# Patient Record
Sex: Female | Born: 1985
Health system: Southern US, Community
[De-identification: ages and names within clinical notes are randomized; demographics above are authoritative.]

## PROBLEM LIST (undated history)

## (undated) DIAGNOSIS — Z85850 Personal history of malignant neoplasm of thyroid: Secondary | ICD-10-CM

## (undated) DIAGNOSIS — F410 Panic disorder [episodic paroxysmal anxiety] without agoraphobia: Secondary | ICD-10-CM

## (undated) DIAGNOSIS — L0293 Carbuncle, unspecified: Secondary | ICD-10-CM

## (undated) DIAGNOSIS — L0292 Furuncle, unspecified: Secondary | ICD-10-CM

## (undated) DIAGNOSIS — Z91018 Allergy to other foods: Secondary | ICD-10-CM

## (undated) DIAGNOSIS — C73 Malignant neoplasm of thyroid gland: Secondary | ICD-10-CM

## (undated) DIAGNOSIS — R002 Palpitations: Secondary | ICD-10-CM

## (undated) DIAGNOSIS — M549 Dorsalgia, unspecified: Secondary | ICD-10-CM

## (undated) DIAGNOSIS — Z8489 Family history of other specified conditions: Secondary | ICD-10-CM

## (undated) DIAGNOSIS — N761 Subacute and chronic vaginitis: Secondary | ICD-10-CM

## (undated) DIAGNOSIS — E559 Vitamin D deficiency, unspecified: Secondary | ICD-10-CM

## (undated) DIAGNOSIS — K5900 Constipation, unspecified: Secondary | ICD-10-CM

## (undated) DIAGNOSIS — K219 Gastro-esophageal reflux disease without esophagitis: Secondary | ICD-10-CM

## (undated) DIAGNOSIS — D649 Anemia, unspecified: Secondary | ICD-10-CM

## (undated) DIAGNOSIS — F419 Anxiety disorder, unspecified: Secondary | ICD-10-CM

## (undated) DIAGNOSIS — T7840XA Allergy, unspecified, initial encounter: Secondary | ICD-10-CM

## (undated) HISTORY — DX: Malignant neoplasm of thyroid gland: C73

## (undated) HISTORY — DX: Carbuncle, unspecified: L02.93

## (undated) HISTORY — DX: Personal history of malignant neoplasm of thyroid: Z85.850

## (undated) HISTORY — DX: Allergy, unspecified, initial encounter: T78.40XA

## (undated) HISTORY — DX: Constipation, unspecified: K59.00

## (undated) HISTORY — DX: Palpitations: R00.2

## (undated) HISTORY — DX: Anxiety disorder, unspecified: F41.9

## (undated) HISTORY — DX: Anemia, unspecified: D64.9

## (undated) HISTORY — DX: Vitamin D deficiency, unspecified: E55.9

## (undated) HISTORY — DX: Subacute and chronic vaginitis: N76.1

## (undated) HISTORY — DX: Furuncle, unspecified: L02.92

## (undated) HISTORY — PX: ANTERIOR CRUCIATE LIGAMENT REPAIR: SHX115

## (undated) HISTORY — DX: Allergy to other foods: Z91.018

## (undated) HISTORY — DX: Dorsalgia, unspecified: M54.9

---

## 2007-07-08 LAB — HM HIV SCREENING LAB: HM HIV Screening: NEGATIVE

## 2007-07-16 ENCOUNTER — Emergency Department (HOSPITAL_COMMUNITY): Admission: EM | Admit: 2007-07-16 | Discharge: 2007-07-16 | Payer: Self-pay | Admitting: Family Medicine

## 2010-02-20 ENCOUNTER — Ambulatory Visit: Payer: Self-pay | Admitting: Sports Medicine

## 2010-02-20 DIAGNOSIS — M25569 Pain in unspecified knee: Secondary | ICD-10-CM | POA: Insufficient documentation

## 2010-02-20 DIAGNOSIS — M79609 Pain in unspecified limb: Secondary | ICD-10-CM | POA: Insufficient documentation

## 2010-02-20 DIAGNOSIS — E663 Overweight: Secondary | ICD-10-CM | POA: Insufficient documentation

## 2010-02-26 ENCOUNTER — Ambulatory Visit: Payer: Self-pay | Admitting: Family Medicine

## 2010-03-19 ENCOUNTER — Ambulatory Visit: Payer: Self-pay | Admitting: Family Medicine

## 2010-04-02 ENCOUNTER — Ambulatory Visit: Payer: Self-pay | Admitting: Family Medicine

## 2010-04-16 ENCOUNTER — Ambulatory Visit: Payer: Self-pay | Admitting: Family Medicine

## 2010-04-19 ENCOUNTER — Emergency Department (HOSPITAL_COMMUNITY): Admission: EM | Admit: 2010-04-19 | Discharge: 2010-04-19 | Payer: Self-pay | Admitting: Family Medicine

## 2010-04-26 ENCOUNTER — Ambulatory Visit: Payer: Self-pay

## 2010-04-26 ENCOUNTER — Encounter: Payer: Self-pay | Admitting: Cardiology

## 2010-04-30 ENCOUNTER — Ambulatory Visit: Payer: Self-pay | Admitting: Family Medicine

## 2010-05-16 ENCOUNTER — Ambulatory Visit: Payer: Self-pay | Admitting: Family Medicine

## 2010-06-11 ENCOUNTER — Ambulatory Visit: Payer: Self-pay | Admitting: Family Medicine

## 2010-07-04 ENCOUNTER — Ambulatory Visit: Payer: Self-pay | Admitting: Family Medicine

## 2010-07-11 ENCOUNTER — Emergency Department (HOSPITAL_COMMUNITY): Admission: EM | Admit: 2010-07-11 | Discharge: 2010-07-11 | Payer: Self-pay | Admitting: Emergency Medicine

## 2010-07-18 ENCOUNTER — Ambulatory Visit: Payer: Self-pay | Admitting: Family Medicine

## 2010-08-05 ENCOUNTER — Encounter: Payer: Self-pay | Admitting: Family Medicine

## 2010-08-06 ENCOUNTER — Encounter: Payer: Self-pay | Admitting: Family Medicine

## 2010-08-20 ENCOUNTER — Ambulatory Visit: Payer: Self-pay | Admitting: Family Medicine

## 2010-09-04 ENCOUNTER — Encounter: Payer: Self-pay | Admitting: Family Medicine

## 2010-09-24 ENCOUNTER — Encounter: Payer: Self-pay | Admitting: Family Medicine

## 2010-10-23 ENCOUNTER — Emergency Department (HOSPITAL_COMMUNITY)
Admission: EM | Admit: 2010-10-23 | Discharge: 2010-10-23 | Payer: Self-pay | Source: Home / Self Care | Admitting: Family Medicine

## 2010-10-28 LAB — POCT URINALYSIS DIPSTICK
Bilirubin Urine: NEGATIVE
Ketones, ur: NEGATIVE mg/dL
Protein, ur: 100 mg/dL — AB

## 2010-10-28 LAB — URINE CULTURE: Culture  Setup Time: 201201181335

## 2010-10-28 LAB — POCT PREGNANCY, URINE: Preg Test, Ur: NEGATIVE

## 2010-11-05 NOTE — Assessment & Plan Note (Signed)
Summary: np to see Dr. Youlanda Mighty   Vital Signs:  Patient profile:   25 year old female Height:      64 inches Weight:      247.7 pounds BMI:     42.67  Vitals Entered By: Wyona Almas PHD (Feb 26, 2010 11:45 AM)  History of Present Illness: Assessment:  Spent 60 minutes w/ pt.  Lisa Bolton is participating in the [employer] Loss Challenge together with friend Toni Amend [place].  They have recently started exercising; usual exercise includes 2 days of 45 min cardio and 45-min body pump class 2 X wk.  She eats breakfast only  ~3 days a week, does eat lunch & dinner daily, and seldom snacks.  Daily foods/beverages include water, sandwich for lunch, and frozen yogurt  ~3 X wk.  Med's include Lexapro, MVM with Fe.  24-hr recall was atypical in that she ate more than usual, and snacked while staying up late working on a project: B- none; Snk (10:30 AM)- granola bar, 90-kcal Coke; L (1 PM)- [employer] 6" sub: chx, veg's, vinegar, cheese on whwht,  ~32 Sunchips (280 kcal); Snk (5 PM)- 16 oz Coke; D (7:30 PM)- 2 c pasta in olive oil, 1grilled sausage, salad w/ Caesar & croutons, choc cupcake; Snk (10:30 PM)- coffee w/ crm & sugar; Snk (12 AM)- <1 c "pb pretzel sandwiches."  Estimated intake  ~2500 kcal.  Leni said she usually experiences hunger  ~noon and 5 PM.    Nutrition Diagnosis:  Physical inactivity (NB-2.1) related to wt loss goals as evidenced by reported exercise of 4 X wk.  Excessive energy intake (NI-1.5) related to expenditure as evidenced by intake of  ~2500 kcal.    Intervention: See Patient Instructions.    Monitoring/Eval: Dietary intake, body weight, and exercise at 2-wk F/U.     Allergies: No Known Drug Allergies   Other Orders: No Charge Patient Arrived (NCPA0) (NCPA0)  Patient Instructions: 1)  Program on Mindful Eating and Living:  Wed, May 25, 7 PM at [employer], [address]   2)  Food Plan:  9 BREAD/STARCH, 4 VEGETABLES, 8 oz MEAT, 3 FRUIT, 2 MILK, 5  FATS. 3)  Design some menus using these food plans.  Bring back to your next appt for review/modification.  4)  Appt:  June 7 at 2:00 PM.   5)  At subsequent appts, we will explore emotional eating, physical activity, and appetite management.

## 2010-11-05 NOTE — Procedures (Signed)
Summary: summary report  summary report   Imported By: Mirna Mires 04/30/2010 09:56:45  _____________________________________________________________________  External Attachment:    Type:   Image     Comment:   External Document  Appended Document: summary report FAXED TO DR. TISOVEC

## 2010-11-05 NOTE — Assessment & Plan Note (Signed)
Summary: WELLNESS PHYSICAL FOR [facility] FITNESS PROJECT   Vital Signs:  Patient profile:   25 year old female Height:      64 inches Weight:      246 pounds BMI:     42.38 BP sitting:   118 / 77  Vitals Entered By: Lillia Pauls CMA (Feb 20, 2010 9:47 AM)  History of Present Illness: Lisa Bolton is here for SM eval to participate in fitness and wt loss program w [facility] 2002 ACL tear at dance repair done at that time and went well also had a fx of left foot at that time  now 2 weeks into program some weakness left knee and will get pain slt swelling at times  now also has pain in arch of left foot hurts most w squats and lunges  no medical problems noted in past takes Lexapro 10 once daily  last yr program lost 30 lbs   Allergies (verified): No Known Drug Allergies  Physical Exam  General:  Obese,well-nourished,in no acute distress; alert,appropriate and cooperative throughout examination Head:  Normocephalic and atraumatic without obvious abnormalities. No apparent alopecia or balding. Lungs:  Normal respiratory effort, chest expands symmetrically. Lungs are clear to auscultation, no crackles or wheezes. Heart:  Normal rate and regular rhythm. S1 and S2 normal without gallop, murmur, click, rub or other extra sounds. Msk:  excellent strength and full ROM of shoulders full ROM of neck  Knees good quad strength but RT is > LT ACL scar on left both knees show increased laxity but norm lig and cartilage eval  Hip Full ROM and good strength in all planes  Ankles increased laxity but stable ligaments  moderate arches bilat but left foot turns in with gait   Impression & Recommendations:  Problem # 1:  OVERWEIGHT (ICD-278.02)  she is a good candidate for the fitness and WT loss program  very  motivated  good general health  Orders: [employer] [phone])  Problem # 2:  KNEE PAIN, LEFT (ICD-719.46)  need to work strength for left knee  ACL surgery successful but  still relatively weaker  given step up/down program  Orders: Sports Insoles (L3510)  Problem # 3:  FOOT PAIN, LEFT (ICD-729.5)  remote fracture still too lax  will give 1 foot cone exercises  use sports insoles  Orders: Sports Insoles (L3510)

## 2010-11-05 NOTE — Assessment & Plan Note (Signed)
Summary: Seeing Sykes @ 2:30 / [facility]   Vital Signs:  Patient profile:   25 year old female Height:      64 inches Weight:      247.5 pounds BMI:     42.64  Vitals Entered By: Wyona Almas PHD (August 20, 2010 2:41 PM)  History of Present Illness: Assessment:  Spent 30 min w/ pt.  Lisa Bolton has been doing Body Pump twice a week, but no other exercise.  She has NOT managed to do more home food prep, and continues to eat out most meals.  She did just start eating breakfast again, and has purchased foods for home.  A big part of the problem for Lisa Bolton is that she maintains a very busy social life, and has no interest in coming home to be alone.  Consequently, eating out is the norm.    Nutrition Diagnosis:  Slight progress on physical inactivity (NB-2.1) related to wt loss goals as evidenced by twice weekly ex classes.  No progress on excessive energy intake (NI-1.5) related to expenditure as evidenced by wt gain of almost 3 lb.    Intervention:  See Patient Instructions.    Monitoring/Eval:  Wt chk every other week through end of year.     Allergies: No Known Drug Allergies   Other Orders: No Charge Patient Arrived (NCPA0) (NCPA0)  Patient Instructions: 1)  Eat at home at least 4 X wk and no more than one restaurant meal per Lisa Bolton on weekends.   2)  Limit alcohol to two drinks per outing.   3)  Eat at least 3 meals and 1-2 snacks per Lisa Bolton.  4)  Exercise goal:  Body Pump class 2 X wk and cycling 20 min 3 X wk.  (Go for the bonus minutes once a week.) 5)  Record your minutes of exercise each Lisa Bolton.   6)  Continue to be as active as you can be during the Lisa Bolton.   7)  Wt chk every other week through end of year.     Orders Added: 1)  No Charge Patient Arrived (NCPA0) [NCPA0]

## 2010-11-05 NOTE — Assessment & Plan Note (Signed)
Summary: to see Lisa Bolton at 1:30pm/kh   Vital Signs:  Patient profile:   25 year old female Height:      64 inches Weight:      243.7 pounds BMI:     41.98  Vitals Entered By: Wyona Almas PHD (April 30, 2010 1:42 PM)  History of Present Illness: Assessment:  Spent 30 minutes with pt.  Lisa Bolton said she struggles with food cravings, especially for sweets.  We reviewed HAALT, and discussed how to handle urges for inappropriate eating.  Lisa Bolton is concerned about upcoming trip to the beach, then to [place] (mission trip) with respect to food choices and not being in full control of the day's eating or schedule.  I again emphasized the need for conscious decisions, perhaps even more important when travelling.    Nutrition Diagnosis: No further progress on physical inactivity (NB-2.1) related to wt loss goals as evidenced by exercise limited to walking  ~3 X wk.  No further progress on excessive energy intake (NI-1.5) related to expenditure as evidenced by no further wt loss.  Intervention:  See Patient Instructions.    Monitoring/Eval:  Dietary intake, body weight, and exercise at 2-wk F/U.    Allergies: No Known Drug Allergies   Other Orders: No Charge Patient Arrived (NCPA0) (NCPA0)  Patient Instructions: 1)  CONSCIOUS CHOICES for both diet and exercise.   2)  Beware BEVERAGE CHOICES.  Alcoholic beverages:  Alternate alcoholic drinks with water or non-caloric beverages.   3)  The 3 Ds of dealing with urges:  Delay, Distract, and DIstance.   4)  THEN:  Determine:  What's happening now, and what are my options? 5)  Lastly:  Decide on your action plan. 6)  Review algorithm (provided today) for food decisions as needed.     7)  Exercise:  Keep up at least 3 X wk, and remember that 5-6 X wk is even better.   8)  Daily food records! 9)  Follow-up appt:  Aug 11 @ 3:30 & 3:45 in [facility].

## 2010-11-05 NOTE — Assessment & Plan Note (Signed)
Summary: Seeing Sykes @ 1:30 / [facility]   Vital Signs:  Patient profile:   25 year old female Height:      64 inches Weight:      243.4 pounds BMI:     41.93  Vitals Entered By: Wyona Almas PHD (April 16, 2010 1:43 PM)  History of Present Illness: Assessment:  Meiko's motivation seems to be waning a bit.  She did not eat breakfast since last nutr appt, and she has stopped recording food intake.  She has bought some low-fat dressing, but hasn't tried it yet.  Usual exercise includes 45 min body pump 2 X wk and aerobics class 1-2 X wk.  Shaquita lives alone, and we talked of how much support her friend and co-worker Toni Amend gets from her family, and that perhaps Toni Amend can try to be even more supportive of Eupha in these diet and exercise efforts.  Both Marisal and Toni Amend said they are fast eaters, and through that discussion, I surmised that there is a fair amount of mindless eating going on.    Nutrition Diagnosis:  No further progress on physical inactivity (NB-2.1) related to wt loss goals as evidenced by exercise classes 3-4 X wk.  No further progress on excessive energy intake (NI-1.5) related to expenditure as evidenced by additional no further wt loss.  Intervention:  See Patient Instructions.    Monitoring/Eval:  Dietary intake, body weight, and exercise at 2-wk F/U.      Allergies: No Known Drug Allergies   Other Orders: No Charge Patient Arrived (NCPA0) (NCPA0)  Patient Instructions: 1)  On your upcoming trips, (a) Remember to be MINDFUL and make very CONSCIOUS food choices for yourself; and (b) Plan each day with the idea of how you'll fit in some exercise.  Talk to your travel companions about doing some walking.  Morning walks are going to be the best way to ensure that activity happens.   2)  Try a low-fat dressing before next appt. 3)  PLAN AHEAD for meals and snacks.    4)  SLOW DOWN when eating.  Fork down between bites.  Pause before your meal and pause at least once  for 30 seconds during the meal.   5)  Emphasis this week:  Conscious choices and conscious eating! 6)  Next appt Tuesday, July 26, 1:30.

## 2010-11-05 NOTE — Assessment & Plan Note (Signed)
Summary: to see sykes/eo  Nurse Visit   Vital Signs:  Patient profile:   25 year old female Height:      64 inches Weight:      243.7 pounds BMI:     41.98  Vitals Entered By: Wyona Almas PHD (April 02, 2010 11:40 AM)  History of Present Illness: Assessment:  Quintana and Toni Amend have been keeping up exercise well, and are planning to start the running training that precedes the Women's Only 5 K in Oct.  Food choices have been more challenging, however.  Obstacles have included foods brought in by coworkers left in the break room, eating out with friends at places where it's hard to make healthy choices, still wanting foods like fried chicken and [place] fries, and getting sick of salads.  Discussion today focused a lot on getting full satisfaction from meals by making them balanced and complete.  We also talked of figuring out what foods and eating patterns support good choices by preventing excessive appetite.  Both Toni Amend and Kellyann know which foods are better for their health and weight loss efforts.  Where they need help is in translating that knowledge to behavior.  They both are tracking Wt Watchers points only selectively.  Food records emailed to me last week reflect problems indicated above, i.e., fried foods, social eating, meals without veg's.    Nutrition Diagnosis:  Continued progress on physical inactivity (NB-2.1) related to wt loss goals as evidenced by consistent participation in exercise classes.  Some progress on excessive energy intake (NI-1.5) related to expenditure as evidenced by additional wt loss of 4.2 lb.    Intervention:  See Patient Instructions.    Monitoring/Eval:  Dietary intake, body weight, and exercise at 2-wk F/U.       Patient Instructions: 1)  Track your breakfast intake:  What, how much, and what time.  Also write down the time at which you first notice hunger.   2)  Pay attention also to your snacks, and how they affect your appetite and in  general how you feel after eating.   3)  Some advance preparation of fruit and veg's may be the difference between a snack you are happy about or not so happy about.   4)  Try a low-fat salad dressing.  If you find one you like, bring it to work for lunches in [employer].   5)  Include a veg and/or fruit at both lunch and dinner.  6)  (Free [place] fries are never free.) 7)  Use your microwave for any frozen or fresh veg's.  Keep frozen veg's on hand.   8)  The best foods for helping control appetite are those that are less processed.   9)  Salads in restaurants:  Try dipping your fork in the dressing BEFORE you spear your lettuce.   10)  Keep up the good work on your exercise!   Allergies: No Known Drug Allergies  Orders Added: 1)  No Charge Patient Arrived (NCPA0) [NCPA0]

## 2010-11-05 NOTE — Assessment & Plan Note (Signed)
Summary: Seeing Lisa Bolton @ 11:30 / [facility]   Vital Signs:  Patient profile:   25 year old female Height:      64 inches Weight:      243.5 pounds BMI:     41.95  Vitals Entered By: Wyona Almas PHD (March 19, 2010 12:32 PM)  History of Present Illness: Assessment:  Eleanore and Courtney have started Weight Watchers (38 pts for Virdie and 40 pts for [employer]), and have continued to eat most meals together M-F.  They both say that weekends are hard to stay on track b/c schedules are so variable and there are so many social situations.  We discussed at length Courtney's choice of fried Oreos on Sunday when she was at a bar with friends.  Reviewed the concepts of hyperpalatable foods being irresistable; the importance of satisfxn from foods; awareness, conscious food choices, and conscious eating; and the importance of structure.    Nutrition Diagnosis:  Cannot evaluate physical inactivity (NB-2.1) related to wt loss goals b/c we spent the whole hour talking re. food choices.  Some progress on excessive energy intake (NI-1.5) related to expenditure as evidenced by wt loss of 4.2 lb.    Intervention:  See Patient Instructions.    Monitoring/Eval:  Dietary intake, body weight, and exercise at 2-wk F/U.     Allergies: No Known Drug Allergies   Other Orders: No Charge Patient Arrived (NCPA0) (NCPA0)  Patient Instructions: 1)  Remember the importance of getting satisfaction from your food as you work on improving your food choices.   2)  The 3 questions of a good food decision:  (1) How hungry am I?; (2) What am I in the mood for?; and (3) What's good for me? 3)  Daily food record, including what, what time, and how much you eat.  Email to Dr. Gerilyn Pilgrim in about a week.   4)  Keeping a food record will help create awareness, which will help to make your food choices as well as eating itself more CONSCIOUS, i.e., slowing down eating speed.   5)  Build in structure to the weekends, i.e., 3 meals/day.  Plan  meal times, including breakfast within the first hour of being up.   6)  Books:  Meet at least twice a week for the next 2 weeks to discuss G Roth's book and the [employer].  Be prepared to discuss when you come to your follow-up appt in 2 wk.   7)  Two last suggestions:  (a) Keep in mind the strategies of the food industry as they make products specifically designed to make [place] overeat!; and (b) Try to view these lifestyle changes as learning to take care of yourself, for which the reward is feeling better, looking better, and performing better - empowerment!

## 2010-11-05 NOTE — Assessment & Plan Note (Signed)
Summary: nutr. appt/eo   Vital Signs:  Patient profile:   25 year old female Height:      64 inches Weight:      243.3 pounds BMI:     41.91  Vitals Entered By: Wyona Almas PHD (May 16, 2010 3:40 PM)  History of Present Illness: Assessment:  [place] just got [place] from [place] where meals were almost exclusively fried meats/fish and starches.  They walked a lot and even went jogging a few times, which likely helped keep her weight stable.  She will get back to her exercise routine beginning tonight when she goes to her 5K training group.  Of note, Zurri ate breakfast each day while away, and she feels that that was helpful in her weight maintenance despite the poor food choices available.    Nutrition Diagnosis:  Progress on physical inactivity (NB-2.1) related to wt loss goals as evidenced by daily walking and some running while in [place] last week.  No further progress on excessive energy intake (NI-1.5) related to expenditure as evidenced by no further wt loss.  Intervention:  See Patient Instructions.    Monitoring/Eval:  Dietary intake, body weight, and exercise at 3-wk F/U.    Allergies: No Known Drug Allergies   Other Orders: No Charge Patient Arrived (NCPA0) (NCPA0)  Patient Instructions: 1)  Exercise opportunities during the day:  Make an even greater commitment to consciously look for ways to MOVE.   2)  Get back into your exercise routine, including Body Pump class.   3)  At your next appt, bring a 3-day food record.   4)  BREAKFAST daily!  If you use cereal, look for at least 5 g fiber per serving.  Remember it does not need to be traditional breakfast food.  TASTE PREFERENCES ARE LEARNED.   5)  Weigh to Wellness class is Sept 13 - Oct 18, Tuesdays 5:30-6:30.   6)  Please schedule an appt for 11:30 with Dr. Gerilyn Pilgrim (11:45 on Nurse schedule) Aug 30.

## 2010-11-05 NOTE — Miscellaneous (Signed)
Summary: Weight Check  Clinical Lists Changes  Observations: Added new observation of WEIGHT: 244.7 lb (08/05/2010 12:19)

## 2010-11-05 NOTE — Assessment & Plan Note (Signed)
Summary: [facility] / [facility]   Vital Signs:  Patient profile:   25 year old female Height:      64 inches Weight:      244.0 pounds BMI:     42.03  Vitals Entered By: Wyona Almas PHD (July 04, 2010 2:06 PM)  History of Present Illness: Assessment:  Colie and Toni Amend went to the beach with 10 other women last week for Courtney's bachelorette party.  They said they started off pretty well, having bought some healthy foods.  They ended up eating a lot of salty snack foods as well as some sweets, and drank more alcohol than planned, all of which were abundantly available.  They ran one day there.  We talked about what they might have done differently, and how much better they would have felt had they made specific plans for food and exercise.  They will both be participating in the Women's Only 5K this Saturday.  We talked some about "social eating guidelines," which Rooney did not remember to do from last appt, and about the importance of empowerment and a vision of success.    Nutrition Diagnosis:  Regression on physical inactivity (NB-2.1) related to being sick as evidenced by no reported exercise for the past several days.  No further progress on excessive energy intake (NI-1.5) related to expenditure as evidenced by reported intake on recent beach trip as well as by wt gain of  ~1 lb.    Intervention:  See Patient Instructions.    Monitoring/Eval:  Dietary intake, body weight, and exercise at 2-wk F/U.   Allergies: No Known Drug Allergies   Other Orders: No Charge Patient Arrived (NCPA0) (NCPA0)  Patient Instructions: 1)  Next road trip:  (A) Plan foods you'll bring with you to cover at least the first 24 hrs.  Think about healthy snacks that will satisfy, i.e., string cheese, yogurt, fruit, pretzels, nuts & seeds in individual portions, envelopes of almond/peanut butter ([employer]) with crackers/rice cakes; (B) Plan and follow-thru with exercise - especially in the  morning.   2)  Breakfast daily.   3)  Remember the 3 Ds of avoiding craving:  Delay, Distract, and Distance.   4)  Get back to NUTRITIONAL balance and more even food distribution thru the day.   5)  PLAN AHEAD.   6)  Email me with 2 or 3 "social eating guidelines."  7)  Read the two handouts on self-acceptance and success strategies provided today, and email me with your observations, thoughts, comments, feelings.   8)  Please schedule an appt for Oct 13.  9)

## 2010-11-05 NOTE — Assessment & Plan Note (Signed)
Summary: nutr. appt/eo   Vital Signs:  Patient profile:   25 year old female Height:      64 inches Weight:      240.9 pounds BMI:     41.50  Vitals Entered By: Wyona Almas PHD (July 18, 2010 12:15 PM)  History of Present Illness: Assessment:  Lisa Bolton completed the Women's Only 5K on Oct 1, but has not exercised since then.  She sounded motivated to get back on track today, however, and has identified a new goal:  the [employer] in [place].  We discussed the importance of having that long-term goal and of setting short-term behavioral goals.  Lisa Bolton remembered being most successful with wt loss when she was preparing more food at home, and eating regularly.  She plans to grocery-shop today, and get back into more home food preparation.    Nutrition Diagnosis:  Regression on physical inactivity (NB-2.1) related to wt loss goals as evidenced by no exercise in almost 2 wks.  Good progress on excessive energy intake (NI-1.5) related to expenditure as evidenced by wt loss of almost 4 lb.    Intervention:  See Patient Instructions.    Monitoring/Eval:  Wt chk in 3 wks.    Allergies: No Known Drug Allergies   Other Orders: No Charge Patient Arrived (NCPA0) (NCPA0)  Patient Instructions: 1)  Future incentive:  [employer] in March. Remember, the more you lose now, the less there is to carry over 10K.   2)  Goals:  Running or other exercise:  5 X wk.   3)  3 REAL meals/day.   4)  Veg's 2 X day.   5)  Fried foods limited to 1 X wk.   6)  Call or email Jeannie with your last goal related to alcohol consumption once you've decided how you want to structure it.

## 2010-11-05 NOTE — Assessment & Plan Note (Signed)
Summary: Seeing Lisa Bolton / [facility]   Vital Signs:  Patient profile:   25 year old female Height:      64 inches Weight:      243.3 pounds BMI:     41.91  Vitals Entered By: Wyona Almas PHD (June 11, 2010 10:13 AM)  History of Present Illness: Assessment:  Spent 30 minutes with pt.  Lisa Bolton has continued to exercise regularly, going to body pump class and participating in the Women's Only 5K training.  She started keeping food records (WW points), and lost 4 lb that week, but has since fallen off that practice.  She identifies social eating as her main obstacle to wt loss.  For example, yesterday, she stopped by a friend's house, who was having a cook-out, right before going home to her brother's birthday dinner (see recall).  We discussed Chimere's need for boundaries around her food (and drink) choices when out with friends, including getting home earlier so she will not sleep till 11 AM on a Sat or Sun.  24-hr recall suggests an intake of  ~2000 kcal: (up  ~11 AM) B- none; Snk- none; L (12:30 PM)- grilled chx salad w/ 2 tbsp ranch, 4 club crackers, water; Snk- none; Snk (5 PM)- hotdog on bun w/ k & m, 6 oz sangria; D (7:30 PM)- chsbgr on bun w/ k & m, let, tom, on; 1.5 c mac & chs, 1 c broc & chs casserole, 1 slc ice cream cake, water.   Nutrition Diagnosis:  Stable progress on physical inactivity (NB-2.1) related to wt loss goals as evidenced by reported exercise of several days a week.  No further progress on excessive energy intake (NI-1.5) related to expenditure as evidenced by no further wt loss.  Intervention:  See Patient Instructions.    Monitoring/Eval:  Dietary intake, body weight, and exercise at 3-wk F/U.    Allergies: No Known Drug Allergies   Other Orders: No Charge Patient Arrived (NCPA0) (NCPA0)  Patient Instructions: 1)  Talk with Toni Amend about, think about, write about your self-image as empowered, capable, and disciplined to make food choices that are in your best  interest.   2)  Come up with 1-3 social eating "guidelines" for yourself.   3)  Daily food records! 4)  Continue exercising as you have been doing.

## 2010-11-07 NOTE — Miscellaneous (Signed)
Summary: Weight Check  Clinical Lists Changes  Observations: Added new observation of WEIGHT: 243.9 lb (09/04/2010 17:05)

## 2010-11-18 ENCOUNTER — Other Ambulatory Visit: Payer: Self-pay | Admitting: Obstetrics and Gynecology

## 2010-11-18 ENCOUNTER — Other Ambulatory Visit (HOSPITAL_COMMUNITY)
Admission: RE | Admit: 2010-11-18 | Discharge: 2010-11-18 | Disposition: A | Discharge status: Home / Self Care | Payer: 59 | Source: Ambulatory Visit | Attending: Obstetrics and Gynecology | Admitting: Obstetrics and Gynecology

## 2010-11-18 DIAGNOSIS — Z01419 Encounter for gynecological examination (general) (routine) without abnormal findings: Secondary | ICD-10-CM | POA: Insufficient documentation

## 2010-11-18 DIAGNOSIS — Z113 Encounter for screening for infections with a predominantly sexual mode of transmission: Secondary | ICD-10-CM | POA: Insufficient documentation

## 2012-07-14 ENCOUNTER — Other Ambulatory Visit (HOSPITAL_COMMUNITY): Payer: Self-pay | Admitting: Gynecology

## 2012-07-14 DIAGNOSIS — E282 Polycystic ovarian syndrome: Secondary | ICD-10-CM

## 2012-07-15 ENCOUNTER — Other Ambulatory Visit: Payer: Self-pay | Admitting: Gynecology

## 2012-07-16 ENCOUNTER — Ambulatory Visit (HOSPITAL_COMMUNITY)
Admission: RE | Admit: 2012-07-16 | Discharge: 2012-07-16 | Disposition: A | Discharge status: Home / Self Care | Payer: 59 | Source: Ambulatory Visit | Attending: Gynecology | Admitting: Gynecology

## 2012-07-16 DIAGNOSIS — E282 Polycystic ovarian syndrome: Secondary | ICD-10-CM | POA: Insufficient documentation

## 2012-07-16 DIAGNOSIS — E079 Disorder of thyroid, unspecified: Secondary | ICD-10-CM | POA: Insufficient documentation

## 2012-07-22 ENCOUNTER — Other Ambulatory Visit: Payer: Self-pay | Admitting: Gynecology

## 2012-07-22 DIAGNOSIS — E079 Disorder of thyroid, unspecified: Secondary | ICD-10-CM

## 2012-08-03 ENCOUNTER — Ambulatory Visit
Admission: RE | Admit: 2012-08-03 | Discharge: 2012-08-03 | Disposition: A | Discharge status: Home / Self Care | Payer: 59 | Source: Ambulatory Visit | Attending: Gynecology | Admitting: Gynecology

## 2012-08-03 ENCOUNTER — Other Ambulatory Visit (HOSPITAL_COMMUNITY)
Admission: RE | Admit: 2012-08-03 | Discharge: 2012-08-03 | Disposition: A | Discharge status: Home / Self Care | Payer: 59 | Source: Ambulatory Visit | Attending: Diagnostic Radiology | Admitting: Diagnostic Radiology

## 2012-08-03 DIAGNOSIS — E079 Disorder of thyroid, unspecified: Secondary | ICD-10-CM

## 2012-08-03 DIAGNOSIS — E049 Nontoxic goiter, unspecified: Secondary | ICD-10-CM | POA: Insufficient documentation

## 2012-08-04 ENCOUNTER — Encounter (INDEPENDENT_AMBULATORY_CARE_PROVIDER_SITE_OTHER): Payer: Self-pay | Admitting: General Surgery

## 2012-08-09 ENCOUNTER — Ambulatory Visit (INDEPENDENT_AMBULATORY_CARE_PROVIDER_SITE_OTHER): Payer: Commercial Managed Care - PPO | Admitting: General Surgery

## 2012-08-09 ENCOUNTER — Encounter (INDEPENDENT_AMBULATORY_CARE_PROVIDER_SITE_OTHER): Payer: Self-pay | Admitting: General Surgery

## 2012-08-09 VITALS — BP 122/70 | HR 72 | Temp 97.6°F | Resp 16 | Ht 64.0 in | Wt 263.2 lb

## 2012-08-09 DIAGNOSIS — E041 Nontoxic single thyroid nodule: Secondary | ICD-10-CM

## 2012-08-09 NOTE — Progress Notes (Signed)
Patient ID: Lisa Bolton, female   DOB: 12-21-1985, 26 y.o.   MRN: [id number]  Chief Complaint  Patient presents with  . Thyroid Nodule    HPI Lisa Bolton is a 26 y.o. female.  She is referred by Dr. Teodora Medici for evaluation of a right thyroid nodule. Her primary care physician is Dr. Luan Pulling Tisovic.  The patient has no prior history of thyroid problems and really has no pressure symptoms or pain. Recent physical exam and Dr. Corwin Levins office revealed a palpable nodule on the right side. Thyroid function tests are normal. Ultrasound shows a 2.8 cm, solitary, mostly solid right thyroid mass. There is no adenopathy. Image guided biopsy shows Hurthle cells, but no malignancy noted.  She has no history of radiation therapy to the neck. She has no family history of thyroid cancer or multiple endocrine neoplasia is. Other medical problems are obesity and mild panic attacks. She works at [employer]. HPI  Past Medical History  Diagnosis Date  . Thyroid nodule   . Vaginal discharge     chronic  . Recurrent boils     on vulva  . Anemia   . Chest pain     Past Surgical History  Procedure Date  . Anterior cruciate ligament repair 2002 - approximate    left    History reviewed. No pertinent family history.  Social History History  Substance Use Topics  . Smoking status: Never Smoker   . Smokeless tobacco: Never Used  . Alcohol Use: Not on file    No Known Allergies  No current outpatient prescriptions on file.    Review of Systems Review of Systems  Constitutional: Negative for fever, chills and unexpected weight change.  HENT: Negative for hearing loss, congestion, sore throat, trouble swallowing and voice change.   Eyes: Negative for visual disturbance.  Respiratory: Negative for cough and wheezing.   Cardiovascular: Negative for chest pain, palpitations and leg swelling.  Gastrointestinal: Negative for nausea, vomiting, abdominal pain, diarrhea, constipation, blood in  stool, abdominal distention and anal bleeding.  Genitourinary: Negative for hematuria, vaginal bleeding and difficulty urinating.  Musculoskeletal: Negative for arthralgias.  Skin: Negative for rash and wound.  Neurological: Negative for seizures, syncope and headaches.  Hematological: Negative for adenopathy. Does not bruise/bleed easily.  Psychiatric/Behavioral: Negative for confusion.    Blood pressure 122/70, pulse 72, temperature 97.6 F (36.4 C), temperature source Temporal, resp. rate 16, height 5\' 4"  (1.626 m), weight 263 lb 4 oz (119.409 kg).  Physical Exam Physical Exam  Constitutional: She is oriented to person, place, and time. She appears well-developed and well-nourished. No distress.  HENT:  Head: Normocephalic and atraumatic.  Nose: Nose normal.  Mouth/Throat: No oropharyngeal exudate.  Eyes: Conjunctivae normal and EOM are normal. Pupils are equal, round, and reactive to light. Left eye exhibits no discharge. No scleral icterus.  Neck: Neck supple. No JVD present. No tracheal deviation present. No thyromegaly present.       Easily palpable right thyroid nodule. At least 2 cm in size. Very smooth but very firm. Moves with swallowing. No other mass. No adenopathy.  Cardiovascular: Normal rate, regular rhythm, normal heart sounds and intact distal pulses.   No murmur heard. Pulmonary/Chest: Effort normal and breath sounds normal. No respiratory distress. She has no wheezes. She has no rales. She exhibits no tenderness.  Abdominal: Soft. Bowel sounds are normal. She exhibits no distension and no mass. There is no tenderness. There is no rebound and no guarding.  Obese.  Musculoskeletal: She exhibits no edema and no tenderness.  Lymphadenopathy:    She has no cervical adenopathy.  Neurological: She is alert and oriented to person, place, and time. She exhibits normal muscle tone. Coordination normal.  Skin: Skin is warm. No rash noted. She is not diaphoretic. No  erythema. No pallor.  Psychiatric: She has a normal mood and affect. Her behavior is normal. Judgment and thought content normal.    Data Reviewed Dr. Measures office notes, imaging studies, cytopathology report, lab tests.  Assessment    Solitary, solid, nonfunctioning nodule right thyroid lobe. In suturing patient's age, ultrasound characteristics, solitary nature, and cytopathology report, advised elective right thyroid lobectomy.    Plan    We had a very long discussion with the patient and one of her friends. We talked about the anatomy of the nodule, the differential diagnosis, low but definite risk of malignancy. We talked about surgery techniques and surveillance. We talked about hemithyroidectomy And total thyroidectomy at a later date if this turned out to be cancer. We talked about risks, especially parathyroid and recurrent laryngeal nerve damage and its consequences.  At this point in time she is leaning toward having a right thyroidectomy before Christmas. She wants to discuss this with Dr. Neale Burly,  her primary care physician. I told her this was not an emergency and she can call  back when she was ready to make a decision to proceed with the surgical intervention.       Angelia Mould. Derrell Lolling, M.D., [facility] General and Minimally invasive Surgery Breast and Colorectal Surgery Office:   [phone] Pager:   [phone]  08/09/2012, 9:31 AM

## 2012-08-09 NOTE — Patient Instructions (Addendum)
Your recent evaluation shows a 2.8 cm solid nodule in the right thyroid lobe. This is a solitary nodule. Your thyroid hormone levels are normal. The needle biopsy shows Hurthle cells.  There is a small chance that this may be a low-grade malignancy.  You have been advised to undergo right thyroid lobectomy.  You are going to check with your primary care physician and call me back when she make your decision.      Thyroidectomy Thyroidectomy is the removal of part or all of your thyroid gland. Your thyroid gland is a butterfly-shaped gland at the base of your neck. It produces a substance called thyroid hormone, which regulates the physical and chemical processes that keep your body functioning and make energy available to your body (metabolism). The amount of thyroid gland tissue that is removed during a thyroidectomy depends on the reason for the procedure. Typically, if only a part of your gland is removed, enough thyroid gland tissue remains to maintain normal function. If your entire thyroid gland is removed or if the amount of thyroid gland tissue remaining is inadequate to maintain normal function, you will need life-long treatment with thyroid hormone on a daily basis. Thyroidectomy maybe performed when you have the following conditions:  Thyroid nodules. These are small, abnormal collections of tissue that form inside the thyroid gland. If these nodules begin to enlarge at a rapid rate, a sample of tissue from the nodule is taken through a needle and examined (needle biopsy). This is done to determine if the nodules are cancerous. Depending on the outcome of this exam, thyroidectomy may be necessary.  Thyroid cancer.  Goiter, which is an enlarged thyroid gland. All or part of the thyroid gland may be removed if the gland has become so large that it causes difficulty breathing or swallowing.  Hyperthyroidism. This is when the thyroid gland produces too much thyroid hormone.  Hypothyroidism can cause symptoms of fluctuating weight, intolerance to heat, irritability, shortness of breath, and chest pain. LET YOUR CAREGIVER KNOW ABOUT:   Allergies to food or medicine.  Medicines that you are taking, including vitamins, herbs, eyedrops, over-the-counter medicines, and creams.  Previous problems you have had with anesthetics or numbing medicines.  History of bleeding problems or blood clots.  Previous surgeries you have had.  Other health problems, including diabetes and kidney problems, you have had.  Possibility of pregnancy, if this applies. BEFORE THE PROCEDURE   Do not eat or drink anything, including water, for at least 6 hours before the procedure.  Ask your caregiver whether you should stop taking certain medicines before the day of the procedure. PROCEDURE  There are different ways that thyroidectomy is performed. For each type, you will be given a medicine to make you sleep (general anesthetic). The three main types of thyroidectomy are listed as follows:  Conventional thyroidectomy A cut (incision) in the center portion of your lower neck is made with a scalpel. Muscles below your skin are separated to gain access to your thyroid gland. Your thyroid gland is dissected from your windpipe (trachea). Often a drain is placed at the incision site to drain any blood that accumulates under the skin after the procedure. This drain will be removed before you go home. The wound from the incision should heal within 2 weeks.  Endoscopic thyroidectomy Small incisions are made in your lower neck. A small instrument (endoscope) is inserted under your skin at the incision sites. The endoscope used for thyroidectomy consists of 2 flexible tubes.  Inside one of the tubes is a video camera that is used to guide the Careers adviser. Tools to remove the thyroid gland, including a tool to cut the gland (dissectors) and a suction device, are inserted through the other tube. The surgeon  uses the dissectors to dissect the thyroid gland from the trachea and remove it.  Robotic thyroidectomy This procedure allows your thyroid gland to be removed through incisions in your armpit, your chest, or high in your neck. Instruments similar to endoscopes provide a 3-dimensional picture of the surgical site. Dissecting instruments are controlled by devices similar to joysticks. These devices allow more accurate manipulation of the instruments. After the blood supply to the gland is removed, the gland is cut into several pieces and removed through the incisions. RISKS AND COMPLICATIONS Complications associated with thyroidectomy are rare, but they can occur. Possible complications include:  A decrease in parathyroid hormone levels (hypoparathyroidism) Your parathyroid glands are located close behind your thyroid gland. They are responsible for maintaining calcium levels inthe body. If they are damaged or removed, levels of calcium in the blood become low and nerves become irritable, which can cause muscle spasms. Medicines are available to treat this.  Bacterial infection This can often be treated with medicines that kill bacteria (antibiotics).  Damage to your voice box nerves This could cause hoarseness or complete loss of voice.  Bleeding or airway obstruction. AFTER THE PROCEDURE   You will rest in the recovery room as you wake up.  When you first wake up, your throat may feel slightly sore.  You will not be allowed to eat or drink until instructed otherwise.  You will be taken to your hospital room. You will usually stay at the hospital for 1 or 2 nights.  If a drain is placed during the procedure, it usually is removed the next day.  You may have some mild neck pain.  Your voice may be weak. This usually is temporary. Document Released: 03/18/2001 Document Revised: 12/15/2011 Document Reviewed: 12/25/2010 [facility] Patient Information 2013 [place], [place].

## 2012-08-12 ENCOUNTER — Other Ambulatory Visit (INDEPENDENT_AMBULATORY_CARE_PROVIDER_SITE_OTHER): Payer: Self-pay | Admitting: General Surgery

## 2012-08-13 ENCOUNTER — Telehealth (INDEPENDENT_AMBULATORY_CARE_PROVIDER_SITE_OTHER): Payer: Self-pay | Admitting: General Surgery

## 2012-08-13 NOTE — Telephone Encounter (Signed)
Pt called to ask if she should consider attending an out of town wedding on the weekend following her thyroid lobectomy.   Discussed the possible side effects of tiredness, lethargy, swelling, bruising and possibly difficulty swallowing.  Advised her it would be better to decline the invitation so soon after surgery, especially since it is out of town.  She agrees.

## 2012-09-13 ENCOUNTER — Encounter (HOSPITAL_COMMUNITY): Payer: Self-pay | Admitting: Pharmacy Technician

## 2012-09-16 ENCOUNTER — Other Ambulatory Visit (HOSPITAL_COMMUNITY): Payer: Self-pay | Admitting: General Surgery

## 2012-09-16 NOTE — Patient Instructions (Addendum)
20 [name]  09/16/2012   Your procedure is scheduled on:  09/21/12  TUESDAY  Report to [facility] at   0700  AM.    Call this number if you have problems the morning of surgery: [phone]   Remember:   Do not eat food or drink liquids:After Midnight.  Monday NIGHT  .  Take these medicines the morning of surgery with A SIP OF WATER:   CLONAZEPAM IF NEEDED   Do not wear jewelry or make up.  Do not wear lotions, powders, or perfumes. You may wear deodorant.    Do not bring valuables to the hospital.  Contacts, dentures or bridgework may not be worn into surgery.  Leave suitcase in the car. After surgery it may be brought to your room.  For patients admitted to the hospital, checkout time is 11:00 AM the day of    discharge                                    Patients discharged the day of surgery will not be allowed to drive home. If going  home same day of surgery, you must have someone stay with you the first 24 hours at home and arrange for some one to drive you home from hospital.    Special Instructions: See [facility] Preparing for Surgery instruction sheet.    Women do not shave legs or underarms for 12 hours before showers.   Men may shave face morning of surgery.      Please read over the following fact sheets that you were given: MRSA Information               Quavon Keisling  [facility] pre op nurse phone number [phone], call if needed                  FAILURE TO FOLLOW THESE INSTRUCTIONS MAY RESULT IN                CANCELLATION OF YOUR SURGERY.                  Patient Signature :___________________________________

## 2012-09-16 NOTE — Progress Notes (Signed)
EKG 01-12-2012 DR TISOVEC ON CHART LOV 08-12-2012 DR TISOVEC ON CHART

## 2012-09-17 ENCOUNTER — Encounter (HOSPITAL_COMMUNITY): Payer: Self-pay

## 2012-09-17 ENCOUNTER — Encounter (HOSPITAL_COMMUNITY)
Admission: RE | Admit: 2012-09-17 | Discharge: 2012-09-17 | Disposition: A | Discharge status: Home / Self Care | Payer: 59 | Source: Ambulatory Visit | Attending: General Surgery | Admitting: General Surgery

## 2012-09-17 ENCOUNTER — Ambulatory Visit (HOSPITAL_COMMUNITY)
Admission: RE | Admit: 2012-09-17 | Discharge: 2012-09-17 | Disposition: A | Discharge status: Home / Self Care | Payer: 59 | Source: Ambulatory Visit | Attending: General Surgery | Admitting: General Surgery

## 2012-09-17 DIAGNOSIS — Z01818 Encounter for other preprocedural examination: Secondary | ICD-10-CM | POA: Insufficient documentation

## 2012-09-17 DIAGNOSIS — Z01812 Encounter for preprocedural laboratory examination: Secondary | ICD-10-CM | POA: Insufficient documentation

## 2012-09-17 HISTORY — DX: Family history of other specified conditions: Z84.89

## 2012-09-17 HISTORY — DX: Gastro-esophageal reflux disease without esophagitis: K21.9

## 2012-09-17 HISTORY — DX: Panic disorder (episodic paroxysmal anxiety): F41.0

## 2012-09-17 LAB — CBC
HCT: 38.4 % (ref 36.0–46.0)
MCV: 87.1 fL (ref 78.0–100.0)
RDW: 13.8 % (ref 11.5–15.5)
WBC: 6.9 10*3/uL (ref 4.0–10.5)

## 2012-09-17 LAB — HCG, SERUM, QUALITATIVE: Preg, Serum: NEGATIVE

## 2012-09-17 NOTE — Progress Notes (Signed)
Per Dr Acey Lav- will see patient morning of surgery

## 2012-09-17 NOTE — Progress Notes (Signed)
Received old anesthesia record from 09/03 and placed on chart. Patient did state at interview that she had no problems with the anesthesia

## 2012-09-20 MED ORDER — DEXTROSE 5 % IV SOLN
3.0000 g | INTRAVENOUS | Status: AC
  Administered 2012-09-21: 3 g via INTRAVENOUS
  Filled 2012-09-20: qty 3000

## 2012-09-20 NOTE — H&P (Signed)
Lisa Bolton    MRN: [id number]   Description: 26 year old female  Provider: [name], MD  Department: [facility]       Diagnoses     Thyroid nodule   - Primary    241.0           Vitals   BP Pulse Temp Resp Ht Wt    122/70 72 97.6 F (36.4 C) (Temporal) 16 5\' 4"  (1.626 m) 263 lb 4 oz (119.409 kg)    BMI -  45.19 kg/m2                 History and Physical    [name], MD   Patient ID: Lisa Bolton, female   DOB: Aug 29, 1986, 26 y.o.   MRN: [id number]              HPI Lisa Bolton is a 26 y.o. female.  She is referred by Dr. Teodora Medici for evaluation of a right thyroid nodule. Her primary care physician is Dr. Luan Pulling Tisovic.   The patient has no prior history of thyroid problems and really has no pressure symptoms or pain. Recent physical exam and Dr. Corwin Levins office revealed a palpable nodule on the right side. Thyroid function tests are normal. Ultrasound shows a 2.8 cm, solitary, mostly solid right thyroid mass. There is no adenopathy. Image guided biopsy shows Hurthle cells, but no malignancy noted.   She has no history of radiation therapy to the neck. She has no family history of thyroid cancer or multiple endocrine neoplasia is. Other medical problems are obesity and mild panic attacks. She works at [employer].      Past Medical History   Diagnosis  Date   .  Thyroid nodule     .  Vaginal discharge         chronic   .  Recurrent boils         on vulva   .  Anemia     .  Chest pain         Past Surgical History   Procedure  Date   .  Anterior cruciate ligament repair  2002 - approximate       left      History reviewed. No pertinent family history.   Social History History   Substance Use Topics   .  Smoking status:  Never Smoker    .  Smokeless tobacco:  Never Used   .  Alcohol Use:  Not on file      No Known Allergies    No current outpatient prescriptions on file.      Review of Systems    Constitutional: Negative for fever, chills and unexpected weight change.  HENT: Negative for hearing loss, congestion, sore throat, trouble swallowing and voice change.   Eyes: Negative for visual disturbance.  Respiratory: Negative for cough and wheezing.   Cardiovascular: Negative for chest pain, palpitations and leg swelling.  Gastrointestinal: Negative for nausea, vomiting, abdominal pain, diarrhea, constipation, blood in stool, abdominal distention and anal bleeding.  Genitourinary: Negative for hematuria, vaginal bleeding and difficulty urinating.  Musculoskeletal: Negative for arthralgias.  Skin: Negative for rash and wound.  Neurological: Negative for seizures, syncope and headaches.  Hematological: Negative for adenopathy. Does not bruise/bleed easily.  Psychiatric/Behavioral: Negative for confusion.    Blood pressure 122/70, pulse 72, temperature 97.6 F (36.4 C), temperature source Temporal, resp. rate 16, height 5\' 4"  (1.626 m), weight 263  lb 4 oz (119.409 kg).   Physical Exam  Constitutional: She is oriented to person, place, and time. She appears well-developed and well-nourished. No distress.  HENT:   Head: Normocephalic and atraumatic.   Nose: Nose normal.   Mouth/Throat: No oropharyngeal exudate.  Eyes: Conjunctivae normal and EOM are normal. Pupils are equal, round, and reactive to light. Left eye exhibits no discharge. No scleral icterus.  Neck: Neck supple. No JVD present. No tracheal deviation present. No thyromegaly present.       Easily palpable right thyroid nodule. At least 2 cm in size. Very smooth but very firm. Moves with swallowing. No other mass. No adenopathy.  Cardiovascular: Normal rate, regular rhythm, normal heart sounds and intact distal pulses.    No murmur heard. Pulmonary/Chest: Effort normal and breath sounds normal. No respiratory distress. She has no wheezes. She has no rales. She exhibits no tenderness.  Abdominal: Soft. Bowel sounds are  normal. She exhibits no distension and no mass. There is no tenderness. There is no rebound and no guarding.       Obese.  Musculoskeletal: She exhibits no edema and no tenderness.  Lymphadenopathy:    She has no cervical adenopathy.  Neurological: She is alert and oriented to person, place, and time. She exhibits normal muscle tone. Coordination normal.  Skin: Skin is warm. No rash noted. She is not diaphoretic. No erythema. No pallor.  Psychiatric: She has a normal mood and affect. Her behavior is normal. Judgment and thought content normal.    Data Reviewed Dr. Corwin Levins office notes, imaging studies, cytopathology report, lab tests.   Assessment Solitary, solid, nonfunctioning nodule right thyroid lobe. Considering patient's age, ultrasound characteristics, solitary nature, and cytopathology report, advised elective right thyroid lobectomy.   Plan We had a very long discussion with the patient and one of her friends. We talked about the anatomy of the nodule, the differential diagnosis, low but definite risk of malignancy. We talked about surgery techniques and surveillance. We talked about hemithyroidectomy And total thyroidectomy at a later date if this turned out to be cancer. We talked about risks, especially parathyroid and recurrent laryngeal nerve damage and its consequences.   At this point in time she is leaning toward having a right thyroidectomy before Christmas. She wants to discuss this with Dr. Neale Burly,  her primary care physician. I told her this was not an emergency and she can call  back when she was ready to make a decision to proceed with the surgical intervention.       Angelia Mould. Derrell Lolling, M.D., [facility] General and Minimally invasive Surgery Breast and Colorectal Surgery Office:   [phone] Pager:   [phone]

## 2012-09-21 ENCOUNTER — Encounter (HOSPITAL_COMMUNITY): Payer: Self-pay | Admitting: Anesthesiology

## 2012-09-21 ENCOUNTER — Ambulatory Visit (HOSPITAL_COMMUNITY)
Admission: RE | Admit: 2012-09-21 | Discharge: 2012-09-22 | Disposition: A | Discharge status: Home / Self Care | Payer: 59 | Source: Ambulatory Visit | Attending: General Surgery | Admitting: General Surgery

## 2012-09-21 ENCOUNTER — Encounter (HOSPITAL_COMMUNITY)
Admission: RE | Disposition: A | Discharge status: Home / Self Care | Payer: Self-pay | Source: Ambulatory Visit | Attending: General Surgery

## 2012-09-21 ENCOUNTER — Ambulatory Visit (HOSPITAL_COMMUNITY): Payer: 59 | Admitting: Anesthesiology

## 2012-09-21 ENCOUNTER — Encounter (HOSPITAL_COMMUNITY): Payer: Self-pay | Admitting: *Deleted

## 2012-09-21 DIAGNOSIS — C73 Malignant neoplasm of thyroid gland: Secondary | ICD-10-CM

## 2012-09-21 DIAGNOSIS — K219 Gastro-esophageal reflux disease without esophagitis: Secondary | ICD-10-CM | POA: Insufficient documentation

## 2012-09-21 DIAGNOSIS — E041 Nontoxic single thyroid nodule: Secondary | ICD-10-CM | POA: Insufficient documentation

## 2012-09-21 HISTORY — PX: THYROID LOBECTOMY: SHX420

## 2012-09-21 SURGERY — LOBECTOMY, THYROID
Anesthesia: General | Site: Neck | Laterality: Right | Wound class: Clean

## 2012-09-21 MED ORDER — CLONAZEPAM 0.5 MG PO TABS: 0.5000 mg | ORAL_TABLET | Freq: Two times a day (BID) | ORAL | Status: DC | PRN

## 2012-09-21 MED ORDER — ACETAMINOPHEN 10 MG/ML IV SOLN
INTRAVENOUS | Status: AC
  Filled 2012-09-21: qty 100

## 2012-09-21 MED ORDER — ONDANSETRON HCL 4 MG PO TABS: 4.0000 mg | ORAL_TABLET | Freq: Four times a day (QID) | ORAL | Status: DC | PRN

## 2012-09-21 MED ORDER — MIDAZOLAM HCL 5 MG/5ML IJ SOLN
INTRAMUSCULAR | Status: DC | PRN
  Administered 2012-09-21 (×2): 1 mg via INTRAVENOUS

## 2012-09-21 MED ORDER — GLYCOPYRROLATE 0.2 MG/ML IJ SOLN
INTRAMUSCULAR | Status: DC | PRN
  Administered 2012-09-21: .8 mg via INTRAVENOUS

## 2012-09-21 MED ORDER — CEFAZOLIN SODIUM-DEXTROSE 2-3 GM-% IV SOLR
INTRAVENOUS | Status: AC
  Filled 2012-09-21: qty 50

## 2012-09-21 MED ORDER — POTASSIUM CHLORIDE IN NACL 20-0.9 MEQ/L-% IV SOLN
INTRAVENOUS | Status: DC
  Administered 2012-09-21 (×2): via INTRAVENOUS
  Filled 2012-09-21 (×3): qty 1000

## 2012-09-21 MED ORDER — BUPIVACAINE HCL (PF) 0.5 % IJ SOLN
INTRAMUSCULAR | Status: AC
  Filled 2012-09-21: qty 30

## 2012-09-21 MED ORDER — ONDANSETRON HCL 4 MG/2ML IJ SOLN: 4.0000 mg | Freq: Four times a day (QID) | INTRAMUSCULAR | Status: DC | PRN

## 2012-09-21 MED ORDER — BUPIVACAINE-EPINEPHRINE (PF) 0.5% -1:200000 IJ SOLN
INTRAMUSCULAR | Status: AC
  Filled 2012-09-21: qty 10

## 2012-09-21 MED ORDER — LIDOCAINE HCL 4 % MT SOLN
OROMUCOSAL | Status: DC | PRN
  Administered 2012-09-21: 4 mL via TOPICAL

## 2012-09-21 MED ORDER — PROMETHAZINE HCL 25 MG/ML IJ SOLN: 6.2500 mg | INTRAMUSCULAR | Status: DC | PRN

## 2012-09-21 MED ORDER — NEOSTIGMINE METHYLSULFATE 1 MG/ML IJ SOLN
INTRAMUSCULAR | Status: DC | PRN
  Administered 2012-09-21: 4 mg via INTRAVENOUS

## 2012-09-21 MED ORDER — CISATRACURIUM BESYLATE (PF) 10 MG/5ML IV SOLN
INTRAVENOUS | Status: DC | PRN
  Administered 2012-09-21: 2 mg via INTRAVENOUS
  Administered 2012-09-21: 8 mg via INTRAVENOUS

## 2012-09-21 MED ORDER — BUPIVACAINE HCL 0.5 % IJ SOLN
INTRAMUSCULAR | Status: DC | PRN
  Administered 2012-09-21: 3 mL

## 2012-09-21 MED ORDER — DEXAMETHASONE SODIUM PHOSPHATE 10 MG/ML IJ SOLN
INTRAMUSCULAR | Status: DC | PRN
  Administered 2012-09-21: 10 mg via INTRAVENOUS

## 2012-09-21 MED ORDER — CEFAZOLIN SODIUM 1-5 GM-% IV SOLN
INTRAVENOUS | Status: AC
  Filled 2012-09-21: qty 50

## 2012-09-21 MED ORDER — HEPARIN SODIUM (PORCINE) 5000 UNIT/ML IJ SOLN
5000.0000 [IU] | Freq: Three times a day (TID) | INTRAMUSCULAR | Status: DC
  Administered 2012-09-22: 5000 [IU] via SUBCUTANEOUS
  Filled 2012-09-21 (×4): qty 1

## 2012-09-21 MED ORDER — OXYCODONE-ACETAMINOPHEN 5-325 MG PO TABS: 1.0000 | ORAL_TABLET | ORAL | Status: DC | PRN

## 2012-09-21 MED ORDER — MORPHINE SULFATE 2 MG/ML IJ SOLN
2.0000 mg | INTRAMUSCULAR | Status: DC | PRN
  Administered 2012-09-21: 2 mg via INTRAVENOUS
  Filled 2012-09-21: qty 1

## 2012-09-21 MED ORDER — ACETAMINOPHEN 10 MG/ML IV SOLN
INTRAVENOUS | Status: DC | PRN
  Administered 2012-09-21: 1000 mg via INTRAVENOUS

## 2012-09-21 MED ORDER — FENTANYL CITRATE 0.05 MG/ML IJ SOLN
INTRAMUSCULAR | Status: DC | PRN
  Administered 2012-09-21: 100 ug via INTRAVENOUS
  Administered 2012-09-21: 50 ug via INTRAVENOUS
  Administered 2012-09-21 (×2): 100 ug via INTRAVENOUS

## 2012-09-21 MED ORDER — HYDROMORPHONE HCL PF 1 MG/ML IJ SOLN: 0.2500 mg | INTRAMUSCULAR | Status: DC | PRN

## 2012-09-21 MED ORDER — EPHEDRINE SULFATE 50 MG/ML IJ SOLN
INTRAMUSCULAR | Status: DC | PRN
  Administered 2012-09-21: 5 mg via INTRAVENOUS

## 2012-09-21 MED ORDER — PROPOFOL 10 MG/ML IV EMUL
INTRAVENOUS | Status: DC | PRN
  Administered 2012-09-21: 200 mg via INTRAVENOUS

## 2012-09-21 MED ORDER — ARTIFICIAL TEARS OP OINT
TOPICAL_OINTMENT | OPHTHALMIC | Status: DC | PRN
  Administered 2012-09-21: 1 via OPHTHALMIC

## 2012-09-21 MED ORDER — 0.9 % SODIUM CHLORIDE (POUR BTL) OPTIME
TOPICAL | Status: DC | PRN
  Administered 2012-09-21: 1000 mL

## 2012-09-21 MED ORDER — ONDANSETRON HCL 4 MG/2ML IJ SOLN
INTRAMUSCULAR | Status: DC | PRN
  Administered 2012-09-21 (×2): 2 mg via INTRAVENOUS

## 2012-09-21 MED ORDER — CEFAZOLIN SODIUM-DEXTROSE 2-3 GM-% IV SOLR
2.0000 g | Freq: Three times a day (TID) | INTRAVENOUS | Status: DC
  Administered 2012-09-21 – 2012-09-22 (×2): 2 g via INTRAVENOUS
  Filled 2012-09-21 (×3): qty 50

## 2012-09-21 MED ORDER — LACTATED RINGERS IV SOLN
INTRAVENOUS | Status: DC
  Administered 2012-09-21: 1000 mL via INTRAVENOUS

## 2012-09-21 MED ORDER — CISATRACURIUM BESYLATE (PF) 10 MG/5ML IV SOLN
INTRAVENOUS | Status: DC | PRN
  Administered 2012-09-21: 8 mg via INTRAVENOUS

## 2012-09-21 SURGICAL SUPPLY — 45 items
ATTRACTOMAT 16X20 MAGNETIC DRP (DRAPES) ×2 IMPLANT
BENZOIN TINCTURE PRP APPL 2/3 (GAUZE/BANDAGES/DRESSINGS) ×2 IMPLANT
BLADE HEX COATED 2.75 (ELECTRODE) ×2 IMPLANT
BLADE SURG 15 STRL LF DISP TIS (BLADE) ×1 IMPLANT
BLADE SURG 15 STRL SS (BLADE) ×1
CANISTER SUCTION 2500CC (MISCELLANEOUS) ×2 IMPLANT
CHLORAPREP W/TINT 10.5 ML (MISCELLANEOUS) IMPLANT
CLIP TI MEDIUM 6 (CLIP) ×4 IMPLANT
CLIP TI WIDE RED SMALL 6 (CLIP) ×6 IMPLANT
CLOTH BEACON ORANGE TIMEOUT ST (SAFETY) ×2 IMPLANT
DERMABOND ADVANCED (GAUZE/BANDAGES/DRESSINGS) ×1
DERMABOND ADVANCED .7 DNX12 (GAUZE/BANDAGES/DRESSINGS) ×1 IMPLANT
DISSECTOR ROUND CHERRY 3/8 STR (MISCELLANEOUS) ×2 IMPLANT
DRAPE PED LAPAROTOMY (DRAPES) ×2 IMPLANT
DRESSING SURGICEL FIBRLLR 1X2 (HEMOSTASIS) ×1 IMPLANT
DRSG SURGICEL FIBRILLAR 1X2 (HEMOSTASIS) ×2
ELECT REM PT RETURN 9FT ADLT (ELECTROSURGICAL) ×2
ELECTRODE REM PT RTRN 9FT ADLT (ELECTROSURGICAL) ×1 IMPLANT
GAUZE SPONGE 4X4 16PLY XRAY LF (GAUZE/BANDAGES/DRESSINGS) ×2 IMPLANT
GLOVE EUDERMIC 7 POWDERFREE (GLOVE) ×2 IMPLANT
GLOVE SURG ORTHO 8.0 STRL STRW (GLOVE) ×2 IMPLANT
GOWN STRL NON-REIN LRG LVL3 (GOWN DISPOSABLE) ×4 IMPLANT
GOWN STRL REIN XL XLG (GOWN DISPOSABLE) ×4 IMPLANT
KIT BASIN OR (CUSTOM PROCEDURE TRAY) ×2 IMPLANT
NDL SAFETY ECLIPSE 18X1.5 (NEEDLE) ×1 IMPLANT
NEEDLE HYPO 18GX1.5 SHARP (NEEDLE) ×1
NEEDLE HYPO 25X1 1.5 SAFETY (NEEDLE) ×2 IMPLANT
NS IRRIG 1000ML POUR BTL (IV SOLUTION) ×2 IMPLANT
PACK BASIC VI WITH GOWN DISP (CUSTOM PROCEDURE TRAY) ×2 IMPLANT
PENCIL BUTTON HOLSTER BLD 10FT (ELECTRODE) ×2 IMPLANT
SHEARS HARMONIC 9CM CVD (BLADE) ×2 IMPLANT
SPONGE GAUZE 4X4 12PLY (GAUZE/BANDAGES/DRESSINGS) IMPLANT
STAPLER VISISTAT 35W (STAPLE) IMPLANT
STRIP CLOSURE SKIN 1/2X4 (GAUZE/BANDAGES/DRESSINGS) IMPLANT
SUT MNCRL AB 4-0 PS2 18 (SUTURE) ×2 IMPLANT
SUT SILK 2 0 (SUTURE) ×1
SUT SILK 2-0 18XBRD TIE 12 (SUTURE) ×1 IMPLANT
SUT SILK 3 0 (SUTURE) ×1
SUT SILK 3-0 18XBRD TIE 12 (SUTURE) ×1 IMPLANT
SUT VIC AB 3-0 SH 18 (SUTURE) ×2 IMPLANT
SYR BULB IRRIGATION 50ML (SYRINGE) ×2 IMPLANT
SYR CONTROL 10ML LL (SYRINGE) ×2 IMPLANT
TOWEL OR 17X26 10 PK STRL BLUE (TOWEL DISPOSABLE) ×2 IMPLANT
TOWEL OR NON WOVEN STRL DISP B (DISPOSABLE) ×2 IMPLANT
YANKAUER SUCT BULB TIP 10FT TU (MISCELLANEOUS) ×2 IMPLANT

## 2012-09-21 NOTE — Interval H&P Note (Signed)
History and Physical Interval Note:  09/21/2012 9:07 AM  Lisa Bolton  has presented today for surgery, with the diagnosis of right thyroid nodule  The goals and the  various methods of treatment have been discussed with the patient and family. After consideration of risks, benefits and other options for treatment, the patient has consented to  Procedure(s) (LRB) with comments: THYROID LOBECTOMY (Right) - Right Thyroid Lobectomy as a surgical intervention .  The patient's history has been reviewed, patient examined today, no change in status, stable for surgery.  I have reviewed the patient's chart and labs.  Questions were answered to the patient's satisfaction.     [name]

## 2012-09-21 NOTE — Anesthesia Postprocedure Evaluation (Signed)
  Anesthesia Post-op Note  Patient: Lisa Bolton  Procedure(s) Performed: Procedure(s) (LRB): THYROID LOBECTOMY (Right)  Patient Location: PACU  Anesthesia Type: General  Level of Consciousness: awake and alert   Airway and Oxygen Therapy: Patient Spontanous Breathing  Post-op Pain: mild  Post-op Assessment: Post-op Vital signs reviewed, Patient's Cardiovascular Status Stable, Respiratory Function Stable, Patent Airway and No signs of Nausea or vomiting  Last Vitals:  Filed Vitals:   09/21/12 1224  BP: 112/77  Pulse: 78  Temp: 37.1 C  Resp: 18    Post-op Vital Signs: stable   Complications: No apparent anesthesia complications

## 2012-09-21 NOTE — Transfer of Care (Signed)
Immediate Anesthesia Transfer of Care Note  Patient: Lisa Bolton  Procedure(s) Performed: Procedure(s) (LRB) with comments: THYROID LOBECTOMY (Right) - Right Thyroid Lobectomy  Patient Location: PACU  Anesthesia Type:General  Level of Consciousness: awake, alert , oriented and patient cooperative  Airway & Oxygen Therapy: Patient Spontanous Breathing and Patient connected to face mask oxygen  Post-op Assessment: Report given to PACU RN and Post -op Vital signs reviewed and stable  Post vital signs: Reviewed and stable  Complications: No apparent anesthesia complications

## 2012-09-21 NOTE — Anesthesia Preprocedure Evaluation (Addendum)
Anesthesia Evaluation  Patient identified by MRN, date of birth, ID band Patient awake  General Assessment Comment:Uncle with a h/o pseudocholinesterase.  Reviewed: Allergy & Precautions, H&P , NPO status , Patient's Chart, lab work & pertinent test results, reviewed documented beta blocker date and time   History of Anesthesia Complications (+) PSEUDOCHOLINESTERASE DEFICIENCY and Family history of anesthesia reaction  Airway Mallampati: II TM Distance: >3 FB Neck ROM: full    Dental No notable dental hx.    Pulmonary neg pulmonary ROS,  breath sounds clear to auscultation  Pulmonary exam normal       Cardiovascular Exercise Tolerance: Good negative cardio ROS  Rhythm:regular Rate:Normal     Neuro/Psych Anxiety negative neurological ROS     GI/Hepatic negative GI ROS, Neg liver ROS, GERD-  ,  Endo/Other  Morbid obesity  Renal/GU negative Renal ROS  negative genitourinary   Musculoskeletal   Abdominal   Peds  Hematology negative hematology ROS (+) anemia ,   Anesthesia Other Findings   Reproductive/Obstetrics negative OB ROS                          Anesthesia Physical Anesthesia Plan  ASA: III  Anesthesia Plan: General   Post-op Pain Management:    Induction:   Airway Management Planned: Oral ETT  Additional Equipment:   Intra-op Plan:   Post-operative Plan:   Informed Consent: I have reviewed the patients History and Physical, chart, labs and discussed the procedure including the risks, benefits and alternatives for the proposed anesthesia with the patient or authorized representative who has indicated his/her understanding and acceptance.   Dental Advisory Given  Plan Discussed with: CRNA  Anesthesia Plan Comments:         Anesthesia Quick Evaluation

## 2012-09-21 NOTE — Op Note (Signed)
Patient Name:           Lisa Bolton   Date of Surgery:        09/21/2012  Pre op Diagnosis:      Right thyroid nodule  Post op Diagnosis:    Right thyroid nodule  Procedure:                 Right thyroid lobectomy with removal of isthmus  Surgeon:                     Angelia Mould. Derrell Lolling, M.D., FACS  Assistant:                      Darnell Level, M.D., FACS  Operative Indications:  Lisa Bolton is a 26 y.o. female. She was referred by Dr. Teodora Medici for evaluation of a right thyroid nodule. Her primary care physician is Dr. Luan Pulling Tisovic.  The patient has no prior history of thyroid problems and really has no pressure symptoms or pain. Recent physical exam in Dr. Corwin Levins office revealed a palpable nodule on the right side. Thyroid function tests are normal. Ultrasound shows a 2.8 cm, solitary, mostly solid right thyroid mass. There is no adenopathy. Image guided biopsy shows Hurthle cells, but no malignancy noted. My exam reveals a prominent nodule anteriorly to the right of the midline, smooth, firm, 2 cm or greater. There is no adenopathy in the neck.  She has no history of radiation therapy to the neck. She has no family history of thyroid cancer or multiple endocrine neoplasia is. Other medical problems are obesity and mild panic attacks. She works at [employer].    Operative Findings:       There was a firm, smooth, whitish nodule filling the right thyroid lobe. There was  normal thyroid tissue at the isthmus, the superior pole, and laterally. There were no enlarged lymph nodes. The right recurrent laryngeal nerve was visualized and preserved. The superior parathyroid gland was thought to be identified and preserved. The inferior parathyroid gland was not seen, thought to be lateral to the dissection. There was no palpable mass in the left thyroid lobe  Procedure in Detail:          Following the induction of general endotracheal anesthesia the patient was placed in a reverse  Trendelenburg position with a roll behind her shoulders and her neck extended. The neck and upper chest were prepped and draped in a sterile fashion. Intravenous antibiotics were given. Surgical time out was performed. 0.5% Marcaine was used as local infiltration anesthetic. A curved transverse incision was made in the anterior neck, about 2 cm above the suprasternal notch. Dissection was carried down through the subcutaneous tissue and through the platysma muscle. Skin and platysma  flaps were raised superiorly and inferiorly and a self retaining retractor was placed. Strap muscles were divided in the midline. The right strap muscles were slowly dissected off of the right thyroid lobe and the and the nodule. They were very sticky, possibly due to thyroiditis or possibly due to inflammatory change from her biopsy. Ultimately we were able to gently separate the strap muscles from the right thyroid lobe. We mobilized the lower pole and dividing the lower pole venous structures with metal clips and harmonic scalpel. We did the dissection above and slowly mobilized the superior pole in  several small steps. Visible vessels were controlled with metal clips and harmonic scalpel. We dissected the thyroid gland from  lateral to medial. We identified the recurrent laryngeal nerve. The middle thyroid vein and inferior thyroid artery were controlled hemoclips and harmonic scalpel and divided. In small steps I  divided and then mobilized the thyroid gland mostly staying in the capsule. We did ultimately mobilize it up over the trachea and across the midline and then divided the isthmus on the left side with the Harmonic scalpel. The specimen was sent to the lab for  routine histology. Hemostasis was excellent and achieved with electrocautery. The recurrent laryngeal nerve was again inspected and appeared to be intact. Fibrillar hemostatic sponge was placed. Strap muscles were closed in the midline with interrupted sutures of 3-0  Vicryl. Platysma muscle was closed with interrupted sutures of 3-0 Vicryl. The skin was closed with a running subcuticular suture of 4-0 Monocryl and Dermabond. Patient tolerated procedure well and was taken to recovery in stable condition. EBL 20 cc or less, counts correct. Complications none.     Angelia Mould. Derrell Lolling, M.D., FACS General and Minimally Invasive Surgery Breast and Colorectal Surgery  09/21/2012 11:33 AM

## 2012-09-21 NOTE — Progress Notes (Signed)
Pt has a boil which popped yesterday on upper Lt inner thigh area. This area is inflammed. Note posted on front of chart

## 2012-09-22 ENCOUNTER — Encounter (HOSPITAL_COMMUNITY): Payer: Self-pay | Admitting: General Surgery

## 2012-09-22 NOTE — Discharge Summary (Signed)
  Patient ID: Lisa Bolton [id number] 26 y.o. [birth year]  09/21/2012  Discharge date and time: September 22, 2012  Admitting Physician: [name]  Discharge Physician: [name]  Admission Diagnoses:  right thyroid nodule  Discharge Diagnoses: Right thyroid nodule  Operations: Procedure(s): THYROID LOBECTOMY  Admission Condition: good  Discharged Condition: good  Indication for Admission: Lisa Bolton is a 26 y.o. female. She was referred by Dr. Teodora Medici for evaluation of a right thyroid nodule. Her primary care physician is Dr. Luan Pulling Tisovic.  The patient has no prior history of thyroid problems and really has no pressure symptoms or pain. Recent physical exam in Dr. Corwin Levins office as well as by me revealed a palpable nodule on the right side. Thyroid function tests are normal. Ultrasound shows a 2.8 cm, solitary, mostly solid right thyroid mass. There is no adenopathy. Image guided biopsy shows Hurthle cells, but no malignancy noted.  She has no history of radiation therapy to the neck. She has no family history of thyroid cancer or multiple endocrine neoplasia is. Marland Kitchen   Hospital Course: On the day of admission the patient was taken to the operating room and underwent a right thyroid lobectomy with removal of isthmus. She had a smooth solid nodule on the right side. Surgery was uneventful. Postoperatively she did well. Her voice was normal. She had no pain. She was ambulatory a few hours postop and ready to go home on postop day 1. On the day of discharge her wound looked good with minimal swelling. Voice was normal. We discussed diet and activities and wound care. She was told that the pathology report would be called to her in 24-48 hours. She will followup with me in the office in 3 weeks.  Consults: None  Significant Diagnostic Studies: Pathology, pending  Treatments: surgery: Total right thyroid lobectomy.  Disposition: Home  Patient  Instructions:   Lisa Bolton  Home Medication Instructions NWG:[id number]   Printed on:09/22/12 5784  Medication Information                    OVER THE COUNTER MEDICATION Take 1 tablet by mouth daily. Emergency immune support           OVER THE COUNTER MEDICATION Zinc lozenges   Every other day   Last Dose 09/14/12           clonazePAM (KLONOPIN) 0.5 MG tablet Take 0.5 mg by mouth 2 (two) times daily as needed.             Activity: activity as tolerated Diet: regular diet Wound Care: none needed  Follow-up:  With Dr. Derrell Lolling in 3 weeks.  Signed: Angelia Mould. Derrell Lolling, M.D., FACS General and minimally invasive surgery Breast and Colorectal Surgery  09/22/2012, 5:52 AM

## 2012-09-22 NOTE — Progress Notes (Signed)
1 Day Post-Op  Subjective: Ordered. Ambulatory. Says she has no pain and feels well. Swallowing normally.  Operative findings discussed with the patient. She is aware that it will be with another day or 2 before we get the  pathology report  Objective: Vital signs in last 24 hours: Temp:  [97.9 F (36.6 C)-99.1 F (37.3 C)] 98.3 F (36.8 C) (12/18 0201) Pulse Rate:  [76-125] 76  (12/18 0201) Resp:  [14-24] 18  (12/18 0201) BP: (112-153)/(77-101) 118/79 mmHg (12/18 0201) SpO2:  [97 %-100 %] 97 % (12/18 0201) Weight:  [267 lb (121.11 kg)] 267 lb (121.11 kg) (12/17 1349) Last BM Date: 09/21/12  Intake/Output from previous day: 12/17 0701 - 12/18 0700 In: 3483.3 [P.O.:360; I.V.:3073.3; IV Piggyback:50] Out: 3575 [Urine:3500; Blood:75] Intake/Output this shift: Total I/O In: 1190 [P.O.:360; I.V.:830] Out: 2000 [Urine:2000]  General appearance: alert. Friendly. Cooperative. No distress. Mother in room Neck:  voice is normal. Chvostek's sign negative bilaterally. Neck incision looks good. No hematoma. Skin healthy. Minimal swelling.  Lab Results:  No results found for this or any previous visit (from the past 24 hour(s)).   Studies/Results: @RISRSLT24 @     .  ceFAZolin (ANCEF) IV  2 g Intravenous Q8H  . heparin  5,000 Units Subcutaneous Q8H     Assessment/Plan: s/p Procedure(s): THYROID LOBECTOMY  Doing very well postop without obvious complications. Discharge home today She says she has no pain and does not want a prescription pain medication. Will call pathology report to her in 24-48 hours Diet and activities discussed Return to see me in office in 3 weeks.    LOS: 1 day    Dinisha Cai M. Derrell Lolling, M.D., [facility] General and Minimally invasive Surgery Breast and Colorectal Surgery Office:   [phone] Pager:   [phone]  09/22/2012  . .prob

## 2012-09-22 NOTE — Progress Notes (Signed)
Patient discharged per MD order. Discharge instructions reviewed with patient and patient's mother at bedside. All questions answered. Patient wheeled down to car via wheelchair for discharge home. Angelena Form, RN

## 2012-09-23 ENCOUNTER — Telehealth (INDEPENDENT_AMBULATORY_CARE_PROVIDER_SITE_OTHER): Payer: Self-pay | Admitting: General Surgery

## 2012-09-23 ENCOUNTER — Telehealth (INDEPENDENT_AMBULATORY_CARE_PROVIDER_SITE_OTHER): Payer: Self-pay

## 2012-09-23 ENCOUNTER — Other Ambulatory Visit (INDEPENDENT_AMBULATORY_CARE_PROVIDER_SITE_OTHER): Payer: Self-pay | Admitting: General Surgery

## 2012-09-23 NOTE — Telephone Encounter (Signed)
Dr. Jimmy Picket called me and told me that the thyroid lobectomy specimen shows papillary thyroid carcinoma with a smooth fibrous capsule, appears well contained. Final typed report is pending.  I called Ms. Boulier and discuss this with her. She seems to be doing well. I told her that the appropriate treatment was completion thyroidectomy, central compartment neck dissection, postop RAI therapy, and Synthroid hormone replacement forever. Offered to refer her to an endocrinologist for a second opinion at this time and she declined.  At this time we're going to tentatively schedule her for completion thyroidectomy and central compartment node dissection.  We will also make an appointment for her to see me in the office prior to the surgery to discuss this in more detail.   Angelia Mould. Derrell Lolling, M.D., [facility] General and Minimally invasive Surgery Breast and Colorectal Surgery Office:   [phone] Pager:   [phone]

## 2012-09-23 NOTE — Telephone Encounter (Signed)
error 

## 2012-09-23 NOTE — Telephone Encounter (Signed)
LMOM to call re: pathology  Angelia Mould. Derrell Lolling, M.D., [facility] General and Minimally invasive Surgery Breast and Colorectal Surgery Office:   [phone] Pager:   [phone]

## 2012-09-23 NOTE — Telephone Encounter (Signed)
The pt called to schedule a postop appointment and she needs to discuss her next surgery which is now 1/24.  I didn't see any postop time so I will have Annabelle Harman call her.

## 2012-09-23 NOTE — Telephone Encounter (Signed)
Called patient back based message was received. Patient set up to see Dr. Derrell Lolling on 10/20/11 at 10:30 to be seen post op and discuss second surgery on 10/30/11. Patient also set up to see Dr. Derrell Lolling post op from the second surgery on 11/16/12 at 8:45. Advised patient a message will be forwarded to Dr. Derrell Lolling to see if her appointment on 10/20/11 can be moved up to the previous week and if so, I will call her back. Patient agreed.

## 2012-09-27 ENCOUNTER — Telehealth (INDEPENDENT_AMBULATORY_CARE_PROVIDER_SITE_OTHER): Payer: Self-pay | Admitting: General Surgery

## 2012-09-27 NOTE — Telephone Encounter (Signed)
Called patient to advise Dr. Derrell Lolling responded back to message sent and he does want 30 minutes to talk with her. Patient given 10/12/11 at 5:00. Patient agreed to date and time.

## 2012-10-11 ENCOUNTER — Ambulatory Visit (INDEPENDENT_AMBULATORY_CARE_PROVIDER_SITE_OTHER): Payer: Commercial Managed Care - PPO | Admitting: General Surgery

## 2012-10-11 ENCOUNTER — Encounter (INDEPENDENT_AMBULATORY_CARE_PROVIDER_SITE_OTHER): Payer: Self-pay | Admitting: General Surgery

## 2012-10-11 DIAGNOSIS — C73 Malignant neoplasm of thyroid gland: Secondary | ICD-10-CM

## 2012-10-11 HISTORY — DX: Malignant neoplasm of thyroid gland: C73

## 2012-10-11 NOTE — Progress Notes (Signed)
Patient ID: Lisa Bolton, female   DOB: [birth year], 27 y.o.   MRN: [id number]  No chief complaint on file.   HPI Lisa Bolton is a 27 y.o. female.  She returns for further discussion of her recently diagnosed papillary thyroid cancer.  The patient was originally referred for a palpable nodule of the right thyroid lobe. She had no risk factors. On 09/21/2012 she underwent right thyroid lobectomy. She is recovering from the surgery uneventfully. Voice is normal. Swallowing normally. No paresthesias. Final pathology report showed well-differentiated papillary thyroid carcinoma. I discussed this with her by phone. She wanted to go ahead and schedule the completion thyroidectomy and central compartment neck dissection. She is here today with her parents preoperatively to discuss other issues.  We spent approximately 45 minutes discussing the diagnosis of thyroid cancer. The well-differentiated nature of her cancer.  The logic behind completion thyroidectomy, issues surrounding postoperative RAI  ablation, and long-term Synthroid use. She is aware that she will need to be involved with an endocrinologist, who will supervise her RAI  treatment and manage her Synthroid dose and assist with long-term surveillance.. I suggested that Dr. Adrian Prince in Dr. Onnie Boer office could handle this very nicely. The patient and her parents and comfortable following all of this discussion. HPI  Past Medical History  Diagnosis Date  . Thyroid nodule   . Vaginal discharge     chronic  . Recurrent boils     on vulva  . Anemia   . Chest pain   . Panic attacks   . Family history of anesthesia complication     UNCLE WITH  PSUEDOCHOLINESTEROSE  DEFIENCY  . GERD (gastroesophageal reflux disease)     Past Surgical History  Procedure Date  . Anterior cruciate ligament repair 2002 - approximate    left  . Thyroid lobectomy 09/21/2012    Procedure: THYROID LOBECTOMY;  Surgeon: [name], MD;   Location: [facility];  Service: General;  Laterality: Right;  Right Thyroid Lobectomy    No family history on file.  Social History History  Substance Use Topics  . Smoking status: Never Smoker   . Smokeless tobacco: Never Used  . Alcohol Use: Yes     Comment: socially    No Known Allergies  Current Outpatient Prescriptions  Medication Sig Dispense Refill  . clonazePAM (KLONOPIN) 0.5 MG tablet Take 0.5 mg by mouth 2 (two) times daily as needed.      Marland Kitchen OVER THE COUNTER MEDICATION Take 1 tablet by mouth daily. Emergency immune support      . OVER THE COUNTER MEDICATION Zinc lozenges   Every other day   Last Dose 09/14/12        Review of Systems Review of Systems  Constitutional: Negative for fever, chills and unexpected weight change.  HENT: Negative for hearing loss, congestion, sore throat, trouble swallowing and voice change.   Eyes: Negative for visual disturbance.  Respiratory: Negative for cough and wheezing.   Cardiovascular: Negative for chest pain, palpitations and leg swelling.  Gastrointestinal: Negative for nausea, vomiting, abdominal pain, diarrhea, constipation, blood in stool, abdominal distention and anal bleeding.  Genitourinary: Negative for hematuria, vaginal bleeding and difficulty urinating.  Musculoskeletal: Negative for arthralgias.  Skin: Negative for rash and wound.  Neurological: Negative for seizures, syncope and headaches.  Hematological: Negative for adenopathy. Does not bruise/bleed easily.  Psychiatric/Behavioral: Negative for confusion.    Last menstrual period 08/23/2012.  Physical Exam Physical Exam  Constitutional: She is oriented to  person, place, and time. She appears well-developed and well-nourished. No distress.       BMI 45.19  HENT:  Head: Normocephalic and atraumatic.  Nose: Nose normal.  Mouth/Throat: No oropharyngeal exudate.  Eyes: Conjunctivae normal and EOM are normal. Pupils are equal, round, and reactive to light. Left eye  exhibits no discharge. No scleral icterus.  Neck: Neck supple. No JVD present. No tracheal deviation present. No thyromegaly present.       Recent thyroidectomy scar healing nicely. Minimal edema. No infection. No adenopathy. Voice is strong. Chvostek's sign negative bilaterally.  Cardiovascular: Normal rate, regular rhythm, normal heart sounds and intact distal pulses.   No murmur heard. Pulmonary/Chest: Effort normal and breath sounds normal. No respiratory distress. She has no wheezes. She has no rales. She exhibits no tenderness.  Abdominal: Soft. Bowel sounds are normal. She exhibits no distension and no mass. There is no tenderness. There is no rebound and no guarding.  Musculoskeletal: She exhibits no edema and no tenderness.  Lymphadenopathy:    She has no cervical adenopathy.  Neurological: She is alert and oriented to person, place, and time. She exhibits normal muscle tone. Coordination normal.  Skin: Skin is warm. No rash noted. She is not diaphoretic. No erythema. No pallor.  Psychiatric: She has a normal mood and affect. Her behavior is normal. Judgment and thought content normal.    Data Reviewed Pathology report  Assessment    Well differentiated papillary thyroid carcinoma of right thyroid lobe. Recovering uneventfully following right thyroid lobectomy  Obesity    Plan    Patient is scheduled for completion left thyroid lobectomy and central compartment no dissection on January 24.  I discussed the indications, details, techniques, and numerous risks of the surgery once again with the patient and her parents. She understands these issues. Her questions were answered. She agrees with this plan.  She will be referred to endocrinology postop for supervision of radioiodine ablation, staging, and long-term surveillance. Dr. Evlyn Kanner is suggested.  She was  offered a second opinion preoperativly and has declined that.       Angelia Mould. Derrell Lolling, M.D., [facility] General and Minimally invasive Surgery Breast and Colorectal Surgery Office:   [phone] Pager:   [phone]  10/11/2012, 5:24 PM

## 2012-10-11 NOTE — Patient Instructions (Addendum)
You appear to be recovering well and healing well following your thyroid surgery.  You have been given a copy of the pathology report and we have discussed that in detail today.  You are  scheduled for surgery on January 24. The surgery will be a left thyroid lobectomy and central compartment node dissection.      Thyroidectomy Thyroidectomy is the removal of part or all of your thyroid gland. Your thyroid gland is a butterfly-shaped gland at the base of your neck. It produces a substance called thyroid hormone, which regulates the physical and chemical processes that keep your body functioning and make energy available to your body (metabolism). The amount of thyroid gland tissue that is removed during a thyroidectomy depends on the reason for the procedure. Typically, if only a part of your gland is removed, enough thyroid gland tissue remains to maintain normal function. If your entire thyroid gland is removed or if the amount of thyroid gland tissue remaining is inadequate to maintain normal function, you will need life-long treatment with thyroid hormone on a daily basis. Thyroidectomy maybe performed when you have the following conditions:  Thyroid nodules. These are small, abnormal collections of tissue that form inside the thyroid gland. If these nodules begin to enlarge at a rapid rate, a sample of tissue from the nodule is taken through a needle and examined (needle biopsy). This is done to determine if the nodules are cancerous. Depending on the outcome of this exam, thyroidectomy may be necessary.  Thyroid cancer.  Goiter, which is an enlarged thyroid gland. All or part of the thyroid gland may be removed if the gland has become so large that it causes difficulty breathing or swallowing.  Hyperthyroidism. This is when the thyroid gland produces too much thyroid hormone. Hypothyroidism can cause symptoms of fluctuating weight, intolerance to heat, irritability, shortness of breath,  and chest pain. LET YOUR CAREGIVER KNOW ABOUT:   Allergies to food or medicine.  Medicines that you are taking, including vitamins, herbs, eyedrops, over-the-counter medicines, and creams.  Previous problems you have had with anesthetics or numbing medicines.  History of bleeding problems or blood clots.  Previous surgeries you have had.  Other health problems, including diabetes and kidney problems, you have had.  Possibility of pregnancy, if this applies. BEFORE THE PROCEDURE   Do not eat or drink anything, including water, for at least 6 hours before the procedure.  Ask your caregiver whether you should stop taking certain medicines before the day of the procedure. PROCEDURE  There are different ways that thyroidectomy is performed. For each type, you will be given a medicine to make you sleep (general anesthetic). The three main types of thyroidectomy are listed as follows:  Conventional thyroidectomy A cut (incision) in the center portion of your lower neck is made with a scalpel. Muscles below your skin are separated to gain access to your thyroid gland. Your thyroid gland is dissected from your windpipe (trachea). Often a drain is placed at the incision site to drain any blood that accumulates under the skin after the procedure. This drain will be removed before you go home. The wound from the incision should heal within 2 weeks.  Endoscopic thyroidectomy Small incisions are made in your lower neck. A small instrument (endoscope) is inserted under your skin at the incision sites. The endoscope used for thyroidectomy consists of 2 flexible tubes. Inside one of the tubes is a video camera that is used to guide the Careers adviser. Tools to remove  the thyroid gland, including a tool to cut the gland (dissectors) and a suction device, are inserted through the other tube. The surgeon uses the dissectors to dissect the thyroid gland from the trachea and remove it.  Robotic thyroidectomy This  procedure allows your thyroid gland to be removed through incisions in your armpit, your chest, or high in your neck. Instruments similar to endoscopes provide a 3-dimensional picture of the surgical site. Dissecting instruments are controlled by devices similar to joysticks. These devices allow more accurate manipulation of the instruments. After the blood supply to the gland is removed, the gland is cut into several pieces and removed through the incisions. RISKS AND COMPLICATIONS Complications associated with thyroidectomy are rare, but they can occur. Possible complications include:  A decrease in parathyroid hormone levels (hypoparathyroidism) Your parathyroid glands are located close behind your thyroid gland. They are responsible for maintaining calcium levels inthe body. If they are damaged or removed, levels of calcium in the blood become low and nerves become irritable, which can cause muscle spasms. Medicines are available to treat this.  Bacterial infection This can often be treated with medicines that kill bacteria (antibiotics).  Damage to your voice box nerves This could cause hoarseness or complete loss of voice.  Bleeding or airway obstruction. AFTER THE PROCEDURE   You will rest in the recovery room as you wake up.  When you first wake up, your throat may feel slightly sore.  You will not be allowed to eat or drink until instructed otherwise.  You will be taken to your hospital room. You will usually stay at the hospital for 1 or 2 nights.  If a drain is placed during the procedure, it usually is removed the next day.  You may have some mild neck pain.  Your voice may be weak. This usually is temporary. Document Released: 03/18/2001 Document Revised: 12/15/2011 Document Reviewed: 12/25/2010 [facility] Patient Information 2013 [place], [place].

## 2012-10-19 ENCOUNTER — Encounter (INDEPENDENT_AMBULATORY_CARE_PROVIDER_SITE_OTHER): Payer: Commercial Managed Care - PPO | Admitting: General Surgery

## 2012-10-19 ENCOUNTER — Encounter (HOSPITAL_COMMUNITY): Payer: Self-pay | Admitting: Pharmacy Technician

## 2012-10-21 ENCOUNTER — Encounter (HOSPITAL_COMMUNITY)
Admission: RE | Admit: 2012-10-21 | Discharge: 2012-10-21 | Disposition: A | Discharge status: Home / Self Care | Payer: 59 | Source: Ambulatory Visit | Attending: General Surgery | Admitting: General Surgery

## 2012-10-21 ENCOUNTER — Encounter (HOSPITAL_COMMUNITY): Payer: Self-pay

## 2012-10-21 LAB — BASIC METABOLIC PANEL
BUN: 13 mg/dL (ref 6–23)
GFR calc Af Amer: >90 mL/min (ref >90)
GFR calc non Af Amer: >90 mL/min (ref >90)
Potassium: 4.5 mEq/L (ref 3.5–5.1)

## 2012-10-21 LAB — CBC WITH DIFFERENTIAL/PLATELET
Basophils Absolute: 0 10*3/uL (ref 0.0–0.1)
Basophils Relative: 1 % (ref 0–1)
Hemoglobin: 12.6 g/dL (ref 12.0–15.0)
MCHC: 33.9 g/dL (ref 30.0–36.0)
Monocytes Relative: 7 % (ref 3–12)
Neutro Abs: 3.1 10*3/uL (ref 1.7–7.7)
Neutrophils Relative %: 59 % (ref 43–77)
RDW: 14.2 % (ref 11.5–15.5)
WBC: 5.3 10*3/uL (ref 4.0–10.5)

## 2012-10-21 LAB — SURGICAL PCR SCREEN
MRSA, PCR: NEGATIVE
Staphylococcus aureus: NEGATIVE

## 2012-10-21 NOTE — Pre-Procedure Instructions (Addendum)
Lisa Bolton  10/21/2012   Your procedure is scheduled on:  Friday, January 24th.  Report to [facility] at 0700 AM.  Call this number if you have problems the morning of surgery: [phone]   Remember:   Do not eat food or drink liquids after midnight.     Take these medicines the morning of surgery with A SIP OF WATER: Clonazepam (Klonopin) if neeeded.  Stop taking Aspirin, Coumadin, Plavix, Effient and herbal medications.  Do not take any NSAIDS ie:  Ibuprofen, Advil, Naproxen.               Do not wear jewelry, make-up or nail polish.  Do not wear lotions, powders, or perfumes. You may wear deodorant.  Do not shave 48 hours prior to surgery. Men may shave face and neck.  Do not bring valuables to the hospital.  Contacts, dentures or bridgework may not be worn into surgery.  Leave suitcase in the car. After surgery it may be brought to your room.  For patients admitted to the hospital, checkout time is 11:00 AM the day of discharge.   Patients discharged the day of surgery will not be allowed to drive home.  Name and phone number of your driver: NA   Special Instructions: Shower using CHG 2 nights before surgery and the night before surgery.  If you shower the day of surgery use CHG.  Use special wash - you have one bottle of CHG for all showers.  You should use approximately 1/3 of the bottle for each shower.   Please read over the following fact sheets that you were given: Pain Booklet, Coughing and Deep Breathing and Surgical Site Infection Prevention

## 2012-10-25 ENCOUNTER — Telehealth (INDEPENDENT_AMBULATORY_CARE_PROVIDER_SITE_OTHER): Payer: Self-pay | Admitting: General Surgery

## 2012-10-25 NOTE — Telephone Encounter (Signed)
Pt of Dr. Derrell Lolling called to report she has surgery scheduled on Friday, but has had uncomfortable pressure in her ear all weekend.  She was told by someone that she cannot be on medicine before her surgery.  Reassured pt and instructed her to call her PCP for diagnosis and treatment, that the only reason we would reschedule her surgery would be active fever on that day.  She understands and will call her PCP.

## 2012-10-28 MED ORDER — DEXTROSE 5 % IV SOLN
3.0000 g | INTRAVENOUS | Status: AC
  Administered 2012-10-29: 3 g via INTRAVENOUS
  Filled 2012-10-28: qty 3000

## 2012-10-28 NOTE — H&P (Signed)
Diagnoses     Malignant neoplasm of thyroid gland - Primary    193          History and Physical    Lisa Bolton Patient ID: Lisa Bolton, female DOB: 1986/03/27, 27 y.o. MRN: [id number]  No chief complaint on file.   HPI  Lisa Bolton is a 27 y.o. female. She returns for further discussion of her recently diagnosed papillary thyroid cancer.  The patient was originally referred for a palpable nodule of the right thyroid lobe. She had no risk factors. On 09/21/2012 she underwent right thyroid lobectomy. She is recovering from the surgery uneventfully. Voice is normal. Swallowing normally. No paresthesias. Final pathology report showed well-differentiated papillary thyroid carcinoma. I discussed this with her by phone. She wanted to go ahead and schedule the completion thyroidectomy and central compartment neck dissection. She is here today with her parents preoperatively to discuss other issues.  We spent approximately 45 minutes discussing the diagnosis of thyroid cancer. The well-differentiated nature of her cancer. The logic behind completion thyroidectomy, issues surrounding postoperative RAI ablation, and long-term Synthroid use. She is aware that she will need to be involved with an endocrinologist, who will supervise her RAI treatment and manage her Synthroid dose and assist with long-term surveillance.. I suggested that Dr. Adrian Prince in Dr. Onnie Boer office could handle this very nicely. The patient and her parents and comfortable following all of this discussion.  HPI     Past Medical History     Diagnosis  Date     .  Thyroid nodule      .  Vaginal discharge        chronic     .  Recurrent boils        on vulva     .  Anemia      .  Chest pain      .  Panic attacks      .  Family history of anesthesia complication        UNCLE WITH PSUEDOCHOLINESTEROSE DEFIENCY     .  GERD (gastroesophageal reflux disease)          Past Surgical History       Procedure  Date     .  Anterior cruciate ligament repair  2002 - approximate       left     .  Thyroid lobectomy  09/21/2012       Procedure: THYROID LOBECTOMY; Surgeon: Lisa Bolton; Location: [facility]; Service: General; Laterality: Right; Right Thyroid Lobectomy      No family history on file.  Social History     History     Substance Use Topics     .  Smoking status:  Never Smoker     .  Smokeless tobacco:  Never Used     .  Alcohol Use:  Yes        Comment: socially      No Known Allergies     Current Outpatient Prescriptions     Medication  Sig  Dispense  Refill     .  clonazePAM (KLONOPIN) 0.5 MG tablet  Take 0.5 mg by mouth 2 (two) times daily as needed.       Marland Kitchen  OVER THE COUNTER MEDICATION  Take 1 tablet by mouth daily. Emergency immune support       .  OVER THE COUNTER MEDICATION  Zinc lozenges Every other  day Last Dose 09/14/12        Review of Systems  Review of Systems  Constitutional: Negative for fever, chills and unexpected weight change.  HENT: Negative for hearing loss, congestion, sore throat, trouble swallowing and voice change.  Eyes: Negative for visual disturbance.  Respiratory: Negative for cough and wheezing.  Cardiovascular: Negative for chest pain, palpitations and leg swelling.  Gastrointestinal: Negative for nausea, vomiting, abdominal pain, diarrhea, constipation, blood in stool, abdominal distention and anal bleeding.  Genitourinary: Negative for hematuria, vaginal bleeding and difficulty urinating.  Musculoskeletal: Negative for arthralgias.  Skin: Negative for rash and wound.  Neurological: Negative for seizures, syncope and headaches.  Hematological: Negative for adenopathy. Does not bruise/bleed easily.  Psychiatric/Behavioral: Negative for confusion.   Last menstrual period 08/23/2012.  Physical Exam  Physical Exam  Constitutional: She is oriented to person, place, and time. She appears well-developed and well-nourished. No distress.   BMI 45.19  HENT:  Head: Normocephalic and atraumatic.  Nose: Nose normal.  Mouth/Throat: No oropharyngeal exudate.  Eyes: Conjunctivae normal and EOM are normal. Pupils are equal, round, and reactive to light. Left eye exhibits no discharge. No scleral icterus.  Neck: Neck supple. No JVD present. No tracheal deviation present. No thyromegaly present.  Recent thyroidectomy scar healing nicely. Minimal edema. No infection. No adenopathy. Voice is strong. Chvostek's sign negative bilaterally.  Cardiovascular: Normal rate, regular rhythm, normal heart sounds and intact distal pulses.  No murmur heard.  Pulmonary/Chest: Effort normal and breath sounds normal. No respiratory distress. She has no wheezes. She has no rales. She exhibits no tenderness.  Abdominal: Soft. Bowel sounds are normal. She exhibits no distension and no mass. There is no tenderness. There is no rebound and no guarding.  Musculoskeletal: She exhibits no edema and no tenderness.  Lymphadenopathy:  She has no cervical adenopathy.  Neurological: She is alert and oriented to person, place, and time. She exhibits normal muscle tone. Coordination normal.  Skin: Skin is warm. No rash noted. She is not diaphoretic. No erythema. No pallor.  Psychiatric: She has a normal mood and affect. Her behavior is normal. Judgment and thought content normal.   Data Reviewed  Pathology report  Assessment   Well differentiated papillary thyroid carcinoma of right thyroid lobe. Recovering uneventfully following right thyroid lobectomy  Obesity   Plan   Patient is scheduled for completion left thyroid lobectomy and central compartment no dissection on January 24.  I discussed the indications, details, techniques, and numerous risks of the surgery once again with the patient and her parents. She understands these issues. Her questions were answered. She agrees with this plan.  She will be referred to endocrinology postop for supervision of  radioiodine ablation, staging, and long-term surveillance. Dr. Evlyn Kanner is suggested.  She was offered a second opinion preoperativly and has declined that.   Angelia Mould. Derrell Lolling, M.D., [facility]  General and Minimally invasive Surgery  Breast and Colorectal Surgery  Office: [phone]  Pager: [phone]  10/11/2012, 5:24 PM         Patient Instructions     You appear to be recovering well and healing well following your thyroid surgery.  You have been given a copy of the pathology report and we have discussed that in detail today.  You are scheduled for surgery on January 24. The surgery will be a left thyroid lobectomy and central compartment node dissection.  Thyroidectomy  Thyroidectomy is the removal of part or all of your thyroid  gland. Your thyroid gland is a butterfly-shaped gland at the base of your neck. It produces a substance called thyroid hormone, which regulates the physical and chemical processes that keep your body functioning and make energy available to your body (metabolism).  The amount of thyroid gland tissue that is removed during a thyroidectomy depends on the reason for the procedure. Typically, if only a part of your gland is removed, enough thyroid gland tissue remains to maintain normal function. If your entire thyroid gland is removed or if the amount of thyroid gland tissue remaining is inadequate to maintain normal function, you will need life-long treatment with thyroid hormone on a daily basis.  Thyroidectomy maybe performed when you have the following conditions:  Thyroid nodules. These are small, abnormal collections of tissue that form inside the thyroid gland. If these nodules begin to enlarge at a rapid rate, a sample of tissue from the nodule is taken through a needle and examined (needle biopsy). This is done to determine if the nodules are cancerous. Depending on the outcome of this exam, thyroidectomy may be necessary.    Thyroid cancer.  Goiter, which is an enlarged thyroid gland. All or part of the thyroid gland may be removed if the gland has become so large that it causes difficulty breathing or swallowing.  Hyperthyroidism. This is when the thyroid gland produces too much thyroid hormone. Hypothyroidism can cause symptoms of fluctuating weight, intolerance to heat, irritability, shortness of breath, and chest pain. LET YOUR CAREGIVER KNOW ABOUT:  Allergies to food or medicine.  Medicines that you are taking, including vitamins, herbs, eyedrops, over-the-counter medicines, and creams.  Previous problems you have had with anesthetics or numbing medicines.  History of bleeding problems or blood clots.  Previous surgeries you have had.  Other health problems, including diabetes and kidney problems, you have had.  Possibility of pregnancy, if this applies. BEFORE THE PROCEDURE  Do not eat or drink anything, including water, for at least 6 hours before the procedure.  Ask your caregiver whether you should stop taking certain medicines before the day of the procedure. PROCEDURE  There are different ways that thyroidectomy is performed. For each type, you will be given a medicine to make you sleep (general anesthetic). The three main types of thyroidectomy are listed as follows:  Conventional thyroidectomy A cut (incision) in the center portion of your lower neck is made with a scalpel. Muscles below your skin are separated to gain access to your thyroid gland. Your thyroid gland is dissected from your windpipe (trachea). Often a drain is placed at the incision site to drain any blood that accumulates under the skin after the procedure. This drain will be removed before you go home. The wound from the incision should heal within 2 weeks.  Endoscopic thyroidectomy Small incisions are made in your lower neck. A small instrument (endoscope) is inserted under your skin at the incision sites. The endoscope used for  thyroidectomy consists of 2 flexible tubes. Inside one of the tubes is a video camera that is used to guide the Careers adviser. Tools to remove the thyroid gland, including a tool to cut the gland (dissectors) and a suction device, are inserted through the other tube. The surgeon uses the dissectors to dissect the thyroid gland from the trachea and remove it.  Robotic thyroidectomy This procedure allows your thyroid gland to be removed through incisions in your armpit, your chest, or high in your neck. Instruments similar to endoscopes provide a 3-dimensional picture of  the surgical site. Dissecting instruments are controlled by devices similar to joysticks. These devices allow more accurate manipulation of the instruments. After the blood supply to the gland is removed, the gland is cut into several pieces and removed through the incisions. RISKS AND COMPLICATIONS  Complications associated with thyroidectomy are rare, but they can occur. Possible complications include:  A decrease in parathyroid hormone levels (hypoparathyroidism) Your parathyroid glands are located close behind your thyroid gland. They are responsible for maintaining calcium levels inthe body. If they are damaged or removed, levels of calcium in the blood become low and nerves become irritable, which can cause muscle spasms. Medicines are available to treat this.  Bacterial infection This can often be treated with medicines that kill bacteria (antibiotics).  Damage to your voice box nerves This could cause hoarseness or complete loss of voice.  Bleeding or airway obstruction. AFTER THE PROCEDURE  You will rest in the recovery room as you wake up.  When you first wake up, your throat may feel slightly sore.  You will not be allowed to eat or drink until instructed otherwise.  You will be taken to your hospital room. You will usually stay at the hospital for 1 or 2 nights.  If a drain is placed during the procedure, it usually is removed the  next day.  You may have some mild neck pain.  Your voice may be weak. This usually is temporary. Document Released: 03/18/2001 Document Revised: 12/15/2011 Document Reviewed: 12/25/2010  [facility] Patient Information 2013 [place], [place].

## 2012-10-28 NOTE — Anesthesia Preprocedure Evaluation (Addendum)
Anesthesia Evaluation  Patient identified by MRN, date of birth, ID band Patient awake  General Assessment Comment:Family history of pseudocholinesterase deficiency (uncle)  Reviewed: Allergy & Precautions, H&P , NPO status , Patient's Chart, lab work & pertinent test results  History of Anesthesia Complications (+) Family history of anesthesia reaction  Airway Mallampati: I TM Distance: >3 FB Neck ROM: Full    Dental  (+) Teeth Intact and Dental Advisory Given   Pulmonary neg shortness of breath,  breath sounds clear to auscultation  Pulmonary exam normal       Cardiovascular negative cardio ROS  Rhythm:Regular Rate:Normal     Neuro/Psych Anxiety negative neurological ROS     GI/Hepatic Neg liver ROS, GERD-  Controlled,  Endo/Other  Morbid obesity  Renal/GU negative Renal ROS     Musculoskeletal   Abdominal (+) + obese,   Peds  Hematology   Anesthesia Other Findings   Reproductive/Obstetrics                         Anesthesia Physical Anesthesia Plan  ASA: II  Anesthesia Plan: General   Post-op Pain Management:    Induction: Intravenous  Airway Management Planned: Oral ETT  Additional Equipment:   Intra-op Plan:   Post-operative Plan: Extubation in OR  Informed Consent: I have reviewed the patients History and Physical, chart, labs and discussed the procedure including the risks, benefits and alternatives for the proposed anesthesia with the patient or authorized representative who has indicated his/her understanding and acceptance.   Dental advisory given  Plan Discussed with: Anesthesiologist, Surgeon and CRNA  Anesthesia Plan Comments: (Plan routine monitors, GETA )       Anesthesia Quick Evaluation

## 2012-10-28 NOTE — Consult Note (Signed)
Anesthesia Note:  Received a phone call from PAT RN Eunice Blase that patient had called to verify anesthesia staff would be aware that her uncle has a history of pseudocholinesterase deficiency.  She is s/p right thyroidectomy at [facility] on 09/21/12 and is now scheduled for left thyroidectomy on 10/29/12.  She tolerated anesthesia well in December and hopes to have similar agents used for this procedure.  She will talk further with her anesthesiologist on the day of surgery.     Shonna Chock, PA-C  10/28/12 [phone]

## 2012-10-29 ENCOUNTER — Ambulatory Visit (HOSPITAL_COMMUNITY)
Admission: RE | Admit: 2012-10-29 | Discharge: 2012-10-30 | Disposition: A | Discharge status: Home / Self Care | Payer: 59 | Source: Ambulatory Visit | Attending: General Surgery | Admitting: General Surgery

## 2012-10-29 ENCOUNTER — Encounter (HOSPITAL_COMMUNITY)
Admission: RE | Disposition: A | Discharge status: Home / Self Care | Payer: Self-pay | Source: Ambulatory Visit | Attending: General Surgery

## 2012-10-29 ENCOUNTER — Encounter (HOSPITAL_COMMUNITY): Payer: Self-pay | Admitting: Vascular Surgery

## 2012-10-29 ENCOUNTER — Encounter (HOSPITAL_COMMUNITY): Payer: Self-pay | Admitting: *Deleted

## 2012-10-29 ENCOUNTER — Other Ambulatory Visit (INDEPENDENT_AMBULATORY_CARE_PROVIDER_SITE_OTHER): Payer: Self-pay | Admitting: General Surgery

## 2012-10-29 ENCOUNTER — Ambulatory Visit (HOSPITAL_COMMUNITY): Payer: 59 | Admitting: Vascular Surgery

## 2012-10-29 ENCOUNTER — Telehealth (INDEPENDENT_AMBULATORY_CARE_PROVIDER_SITE_OTHER): Payer: Self-pay | Admitting: General Surgery

## 2012-10-29 DIAGNOSIS — Z01812 Encounter for preprocedural laboratory examination: Secondary | ICD-10-CM | POA: Insufficient documentation

## 2012-10-29 DIAGNOSIS — C73 Malignant neoplasm of thyroid gland: Secondary | ICD-10-CM | POA: Diagnosis present

## 2012-10-29 DIAGNOSIS — K219 Gastro-esophageal reflux disease without esophagitis: Secondary | ICD-10-CM | POA: Insufficient documentation

## 2012-10-29 DIAGNOSIS — F411 Generalized anxiety disorder: Secondary | ICD-10-CM | POA: Insufficient documentation

## 2012-10-29 HISTORY — PX: THYROID LOBECTOMY: SHX420

## 2012-10-29 HISTORY — PX: LYMPH NODE DISSECTION: SHX5087

## 2012-10-29 LAB — CALCIUM: Calcium: 8.9 mg/dL (ref 8.4–10.5)

## 2012-10-29 SURGERY — LOBECTOMY, THYROID
Anesthesia: General | Site: Neck | Wound class: Clean

## 2012-10-29 MED ORDER — HYDROMORPHONE HCL PF 1 MG/ML IJ SOLN
INTRAMUSCULAR | Status: AC
  Filled 2012-10-29: qty 1

## 2012-10-29 MED ORDER — ONDANSETRON HCL 4 MG/2ML IJ SOLN: 4.0000 mg | Freq: Four times a day (QID) | INTRAMUSCULAR | Status: DC | PRN

## 2012-10-29 MED ORDER — ONDANSETRON HCL 4 MG PO TABS: 4.0000 mg | ORAL_TABLET | Freq: Four times a day (QID) | ORAL | Status: DC | PRN

## 2012-10-29 MED ORDER — ACETAMINOPHEN 10 MG/ML IV SOLN
INTRAVENOUS | Status: AC
  Filled 2012-10-29: qty 100

## 2012-10-29 MED ORDER — ONDANSETRON HCL 4 MG/2ML IJ SOLN
INTRAMUSCULAR | Status: DC | PRN
  Administered 2012-10-29: 4 mg via INTRAVENOUS

## 2012-10-29 MED ORDER — PHENYLEPHRINE HCL 10 MG/ML IJ SOLN
INTRAMUSCULAR | Status: DC | PRN
  Administered 2012-10-29 (×3): 80 ug via INTRAVENOUS
  Administered 2012-10-29: 40 ug via INTRAVENOUS
  Administered 2012-10-29 (×2): 80 ug via INTRAVENOUS
  Administered 2012-10-29 (×2): 40 ug via INTRAVENOUS

## 2012-10-29 MED ORDER — MEPERIDINE HCL 25 MG/ML IJ SOLN: 6.2500 mg | INTRAMUSCULAR | Status: DC | PRN

## 2012-10-29 MED ORDER — PROPOFOL 10 MG/ML IV BOLUS
INTRAVENOUS | Status: DC | PRN
  Administered 2012-10-29: 200 mg via INTRAVENOUS

## 2012-10-29 MED ORDER — OXYCODONE-ACETAMINOPHEN 5-325 MG PO TABS: 1.0000 | ORAL_TABLET | ORAL | Status: DC | PRN

## 2012-10-29 MED ORDER — OXYCODONE HCL 5 MG PO TABS: 5.0000 mg | ORAL_TABLET | Freq: Once | ORAL | Status: DC | PRN

## 2012-10-29 MED ORDER — MIDAZOLAM HCL 5 MG/5ML IJ SOLN
INTRAMUSCULAR | Status: DC | PRN
  Administered 2012-10-29: 2 mg via INTRAVENOUS

## 2012-10-29 MED ORDER — LIDOCAINE HCL (CARDIAC) 20 MG/ML IV SOLN
INTRAVENOUS | Status: DC | PRN
  Administered 2012-10-29: 20 mg via INTRAVENOUS

## 2012-10-29 MED ORDER — LACTATED RINGERS IV SOLN
INTRAVENOUS | Status: DC
  Administered 2012-10-29 (×2): via INTRAVENOUS

## 2012-10-29 MED ORDER — BUPIVACAINE-EPINEPHRINE 0.5% -1:200000 IJ SOLN
INTRAMUSCULAR | Status: DC | PRN
  Administered 2012-10-29: 4.5 mL

## 2012-10-29 MED ORDER — 0.9 % SODIUM CHLORIDE (POUR BTL) OPTIME
TOPICAL | Status: DC | PRN
  Administered 2012-10-29: 1000 mL

## 2012-10-29 MED ORDER — CLONAZEPAM 0.5 MG PO TABS: 0.5000 mg | ORAL_TABLET | Freq: Two times a day (BID) | ORAL | Status: DC | PRN

## 2012-10-29 MED ORDER — GLYCOPYRROLATE 0.2 MG/ML IJ SOLN
INTRAMUSCULAR | Status: DC | PRN
  Administered 2012-10-29: 0.4 mg via INTRAVENOUS
  Administered 2012-10-29 (×2): 0.2 mg via INTRAVENOUS

## 2012-10-29 MED ORDER — MORPHINE SULFATE 2 MG/ML IJ SOLN: 2.0000 mg | INTRAMUSCULAR | Status: DC | PRN

## 2012-10-29 MED ORDER — CALCIUM CARBONATE 1250 (500 CA) MG PO TABS
2.0000 | ORAL_TABLET | Freq: Two times a day (BID) | ORAL | Status: DC
  Administered 2012-10-29 – 2012-10-30 (×2): 1000 mg via ORAL
  Filled 2012-10-29 (×4): qty 2

## 2012-10-29 MED ORDER — ACETAMINOPHEN 10 MG/ML IV SOLN
1000.0000 mg | Freq: Once | INTRAVENOUS | Status: AC
  Administered 2012-10-29: 1000 mg via INTRAVENOUS
  Filled 2012-10-29: qty 100

## 2012-10-29 MED ORDER — FENTANYL CITRATE 0.05 MG/ML IJ SOLN
INTRAMUSCULAR | Status: DC | PRN
  Administered 2012-10-29: 150 ug via INTRAVENOUS
  Administered 2012-10-29: 100 ug via INTRAVENOUS

## 2012-10-29 MED ORDER — PROMETHAZINE HCL 25 MG/ML IJ SOLN: 6.2500 mg | INTRAMUSCULAR | Status: DC | PRN

## 2012-10-29 MED ORDER — CHLORHEXIDINE GLUCONATE 4 % EX LIQD: 1.0000 "application " | Freq: Once | CUTANEOUS | Status: DC

## 2012-10-29 MED ORDER — HYDROMORPHONE HCL PF 1 MG/ML IJ SOLN
0.2500 mg | INTRAMUSCULAR | Status: DC | PRN
  Administered 2012-10-29: 0.5 mg via INTRAVENOUS

## 2012-10-29 MED ORDER — HEPARIN SODIUM (PORCINE) 5000 UNIT/ML IJ SOLN
5000.0000 [IU] | Freq: Three times a day (TID) | INTRAMUSCULAR | Status: DC
  Filled 2012-10-29 (×3): qty 1

## 2012-10-29 MED ORDER — POTASSIUM CHLORIDE IN NACL 20-0.9 MEQ/L-% IV SOLN
INTRAVENOUS | Status: DC
  Administered 2012-10-29 – 2012-10-30 (×3): via INTRAVENOUS
  Filled 2012-10-29 (×6): qty 1000

## 2012-10-29 MED ORDER — NEOSTIGMINE METHYLSULFATE 1 MG/ML IJ SOLN
INTRAMUSCULAR | Status: DC | PRN
  Administered 2012-10-29: 3 mg via INTRAVENOUS

## 2012-10-29 MED ORDER — OXYCODONE HCL 5 MG/5ML PO SOLN: 5.0000 mg | Freq: Once | ORAL | Status: DC | PRN

## 2012-10-29 MED ORDER — MIDAZOLAM HCL 2 MG/2ML IJ SOLN: 0.5000 mg | Freq: Once | INTRAMUSCULAR | Status: DC | PRN

## 2012-10-29 MED ORDER — BUPIVACAINE-EPINEPHRINE PF 0.5-1:200000 % IJ SOLN
INTRAMUSCULAR | Status: AC
  Filled 2012-10-29: qty 30

## 2012-10-29 MED ORDER — ROCURONIUM BROMIDE 100 MG/10ML IV SOLN
INTRAVENOUS | Status: DC | PRN
  Administered 2012-10-29 (×3): 10 mg via INTRAVENOUS
  Administered 2012-10-29: 40 mg via INTRAVENOUS

## 2012-10-29 MED ORDER — LIDOCAINE HCL 4 % MT SOLN
OROMUCOSAL | Status: DC | PRN
  Administered 2012-10-29: 4 mL via TOPICAL

## 2012-10-29 SURGICAL SUPPLY — 64 items
APPLIER CLIP 9.375 MED OPEN (MISCELLANEOUS)
ATTRACTOMAT 16X20 MAGNETIC DRP (DRAPES) ×3 IMPLANT
BENZOIN TINCTURE PRP APPL 2/3 (GAUZE/BANDAGES/DRESSINGS) ×3 IMPLANT
BLADE SURG 10 STRL SS (BLADE) ×3 IMPLANT
BLADE SURG 15 STRL LF DISP TIS (BLADE) ×2 IMPLANT
BLADE SURG 15 STRL SS (BLADE) ×1
CANISTER SUCTION 2500CC (MISCELLANEOUS) ×3 IMPLANT
CHLORAPREP W/TINT 26ML (MISCELLANEOUS) ×3 IMPLANT
CLIP APPLIE 9.375 MED OPEN (MISCELLANEOUS) IMPLANT
CLIP TI MEDIUM 24 (CLIP) ×3 IMPLANT
CLIP TI WIDE RED SMALL 24 (CLIP) ×3 IMPLANT
CLOTH BEACON ORANGE TIMEOUT ST (SAFETY) ×3 IMPLANT
CONT SPEC 4OZ CLIKSEAL STRL BL (MISCELLANEOUS) ×6 IMPLANT
COVER SURGICAL LIGHT HANDLE (MISCELLANEOUS) ×3 IMPLANT
CRADLE DONUT ADULT HEAD (MISCELLANEOUS) ×3 IMPLANT
DERMABOND ADVANCED (GAUZE/BANDAGES/DRESSINGS) ×1
DERMABOND ADVANCED .7 DNX12 (GAUZE/BANDAGES/DRESSINGS) ×2 IMPLANT
DRAIN CHANNEL 19F RND (DRAIN) IMPLANT
DRAPE CHEST BREAST 15X10 FENES (DRAPES) IMPLANT
DRAPE PED LAPAROTOMY (DRAPES) ×3 IMPLANT
DRAPE UTILITY 15X26 W/TAPE STR (DRAPE) ×6 IMPLANT
ELECT CAUTERY BLADE 6.4 (BLADE) ×3 IMPLANT
ELECT REM PT RETURN 9FT ADLT (ELECTROSURGICAL) ×3
ELECTRODE REM PT RTRN 9FT ADLT (ELECTROSURGICAL) ×2 IMPLANT
EVACUATOR SILICONE 100CC (DRAIN) IMPLANT
GAUZE SPONGE 4X4 16PLY XRAY LF (GAUZE/BANDAGES/DRESSINGS) ×3 IMPLANT
GLOVE BIO SURGEON STRL SZ7.5 (GLOVE) ×6 IMPLANT
GLOVE BIOGEL PI IND STRL 6.5 (GLOVE) ×2 IMPLANT
GLOVE BIOGEL PI IND STRL 7.5 (GLOVE) ×2 IMPLANT
GLOVE BIOGEL PI INDICATOR 6.5 (GLOVE) ×1
GLOVE BIOGEL PI INDICATOR 7.5 (GLOVE) ×1
GLOVE ECLIPSE 6.5 STRL STRAW (GLOVE) ×3 IMPLANT
GLOVE EUDERMIC 7 POWDERFREE (GLOVE) ×3 IMPLANT
GOWN PREVENTION PLUS XLARGE (GOWN DISPOSABLE) ×6 IMPLANT
GOWN STRL NON-REIN LRG LVL3 (GOWN DISPOSABLE) ×3 IMPLANT
HEMOSTAT SURGICEL 2X14 (HEMOSTASIS) ×3 IMPLANT
HEMOSTAT SURGICEL 2X4 FIBR (HEMOSTASIS) IMPLANT
KIT BASIN OR (CUSTOM PROCEDURE TRAY) ×3 IMPLANT
KIT ROOM TURNOVER OR (KITS) ×3 IMPLANT
NEEDLE HYPO 25GX1X1/2 BEV (NEEDLE) ×3 IMPLANT
NEEDLE HYPO 25X1 1.5 SAFETY (NEEDLE) ×3 IMPLANT
NS IRRIG 1000ML POUR BTL (IV SOLUTION) ×3 IMPLANT
PACK SURGICAL SETUP 50X90 (CUSTOM PROCEDURE TRAY) ×3 IMPLANT
PAD ARMBOARD 7.5X6 YLW CONV (MISCELLANEOUS) ×6 IMPLANT
PENCIL BUTTON HOLSTER BLD 10FT (ELECTRODE) ×3 IMPLANT
SHEARS HARMONIC 9CM CVD (BLADE) ×3 IMPLANT
SPECIMEN JAR MEDIUM (MISCELLANEOUS) IMPLANT
SPONGE GAUZE 4X4 12PLY (GAUZE/BANDAGES/DRESSINGS) IMPLANT
SPONGE INTESTINAL PEANUT (DISPOSABLE) ×3 IMPLANT
SPONGE LAP 18X18 X RAY DECT (DISPOSABLE) ×3 IMPLANT
SUT ETHILON 2 0 FS 18 (SUTURE) ×3 IMPLANT
SUT MNCRL AB 4-0 PS2 18 (SUTURE) ×3 IMPLANT
SUT MON AB 4-0 PC3 18 (SUTURE) IMPLANT
SUT SILK 2 0 (SUTURE) ×1
SUT SILK 2-0 18XBRD TIE 12 (SUTURE) ×2 IMPLANT
SUT SILK 3 0 (SUTURE) ×1
SUT SILK 3-0 18XBRD TIE 12 (SUTURE) ×2 IMPLANT
SUT VIC AB 3-0 SH 18 (SUTURE) ×3 IMPLANT
SYR BULB 3OZ (MISCELLANEOUS) ×3 IMPLANT
SYR CONTROL 10ML LL (SYRINGE) ×3 IMPLANT
TOWEL OR 17X24 6PK STRL BLUE (TOWEL DISPOSABLE) ×3 IMPLANT
TOWEL OR 17X26 10 PK STRL BLUE (TOWEL DISPOSABLE) ×3 IMPLANT
TUBE CONNECTING 12X1/4 (SUCTIONS) ×3 IMPLANT
WATER STERILE IRR 1000ML POUR (IV SOLUTION) IMPLANT

## 2012-10-29 NOTE — Interval H&P Note (Signed)
History and Physical Interval Note:  10/29/2012 8:46 AM  Lisa Bolton  has presented today for surgery, with the diagnosis of THYROID cancer   The goals and the various methods of treatment have been discussed with the patient and family. After consideration of risks, benefits and other options for treatment, the patient has consented to  Procedure(s) (LRB) with comments: THYROID LOBECTOMY (Left) - left thyroid lobectomy,central compartmental lymph node dissection LYMPH NODE DISSECTION (N/A) - central compartment lymph node dissection  as a surgical intervention .  The patient's history has been reviewed, patient examined today, no change in status, stable for surgery.  I have reviewed the patient's chart and labs.  Questions were answered to the patient's satisfaction.     [name]

## 2012-10-29 NOTE — Telephone Encounter (Signed)
Called patient and advised that Dr. Derrell Lolling wanted her to be seen next week. Set her up to see him on Friday 11/05/12 at 5:00. I let her know that Dr. Derrell Lolling wanted her TSH and calcium checked before the appointment. I advised the patient that she will need to have the test on Thursday 11/04/12, and the copy of the request was being mailed. However, if for some reason she does not get the paperwork in enough time she can go to [facility] and they will have the information in the system.   Test was submitted as STAT with notation in comments the patient was coming in Thursday for testing and results needed prior to patient being seen in our office Friday.  Patient called back and was given the main number to [facility]. Patient stated she would be in [place] with her parents and wanted to know if she could be seen at a facility there instead of driving all the way to [place].

## 2012-10-29 NOTE — Transfer of Care (Signed)
Immediate Anesthesia Transfer of Care Note  Patient: [name]  Procedure(s) Performed: Procedure(s) (LRB) with comments: THYROID LOBECTOMY (Left) - left thyroid lobectomy,central compartmental lymph node dissection LYMPH NODE DISSECTION (N/A) - central compartment lymph node dissection   Patient Location: PACU  Anesthesia Type:General  Level of Consciousness: awake, alert  and oriented  Airway & Oxygen Therapy: Patient Spontanous Breathing  Post-op Assessment: Report given to PACU RN and Post -op Vital signs reviewed and stable  Post vital signs: Reviewed and stable  Complications: No apparent anesthesia complications

## 2012-10-29 NOTE — Anesthesia Procedure Notes (Signed)
Procedure Name: Intubation Date/Time: 10/29/2012 9:13 AM Performed by: Arlice Colt B Pre-anesthesia Checklist: Patient identified, Emergency Drugs available, Suction available, Patient being monitored and Timeout performed Patient Re-evaluated:Patient Re-evaluated prior to inductionOxygen Delivery Method: Circle system utilized Preoxygenation: Pre-oxygenation with 100% oxygen Intubation Type: IV induction Ventilation: Mask ventilation without difficulty Laryngoscope Size: Mac and 3 Grade View: Grade I Tube type: Oral Tube size: 7.0 mm Number of attempts: 1 Airway Equipment and Method: Stylet and LTA kit utilized Placement Confirmation: ETT inserted through vocal cords under direct vision,  positive ETCO2 and breath sounds checked- equal and bilateral Secured at: 22 cm Tube secured with: Tape Dental Injury: Teeth and Oropharynx as per pre-operative assessment

## 2012-10-29 NOTE — Anesthesia Postprocedure Evaluation (Signed)
  Anesthesia Post-op Note  Patient: Lisa Bolton  Procedure(s) Performed: Procedure(s) (LRB) with comments: THYROID LOBECTOMY (Left) - left thyroid lobectomy,central compartmental lymph node dissection LYMPH NODE DISSECTION (N/A) - central compartment lymph node dissection   Patient Location: PACU  Anesthesia Type:General  Level of Consciousness: awake, alert , oriented and patient cooperative  Airway and Oxygen Therapy: Patient Spontanous Breathing  Post-op Pain: mild  Post-op Assessment: Post-op Vital signs reviewed, Patient's Cardiovascular Status Stable, Respiratory Function Stable, Patent Airway, No signs of Nausea or vomiting and Pain level controlled  Post-op Vital Signs: Reviewed and stable  Complications: No apparent anesthesia complications

## 2012-10-29 NOTE — Op Note (Signed)
Patient Name:           Lisa Bolton   Date of Surgery:        10/29/2012  Pre op Diagnosis:      Papillary carcinoma of the right lobe of the thyroid, pathologic stage T2,NX.  Post op Diagnosis:    Same  Procedure:                 Completion left thyroid lobectomy, central compartment lymph node dissection  Surgeon:                     Angelia Mould. Derrell Lolling, M.D., FACS  Assistant:                      Axel Filler, M.D.  Operative Indications:   Lisa Bolton is a 27 y.o. female.   The patient was originally referred for a palpable nodule of the right thyroid lobe. She had no risk factors. 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  On 09/21/2012 she underwent right thyroid lobectomy. She  recovered from the surgery uneventfully. Voice is normal. Swallowing normally. No paresthesias. Final pathology report showed well-differentiated papillary thyroid carcinoma. I discussed this with her by phone. She wanted to go ahead and schedule the completion thyroidectomy and central compartment neck dissection. We discussed this with the patient and with her parents preoperatively.  We spent approximately 45 minutes discussing the diagnosis of thyroid cancer. The well-differentiated nature of her cancer. The logic behind completion thyroidectomy, issues surrounding postoperative RAI ablation, and long-term Synthroid use. She is aware that she will need to be involved with an endocrinologist, who will supervise her RAI treatment and manage her Synthroid dose and assist with long-term surveillance..She has had a consultation with Dr. Adrian Prince in Dr. Onnie Boer office, , And he is going to manage her postoperative radioiodine ablation and thyroid hormone replacement therapy.. The patient and her parents and comfortable following all of this discussion.    Operative Findings:       The left thyroid lobe was very small and very soft without evidence of nodule or  malignancy. There was minimal pretracheal and central compartment fatty tissue. We took what we could always down to the head of the clavicles above the innominate vein.  Procedure in Detail:          Following the induction of general endotracheal anesthesia the patient was positioned with her arms at her sides and a roll behind her shoulders. This allowed the neck to extend and expose the thyroidectomy incision. The neck and upper chest were prepped and draped in a sterile fashion. Surgical time out was performed. Intravenous antibiotics were given. 0.5% Marcaine with epinephrine was infiltrated into the skin and subcutaneous tissue. Transverse incision was made through the old scar. Dissection was carried down through the subcutaneous tissue and through the platysma muscle. We cut  the old 0 Vicryl sutures out. We elevated the skin and platysma flaps as far as possible and then placed a self-retaining retractor. We divided the strap muscles in the midline and dissected them a little bit to the right but mostly to the left and slowly dissected them off of the left thyroid lobe. This dissection was somewhat slow and tedious because the tissues were adherent to each other. Once we had gained the inferior and lateral borders of the thyroid we took the dissection up to the superior pole. We isolated the  superior pole vessels. We identified the superior parathyroid gland and preserved that. We divided the superior pole vessels after placing a metal clip above using the Harmonic Scalpel. We mobilized the lower pole of the thyroid by dividing some of its venous attachment. We then slowly mobilized the left thyroid lobe from lateral to medial. The tissues were somewhat thickened because of her previous surgery but we did identify  where we thought the recurrent laryngeal nerve was involved; we could see it coming out into the usual insertion below the thyroid cartilage. We slowly dissected the thyroid off at the  ligament of Berry. We probably left a little bit of thyroid tissue just to be safe. We did not identify the inferior parathyroid gland and we felt the it was well lateral to the dissection. We then further mobilized and rolled the thyroid gland up to the midline of the trachea, removed it and sent it  to the lab for routine histology. I then dissected out the pretracheal fat from the mid trachea down to the suprasternal notch. We stayed away from the innominate vein which could be seen and we sent a small packet of fatty tissue to the lab labeled central compartment node dissection. Hemostasis was excellent. We irrigated the wound. We placed some fibrillar hemostatic sponge in the bed of the thyroid and then the suprasternal notch area. There was no bleeding. The strap muscles were closed in the midline with interrupted sutures of 3-0 Vicryl and the platysma was then closed with interrupted 3-0 Vicryl sutures. The skin was closed with a running subcuticular suture of 4-0 Monocryl and Dermabond. The patient tolerated the procedure well. There were no complications. Counts were correct. EBL 20 cc.     Angelia Mould. Derrell Lolling, M.D., FACS General and Minimally Invasive Surgery Breast and Colorectal Surgery  10/29/2012 11:01 AM

## 2012-10-30 LAB — CBC
Hemoglobin: 11.3 g/dL — ABNORMAL LOW (ref 12.0–15.0)
MCHC: 33.2 g/dL (ref 30.0–36.0)
Platelets: 259 10*3/uL (ref 150–400)
RDW: 14.7 % (ref 11.5–15.5)

## 2012-10-30 LAB — CALCIUM: Calcium: 9.2 mg/dL (ref 8.4–10.5)

## 2012-10-30 NOTE — Progress Notes (Signed)
1 Day Post-Op  Subjective: Feels well, heart racing at times but otherwise ok  Objective: Vital signs in last 24 hours: Temp:  [97.9 F (36.6 C)-98.1 F (36.7 C)] 97.9 F (36.6 C) (01/25 0630) Pulse Rate:  [63-120] 98  (01/25 0630) Resp:  [10-20] 16  (01/25 0630) BP: (114-138)/(63-92) 127/87 mmHg (01/25 0630) SpO2:  [97 %-100 %] 97 % (01/25 0630) Weight:  [263 lb 14.3 oz (119.7 kg)] 263 lb 14.3 oz (119.7 kg) (01/24 1405) Last BM Date: 10/29/12  Intake/Output from previous day: 01/24 0701 - 01/25 0700 In: 1800 [I.V.:1800] Out: 3220 [Urine:3200; Blood:20] Intake/Output this shift:    General appearance: no distress Neck: wound clean no hematoma  Lab Results:   [facility] 10/30/12 0555  WBC 8.5  HGB 11.3*  HCT 34.0*  PLT 259   BMET  Basename 10/30/12 0555 10/29/12 1335  NA -- --  K -- --  CL -- --  CO2 -- --  GLUCOSE -- --  BUN -- --  CREATININE -- --  CALCIUM 9.2 8.9    Assessment/Plan: POD 1 completion thyroid  Feels well, calciums good want to go home Some tachycardia while sleeping but hct fine no hematoma, asx, I think fine to go home.   [facility] 10/30/2012

## 2012-10-30 NOTE — Progress Notes (Signed)
Pt apical HR 115, all other VSS. MD on call [facility] notified. Orders given for CBC in am. Will continue to monitor.

## 2012-10-30 NOTE — Progress Notes (Signed)
Pt. Received discharge instructions with mother at her side.  Pt. Asking appropriate questions at this time.  Reviewed medications and importance of calcium / potassium / iodine balance in her diet.  Writer assisted patient in signing into "My Chart" charting system.  'Teach Back' method employed and was effective.  Next appointment dates as well as incision care also discussed.  Pt. Discharged to private home with mother.  Escorted to exit in wheelchair by nurse tech.

## 2012-11-01 ENCOUNTER — Telehealth (INDEPENDENT_AMBULATORY_CARE_PROVIDER_SITE_OTHER): Payer: Self-pay | Admitting: General Surgery

## 2012-11-01 ENCOUNTER — Encounter (HOSPITAL_COMMUNITY): Payer: Self-pay | Admitting: General Surgery

## 2012-11-01 NOTE — Progress Notes (Signed)
Quick Note:  Inform patient of Pathology report,. Tell her no cancer in thyroid or nodes. I will see her later this week. She needs to make appt. with Dr. Ronna Polio should know this). ______

## 2012-11-01 NOTE — Telephone Encounter (Signed)
Patient called because he is status post thyroidectomy with an appt for follow up this Friday. She was told to make an appt with her endocrinologist for two weeks after surgery - she has an appt with Dr Evlyn Kanner on 11/10/2012 but was told that he will be out of town from 11/15/2012 until 11/29/12. She is not on any thyroid hormones and was supposed to be receiving radioactive iodine treatments. She doesn't know if she should wait two weeks to receive the treatment if she is not on any synthroid. She is wondering if she should see another endocrinologist or what to do. Please advise.

## 2012-11-01 NOTE — Telephone Encounter (Signed)
Called patient back and advised per Dr. Derrell Lolling:  "Tell her to keep appt. With Dr. Evlyn Kanner on 2/5. He will decide whether to proceed with treatment or place her on synthroid until he returns. She should discuss her interest in another endocrinologist with her PCP, Dr. Neale Burly."  Patient agreed.

## 2012-11-01 NOTE — Telephone Encounter (Signed)
Called patient and advised of pathology per Dr. Derrell Lolling "Tell her no cancer in thyroid or nodes. I will see her later this week. She needs to make appointment with Dr. Evlyn Kanner".  Patient said she made appointment with Dr. Evlyn Kanner for 11/11/11. However, Dr. Evlyn Kanner will be out of the office two weeks after that (week of the 10th and 17th) Patient concerned because she has anxiety and not taking any hormones. Patient not sure if the appointment with Dr. Evlyn Kanner makes sense due to not being on any treatment at this point. She is considering another endocrinologist that two of her friends go to in order to be evaluated. I told the patient that if she wants to contact that doctor to check their availability she could, but right now keep appointment with Dr. Evlyn Kanner. I advised the patient I would forward her concern to Dr. Derrell Lolling and get back to her with a response. Patient agreed.

## 2012-11-02 ENCOUNTER — Other Ambulatory Visit (INDEPENDENT_AMBULATORY_CARE_PROVIDER_SITE_OTHER): Payer: Self-pay | Admitting: General Surgery

## 2012-11-02 ENCOUNTER — Telehealth (INDEPENDENT_AMBULATORY_CARE_PROVIDER_SITE_OTHER): Payer: Self-pay | Admitting: General Surgery

## 2012-11-02 LAB — CALCIUM: Calcium: 9.6 mg/dL (ref 8.4–10.5)

## 2012-11-02 NOTE — Telephone Encounter (Signed)
Called patient to advise per Dr. Derrell Lolling that her calcium level was normal today. Based on the test at [facility]. Patient stated she will continue to take the calcium twice per day because that is what she was taking before the test was taken. I advised the patient to keep her appointment on 11/05/12 and that if based on the remaining test results Dr. Derrell Lolling feels there needs to be a change in dosage he will let her know at that time. Patient agreed.

## 2012-11-02 NOTE — Telephone Encounter (Signed)
Pt called earlier this am to report that when she is in bed at night she notices tingling in hands and feet for short periods, does not last all night, this started several nights ago. No tingling noted when up during the day. No other symptoms reported. She is taking 2000mg /TUMs BID. I reviewed this with Dr. Derrell Lolling. He said for pt to increase TUMS dose to 3 x a day and have serum calcium done today. Pt is in [place] so orders for Calcium and TSH were faxed to [facility] [phone] to be done stat. TSH was to be done before her post-op visit so this will be done today also. [facility] will fax results/ gy/Pt notified of  Dr. Aura Camps order/gy

## 2012-11-03 LAB — TSH: TSH: 30.115 u[IU]/mL — ABNORMAL HIGH (ref 0.350–4.500)

## 2012-11-05 ENCOUNTER — Other Ambulatory Visit (INDEPENDENT_AMBULATORY_CARE_PROVIDER_SITE_OTHER): Payer: Self-pay | Admitting: General Surgery

## 2012-11-05 ENCOUNTER — Ambulatory Visit (INDEPENDENT_AMBULATORY_CARE_PROVIDER_SITE_OTHER): Payer: Commercial Managed Care - PPO | Admitting: General Surgery

## 2012-11-05 ENCOUNTER — Encounter (INDEPENDENT_AMBULATORY_CARE_PROVIDER_SITE_OTHER): Payer: Self-pay | Admitting: General Surgery

## 2012-11-05 VITALS — BP 124/82 | HR 64 | Temp 97.0°F | Resp 12 | Ht 63.5 in | Wt 259.2 lb

## 2012-11-05 DIAGNOSIS — C73 Malignant neoplasm of thyroid gland: Secondary | ICD-10-CM

## 2012-11-05 NOTE — Patient Instructions (Signed)
Your calcium level on January 28 was 9.6, which is normal.  You may cut the calcium tablets down to one tablet twice a day  My office will arrange for you to have a blood calcium level in 2 weeks. I'll call you and tell you whether you may cut back on the calcium further at that time.  Be sure to keep your appointment with Dr. Adrian Prince next week. He will coordinate your radioiodine treatment.  Return to see Dr. Derrell Lolling in 6 weeks.

## 2012-11-05 NOTE — Progress Notes (Signed)
Patient ID: Lisa Bolton, female   DOB: 01/27/1986, 27 y.o.   MRN: [id number] History: This patient  has papillary carcinoma of the right lobe of the thyroid, stage T2, N0. She originally underwent a right thyroid lobectomy which showed a well-differentiated papillary thyroid carcinoma. On 10/29/2012 she underwent a completion left thyroid lobectomy and central compartment lymph node dissection. There was one benign lymph node. The left thyroid lobe showed no malignancy. She is doing well. Her voice is normal. She has no wound problems. She had one episode of paresthesias 3 days ago. On January 28 her calcium level was 9.6 and TSH was 30.115. She has an appointment to see Dr. Adrian Prince next week who will coordinate her body scans and radioiodine ablation.  Exam: Patient looks well. Voice is strong.  neck incision is healing normally. Normal amount of puffiness, no sign of hematoma or infection Chvostek's sign negative bilaterally  Assessment: Papillary thyroid cancer, right thyroid lobe recovering uneventfully following right thyroid lobectomy and subsequent left lower lobectomy and central compartment and a biopsy Suspect normal parathyroid function.  Plan: She will reduce her calcium intake to 1 large Tums twice a day Repeat serum calcium in 2 weeks to see if we can further taper off of calcium She will see Dr. Evlyn Kanner next week to coordinate radioiodine ablation and subsequent resumption of thyroid hormone supplementation. Return to see me in 6 weeks, sooner if there are any problems.   Angelia Mould. Derrell Lolling, M.D., [facility] General and Minimally invasive Surgery Breast and Colorectal Surgery Office:   [phone] Pager:   [phone]

## 2012-11-10 ENCOUNTER — Other Ambulatory Visit (HOSPITAL_COMMUNITY): Payer: Self-pay | Admitting: Endocrinology

## 2012-11-10 DIAGNOSIS — C73 Malignant neoplasm of thyroid gland: Secondary | ICD-10-CM

## 2012-11-12 ENCOUNTER — Encounter (HOSPITAL_COMMUNITY)
Admission: RE | Admit: 2012-11-12 | Discharge: 2012-11-12 | Disposition: A | Discharge status: Home / Self Care | Payer: 59 | Source: Ambulatory Visit | Attending: Endocrinology | Admitting: Endocrinology

## 2012-11-12 DIAGNOSIS — C73 Malignant neoplasm of thyroid gland: Secondary | ICD-10-CM | POA: Insufficient documentation

## 2012-11-15 ENCOUNTER — Encounter (HOSPITAL_COMMUNITY)
Admission: RE | Admit: 2012-11-15 | Discharge: 2012-11-15 | Disposition: A | Discharge status: Home / Self Care | Payer: 59 | Source: Ambulatory Visit | Attending: Endocrinology | Admitting: Endocrinology

## 2012-11-15 ENCOUNTER — Telehealth (INDEPENDENT_AMBULATORY_CARE_PROVIDER_SITE_OTHER): Payer: Self-pay | Admitting: General Surgery

## 2012-11-15 NOTE — Telephone Encounter (Signed)
Pt has noted an area at the end of her incision that is "hard and like a pimple."  She thinks it may be a suture trying to poke through the skin.  Reassured pt that as the initial swelling has resolved, there may be a suture underlying that area.  Keep an eye on it and call back as needed, especially if any signs of infection appear.  She understands.

## 2012-11-16 ENCOUNTER — Encounter (INDEPENDENT_AMBULATORY_CARE_PROVIDER_SITE_OTHER): Payer: Self-pay

## 2012-11-16 ENCOUNTER — Encounter (INDEPENDENT_AMBULATORY_CARE_PROVIDER_SITE_OTHER): Payer: Commercial Managed Care - PPO | Admitting: General Surgery

## 2012-11-16 NOTE — Progress Notes (Signed)
The pt came in concerned about her incision.  She has an area at the end on the left side that is a little red and she was concerned for pus.  She noticed it Saturday when she took off the glue.  She thought it had a head to it.  I felt a little sharp end like a suture.  The rest of the incision felt like the expected firmness from subcutaneous sutures.  I took her vitals.  Bp 110/82, pulse 84, Resp 18, temp 98.4.  I asked Dr Derrell Lolling to come in and see the pt as he is in the office.  He saw a suture poking out and with a suture removal kit he clipped the end of it.  He instructed her to keep a bandaid on it.  She can shower.  She has a follow up in March.  I asked her to call with any further concerns.

## 2012-11-19 ENCOUNTER — Other Ambulatory Visit (HOSPITAL_COMMUNITY): Payer: Self-pay | Admitting: Endocrinology

## 2012-11-19 ENCOUNTER — Telehealth (INDEPENDENT_AMBULATORY_CARE_PROVIDER_SITE_OTHER): Payer: Self-pay

## 2012-11-19 ENCOUNTER — Encounter (HOSPITAL_COMMUNITY): Payer: 59

## 2012-11-19 DIAGNOSIS — C73 Malignant neoplasm of thyroid gland: Secondary | ICD-10-CM

## 2012-11-19 NOTE — Telephone Encounter (Signed)
Pt is calling to report that she is hearing a gurgling sound coming from her throat when she lies down at night since her thyroid sx. The pt is concerned about her doing the radio iodine test on Monday so she is concerned. The pt does have some fullness on the neck from the surgery.

## 2012-11-20 ENCOUNTER — Other Ambulatory Visit: Payer: Self-pay

## 2012-11-22 ENCOUNTER — Encounter (HOSPITAL_COMMUNITY): Payer: 59

## 2012-11-22 ENCOUNTER — Telehealth (INDEPENDENT_AMBULATORY_CARE_PROVIDER_SITE_OTHER): Payer: Self-pay | Admitting: *Deleted

## 2012-11-22 ENCOUNTER — Encounter (HOSPITAL_COMMUNITY)
Admission: RE | Admit: 2012-11-22 | Discharge: 2012-11-22 | Disposition: A | Discharge status: Home / Self Care | Payer: 59 | Source: Ambulatory Visit | Attending: Endocrinology | Admitting: Endocrinology

## 2012-11-22 DIAGNOSIS — C73 Malignant neoplasm of thyroid gland: Secondary | ICD-10-CM | POA: Insufficient documentation

## 2012-11-22 LAB — HCG, SERUM, QUALITATIVE: Preg, Serum: NEGATIVE

## 2012-11-22 MED ORDER — SODIUM IODIDE I 131 CAPSULE
74.6000 | Freq: Once | INTRAVENOUS | Status: AC | PRN
  Administered 2012-11-22: 74.6 via ORAL

## 2012-11-22 NOTE — Telephone Encounter (Signed)
Patient called to state that the "gurgling" is improving.  Patient states she does have a fullness feeling in her head.  Patient instructed to try to use ice and NSAIDs to help with the swelling.  Patient states understanding.  Patient also encouraged to keep appt this morning for radio iodine test and states she will be going.

## 2012-12-02 ENCOUNTER — Encounter (HOSPITAL_COMMUNITY)
Admission: RE | Admit: 2012-12-02 | Discharge: 2012-12-02 | Disposition: A | Discharge status: Home / Self Care | Payer: 59 | Source: Ambulatory Visit | Attending: Endocrinology | Admitting: Endocrinology

## 2012-12-02 DIAGNOSIS — C73 Malignant neoplasm of thyroid gland: Secondary | ICD-10-CM | POA: Insufficient documentation

## 2012-12-14 ENCOUNTER — Encounter (INDEPENDENT_AMBULATORY_CARE_PROVIDER_SITE_OTHER): Payer: Self-pay | Admitting: General Surgery

## 2012-12-14 ENCOUNTER — Telehealth (INDEPENDENT_AMBULATORY_CARE_PROVIDER_SITE_OTHER): Payer: Self-pay

## 2012-12-14 ENCOUNTER — Ambulatory Visit (INDEPENDENT_AMBULATORY_CARE_PROVIDER_SITE_OTHER): Payer: Commercial Managed Care - PPO | Admitting: General Surgery

## 2012-12-14 VITALS — BP 120/78 | HR 78 | Temp 97.6°F | Resp 18 | Ht 63.2 in | Wt 256.0 lb

## 2012-12-14 DIAGNOSIS — C73 Malignant neoplasm of thyroid gland: Secondary | ICD-10-CM

## 2012-12-14 NOTE — Telephone Encounter (Signed)
I called to check on where the pt's Calcium level is that she had drawn yesterday.  When I called [facility] [phone], they did not see anything but a phlebotomy charge from yesterday.  She told me to call [phone] which I did.  I finally spoke to Hope there who confirmed the blood was drawn yesterday.  It was resulted to the order date which was 11/05/12.  I saw in Epic where the resulted date and time were yesterday's date.  She will try to fix the specimen collected date and time to reflect accuracy.  She says it happens like that when the order is placed so far in advance.

## 2012-12-14 NOTE — Patient Instructions (Signed)
TYou appear to have recovered from your second thyroid surgery without any surgical complications.  Your calcium level yesterday is 9.3. That is normal. You may slowly wean off of calcium altogether, if you would like.  Your recent full-body thyroid scan shows a little bit of uptake in the thyroid bed. Please discuss this with Dr. Evlyn Kanner.  Please make an appointment with your primary care physician, Dr. Neale Burly, to discuss your problems with occasional irregular heart rate and insomnia.  Return to see Dr. Derrell Lolling in 6 months.

## 2012-12-14 NOTE — Progress Notes (Signed)
Patient ID: Lisa Bolton, female   DOB: 04-22-1986, 27 y.o.   MRN: [id number] History: This patient returns for postop followup regarding her thyroid cancer. She underwent right thyroid lobectomy for presumably benign disease but we found a small well-differentiated papillary carcinoma. On 10/29/2012 she underwent left thyroid completion lobectomy and central compartment node dissection. Final pathologic stage T2, N0. She has recovered fairly well. No voice change. No swallowing problems. She has tapered down to 2 Tums a day. Calcium level is 9.3 on 12/13/2012. She does complain of anxiety which is chronic, insomnia and irregular heart rate, which has happened before.She asked if she can continue to take Klonopin for her anxiety  And I told her that would be fine. She asked if she can take Benadryl for sleep at night like she has in the past I told her that would be fine also.  She completed her radioiodine ablation 3 weeks ago. Dr. Evlyn Kanner is increasing her thyroid hormone replacement up and she is to go up to 125 mcg per day now.. She had a full body scan on February 27 which is a little bit of residual uptake in the thyroid bed but no metastasis or adenopathy. This is felt to be due to residual thyroid tissue.     Exam: Patient looks well, very pleasant. A little bit anxious but this is baseline. Weight is 256. Neck reveals incision to be well-healed. No adenopathy. Which is strong she phonates well. Chvostek's sign negative bilaterally.   Assessment: Papillary carcinoma of the thyroid, now status post total thyroidectomy and central compartment node dissection, stage T2, N0 No apparent surgical complications Status post recent radioiodine ablation No evidence of hypo-parathyroidism   Plan: She may wean off of calcium as tolerated Follow instructions on Synthroid outlined by Dr. Evlyn Kanner I advised her to discuss the total body scan results and future surveillance with Dr. Evlyn Kanner  Discuss her  problems with insomnia, anxiety, an irregular heart rate with Dr. Neale Burly. Return to see me in 6 months.     Angelia Mould. Derrell Lolling, M.D., [facility] General and Minimally invasive Surgery Breast and Colorectal Surgery Office:   [phone] Pager:   [phone]

## 2012-12-15 ENCOUNTER — Ambulatory Visit (INDEPENDENT_AMBULATORY_CARE_PROVIDER_SITE_OTHER): Payer: 59 | Admitting: Family Medicine

## 2012-12-15 ENCOUNTER — Telehealth (INDEPENDENT_AMBULATORY_CARE_PROVIDER_SITE_OTHER): Payer: Self-pay | Admitting: General Surgery

## 2012-12-15 VITALS — BP 134/86 | HR 86 | Temp 97.9°F | Resp 16 | Ht 63.58 in | Wt 258.6 lb

## 2012-12-15 DIAGNOSIS — J019 Acute sinusitis, unspecified: Secondary | ICD-10-CM

## 2012-12-15 DIAGNOSIS — J3489 Other specified disorders of nose and nasal sinuses: Secondary | ICD-10-CM

## 2012-12-15 DIAGNOSIS — J040 Acute laryngitis: Secondary | ICD-10-CM

## 2012-12-15 DIAGNOSIS — R0981 Nasal congestion: Secondary | ICD-10-CM

## 2012-12-15 MED ORDER — AMOXICILLIN-POT CLAVULANATE 875-125 MG PO TABS: 1.0000 | ORAL_TABLET | Freq: Two times a day (BID) | ORAL | Status: DC

## 2012-12-15 MED ORDER — FLUTICASONE PROPIONATE 50 MCG/ACT NA SUSP: 2.0000 | Freq: Every day | NASAL | Status: DC

## 2012-12-15 NOTE — Telephone Encounter (Signed)
Pt called to report she has a cold (URI) with drainage and coughing.  She sounds very hoarse when speaking.  Denies sore throat, but is developing laryngitis.  Pt asks if OK to gargle with warm salt water and instructed she may do this safely now.  She will call back as needed.

## 2012-12-15 NOTE — Patient Instructions (Signed)

## 2012-12-15 NOTE — Progress Notes (Signed)
[facility]:  Office Visit  Chief Complaint:  Chief Complaint  Patient presents with  . URI    x   today    HPI: Lisa Bolton is a 27 y.o. female who complains of cold sxs on Saturday night, especially sinus pressure HA. + sinus fullness worse since today.She has had yellow/green productive nasal and green productive cough. She has had chills, perhaps subjective fever. Denies ear pain, + ear fullness.  Few weeks earlier had left temporal pressure s/p surgery for thyroid cancer. Has not tried anything for this except saline nasal sprays and also nasal gel since her mouth and nasal passage has been so dry . She has been drinking lots of water to keep hydrated. Her mouth and nasal passage has been dry.  Denies diabetes.   Past Medical History  Diagnosis Date  . Thyroid nodule   . Vaginal discharge     chronic  . Recurrent boils     on vulva  . Anemia   . Chest pain   . Panic attacks   . Family history of anesthesia complication     UNCLE WITH  PSUEDOCHOLINESTEROSE  DEFIENCY  . GERD (gastroesophageal reflux disease)   . Cancer     thyroid  . Shortness of breath     with exertion   Past Surgical History  Procedure Laterality Date  . Anterior cruciate ligament repair  2001 - approximate    left  . Thyroid lobectomy  09/21/2012    Procedure: THYROID LOBECTOMY;  Surgeon: [name], MD;  Location: [facility];  Service: General;  Laterality: Right;  Right Thyroid Lobectomy  . Thyroid lobectomy  10/29/2012    Procedure: THYROID LOBECTOMY;  Surgeon: [name], MD;  Location: [facility] OR;  Service: General;  Laterality: Left;  left thyroid lobectomy,central compartmental lymph node dissection  . Lymph node dissection  10/29/2012    Procedure: LYMPH NODE DISSECTION;  Surgeon: [name], MD;  Location: [facility] OR;  Service: General;  Laterality: N/A;  central compartment lymph node dissection    History   Social History  . Marital Status: Single    Spouse  Name: N/A    Number of Children: N/A  . Years of Education: N/A   Social History Main Topics  . Smoking status: Never Smoker   . Smokeless tobacco: Never Used  . Alcohol Use: 2.4 oz/week    2 Glasses of wine, 2 Cans of beer per week     Comment: socially  . Drug Use: No  . Sexually Active: Yes    Birth Control/ Protection: Abstinence   Other Topics Concern  . None   Social History Narrative  . None   Family History  Problem Relation Age of Onset  . Muscular dystrophy Mother   . Hypertension Mother   . Heart attack Mother   . Thyroid cancer Mother   . Anxiety disorder Brother   . Arthritis Maternal Grandmother   . Parkinson's disease Maternal Grandmother   . Heart disease Paternal Grandmother   . Heart attack Paternal Grandfather    Allergies  Allergen Reactions  . Other Other (See Comments)    Anethesia   Prior to Admission medications   Medication Sig Start Date End Date Taking? Authorizing Provider  calcium carbonate (TUMS) 500 MG chewable tablet Chew 1 tablet by mouth daily.   Yes Historical Provider, MD  clonazePAM (KLONOPIN) 0.5 MG tablet Take 0.5 mg by mouth 2 (two) times daily as  needed. For anxiety   Yes Historical Provider, MD  levothyroxine (SYNTHROID, LEVOTHROID) 125 MCG tablet Take 125 mcg by mouth daily. Pt taking 3/4 tab   Yes Historical Provider, MD     ROS: The patient denies, night sweats, unintentional weight loss, chest pain, palpitations, wheezing, dyspnea on exertion, nausea, vomiting, abdominal pain, dysuria, hematuria, melena, numbness, weakness, or tingling.   All other systems have been reviewed and were otherwise negative with the exception of those mentioned in the HPI and as above.    PHYSICAL EXAM: Filed Vitals:   12/15/12 2014  BP: 134/86  Pulse: 86  Temp: 97.9 F (36.6 C)  Resp: 16   Filed Vitals:   12/15/12 2014  Height: 5' 3.58" (1.615 m)  Weight: 258 lb 9.6 oz (117.3 kg)   Body mass index is 44.97 kg/(m^2).  General:  Alert, no acute distress HEENT:  Normocephalic, atraumatic, oropharynx patent. + sinus tenderness, TM nl, NO exudates. + boggy, erythematous  nares.  Cardiovascular:  Regular rate and rhythm, no rubs murmurs or gallops.  No Carotid bruits, radial pulse intact. No pedal edema.  Respiratory: Clear to auscultation bilaterally.  No wheezes, rales, or rhonchi.  No cyanosis, no use of accessory musculature GI: No organomegaly, abdomen is soft and non-tender, positive bowel sounds.  No masses. Skin: No rashes. Neurologic: Facial musculature symmetric. Psychiatric: Patient is appropriate throughout our interaction. Lymphatic: No cervical lymphadenopathy Musculoskeletal: Gait intact.   LABS: Results for orders placed during the hospital encounter of 11/22/12  HCG, SERUM, QUALITATIVE      Result Value Range   Preg, Serum NEGATIVE  NEGATIVE     EKG/XRAY:   Primary read interpreted by Dr. Conley Rolls at [facility].   ASSESSMENT/PLAN: Encounter Diagnoses  Name Primary?  . Acute sinusitis Yes  . Laryngitis   . Nasal congestion     Rx Augmentin Rx Flonase C/w saline nasal sprays or netty pot F/u prn   LE, THAO PHUONG, DO 12/15/2012 9:16 PM

## 2012-12-16 ENCOUNTER — Encounter (INDEPENDENT_AMBULATORY_CARE_PROVIDER_SITE_OTHER): Payer: Commercial Managed Care - PPO | Admitting: General Surgery

## 2012-12-22 ENCOUNTER — Other Ambulatory Visit: Payer: Self-pay | Admitting: *Deleted

## 2012-12-22 DIAGNOSIS — R002 Palpitations: Secondary | ICD-10-CM

## 2012-12-22 DIAGNOSIS — R9431 Abnormal electrocardiogram [ECG] [EKG]: Secondary | ICD-10-CM

## 2012-12-28 ENCOUNTER — Telehealth: Payer: Self-pay | Admitting: *Deleted

## 2012-12-28 ENCOUNTER — Encounter (INDEPENDENT_AMBULATORY_CARE_PROVIDER_SITE_OTHER): Payer: 59

## 2012-12-28 DIAGNOSIS — R002 Palpitations: Secondary | ICD-10-CM

## 2012-12-28 DIAGNOSIS — R9431 Abnormal electrocardiogram [ECG] [EKG]: Secondary | ICD-10-CM

## 2012-12-28 NOTE — Telephone Encounter (Signed)
30 day event monitor placed on PT 12/28/12 TK

## 2013-06-14 ENCOUNTER — Encounter (INDEPENDENT_AMBULATORY_CARE_PROVIDER_SITE_OTHER): Payer: Self-pay | Admitting: General Surgery

## 2013-06-14 ENCOUNTER — Ambulatory Visit (INDEPENDENT_AMBULATORY_CARE_PROVIDER_SITE_OTHER): Payer: Commercial Managed Care - PPO | Admitting: General Surgery

## 2013-06-14 VITALS — BP 110/82 | HR 60 | Temp 97.0°F | Resp 18 | Ht 63.0 in | Wt 261.0 lb

## 2013-06-14 DIAGNOSIS — C73 Malignant neoplasm of thyroid gland: Secondary | ICD-10-CM

## 2013-06-14 NOTE — Progress Notes (Signed)
Patient ID: Lisa Bolton, female   DOB: August 15, 1986, 27 y.o.   MRN: [id number] History:  This patient returns for  followup regarding her thyroid cancer. She underwent right thyroid lobectomy on 09/21/2013 for presumably benign disease but we found a small well-differentiated papillary carcinoma. On 10/29/2012 she underwent left thyroid completion lobectomy and central compartment node dissection. Final pathologic stage T2, N0.  She has recovered fairly well. No voice change. No swallowing problems. Off calcium and no symptoms of hypocalcemia.     Nuclear medicine scan on every 14,014 showed some residual uptake in the thyroid bed. RAF therapy was initiated, also of debris 14 2014. She is feeling well. Her Synthroid dose has stabilized at 125 mcg per day. She sees Dr. Leafy Half every 6 months, having seen him about 3 weeks ago. She thyroglobulin drawn recently and is awaiting that result. . Dr. Evlyn Kanner is increasing her thyroid hormone replacement up and she is to go up to 125 mcg per day now.. She had a full body scan on February 27 which showed a little bit of residual uptake in the thyroid bed but no metastasis or adenopathy. This is felt to be due to residual thyroid tissue.  ROS: 10 system review of systems is negative except as described above. She does note that she is obese and states her desire to lose weight. She is going to discuss this with Dr. Neale Burly.  Exam: Patient looks well. Very pleasant. Overweight. Neck reveals incision well healed. Tissues are soft. No palpable mass. No bruit. No adenopathy anterior neck, posterior neck, posterior scalp. Lungs clear to auscultation bilaterally Heart regular rate and rhythm. No tachycardia. No ectopy Chvostek's  sign negative bilaterally  Assessment:  Papillary carcinoma of the thyroid, now status post total thyroidectomy and central compartment node dissection, stage T2, N0  No apparent surgical complications  Status post  radioiodine ablation   Surgically-induced hyperthyroidism, on stable replacement therapy, followed by Dr. Evlyn Kanner. No evidence of hypo-parathyroidism  Plan: Continue followup with Dr. Evlyn Kanner every 6 months or so. She will need thyroglobulin level periodically through his office and lab. He will also regulate her Synthroid dose. To discuss weight loss strategy with Dr. Neale Burly Return to see me in one year.    Lisa Bolton, M.D., [facility] General and Minimally invasive Surgery Breast and Colorectal Surgery Office:   [phone] Pager:   [phone]

## 2013-06-14 NOTE — Patient Instructions (Signed)
Your physical exam today reveals no evidence of thyroid cancer or enlarged lymph nodes.  Keep your regular appointments with Dr. Evlyn Kanner to regulate your Synthroid dose. He will check your thyroglobulin level periodically, perhaps every 6 months.  Return to see Dr. Derrell Lolling in one year, sooner if necessary.

## 2013-08-09 ENCOUNTER — Other Ambulatory Visit: Payer: Self-pay | Admitting: Gynecology

## 2013-08-11 ENCOUNTER — Other Ambulatory Visit: Payer: Self-pay

## 2013-12-05 ENCOUNTER — Other Ambulatory Visit: Payer: Self-pay | Admitting: Endocrinology

## 2013-12-05 DIAGNOSIS — R1310 Dysphagia, unspecified: Secondary | ICD-10-CM

## 2013-12-06 ENCOUNTER — Ambulatory Visit
Admission: RE | Admit: 2013-12-06 | Discharge: 2013-12-06 | Disposition: A | Discharge status: Home / Self Care | Payer: 59 | Source: Ambulatory Visit | Attending: Endocrinology | Admitting: Endocrinology

## 2013-12-06 DIAGNOSIS — R1310 Dysphagia, unspecified: Secondary | ICD-10-CM

## 2014-01-12 ENCOUNTER — Other Ambulatory Visit (HOSPITAL_COMMUNITY): Payer: Self-pay | Admitting: Endocrinology

## 2014-01-12 DIAGNOSIS — C73 Malignant neoplasm of thyroid gland: Secondary | ICD-10-CM

## 2014-01-16 ENCOUNTER — Encounter (HOSPITAL_COMMUNITY)
Admission: RE | Admit: 2014-01-16 | Discharge: 2014-01-16 | Disposition: A | Discharge status: Home / Self Care | Payer: 59 | Source: Ambulatory Visit | Attending: Endocrinology | Admitting: Endocrinology

## 2014-01-16 DIAGNOSIS — C73 Malignant neoplasm of thyroid gland: Secondary | ICD-10-CM | POA: Insufficient documentation

## 2014-01-16 MED ORDER — THYROTROPIN ALFA 1.1 MG IM SOLR
0.9000 mg | INTRAMUSCULAR | Status: AC
  Administered 2014-01-16: 0.9 mg via INTRAMUSCULAR

## 2014-01-17 ENCOUNTER — Encounter (HOSPITAL_COMMUNITY)
Admission: RE | Admit: 2014-01-17 | Discharge: 2014-01-17 | Disposition: A | Discharge status: Home / Self Care | Payer: 59 | Source: Ambulatory Visit | Attending: Endocrinology | Admitting: Endocrinology

## 2014-01-17 MED ORDER — THYROTROPIN ALFA 1.1 MG IM SOLR
0.9000 mg | INTRAMUSCULAR | Status: AC
  Administered 2014-01-17: 0.9 mg via INTRAMUSCULAR

## 2014-01-18 ENCOUNTER — Encounter (HOSPITAL_COMMUNITY)
Admission: RE | Admit: 2014-01-18 | Discharge: 2014-01-18 | Disposition: A | Discharge status: Home / Self Care | Payer: 59 | Source: Ambulatory Visit | Attending: Endocrinology | Admitting: Endocrinology

## 2014-01-18 LAB — HCG, SERUM, QUALITATIVE: PREG SERUM: NEGATIVE

## 2014-01-20 ENCOUNTER — Encounter (HOSPITAL_COMMUNITY)
Admission: RE | Admit: 2014-01-20 | Discharge: 2014-01-20 | Disposition: A | Discharge status: Home / Self Care | Payer: 59 | Source: Ambulatory Visit | Attending: Endocrinology | Admitting: Endocrinology

## 2014-01-20 MED ORDER — SODIUM IODIDE I 131 CAPSULE: 4.0000 | Freq: Once | INTRAVENOUS | Status: AC | PRN

## 2014-01-29 IMAGING — US US THYROID BIOPSY
1 series · 11 of 11 positions shown · non-contrast
Comparison: 07/16/2012

CLINICAL DATA: Right thyroid nodule.

ULTRASOUND GUIDED NEEDLE ASPIRATE BIOPSY OF THE THYROID GLAND

[Series 1: us thyroid biopsy · 0.08mm/px · 11 acquisitions, 11 frames shown]
[im 1/11]
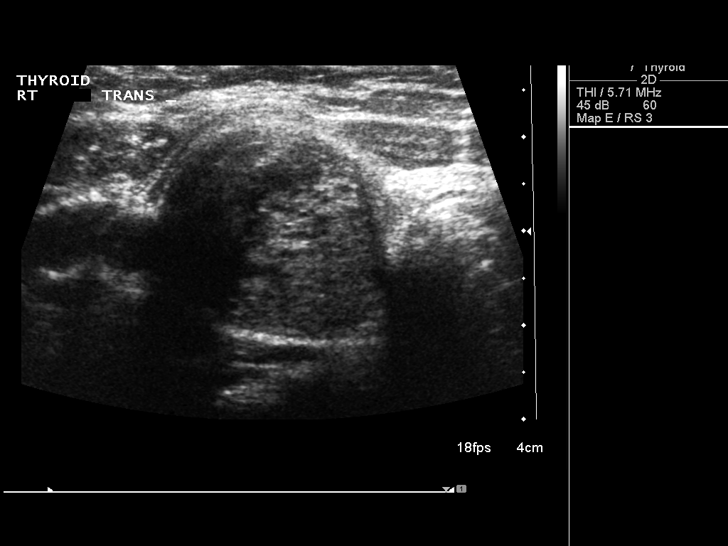
[im 2/11]
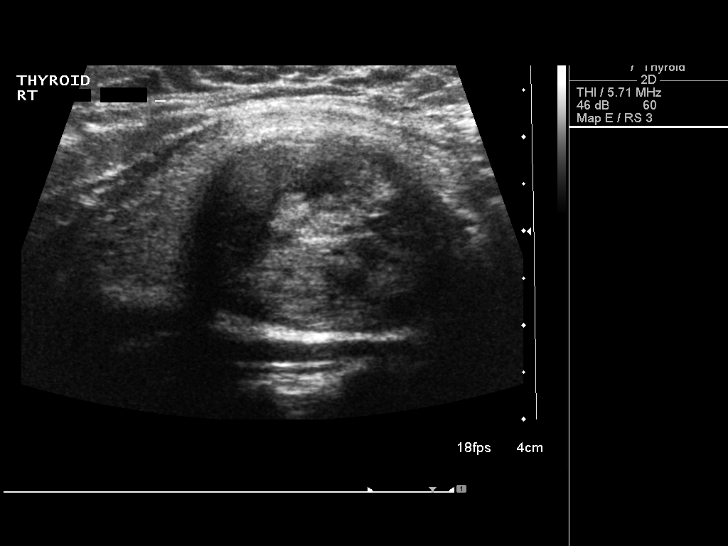
[im 3/11]
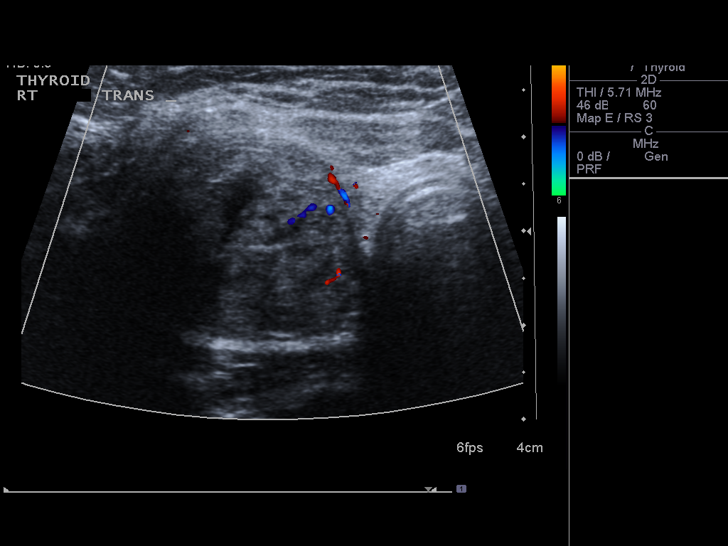
[im 4/11]
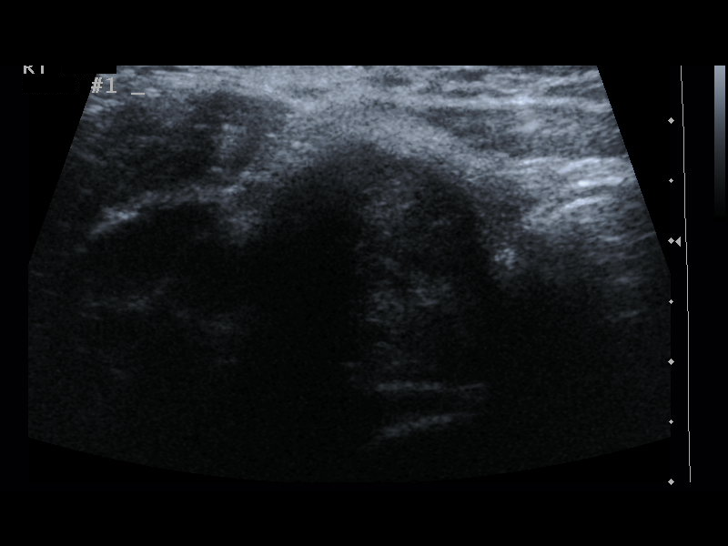
[im 5/11]
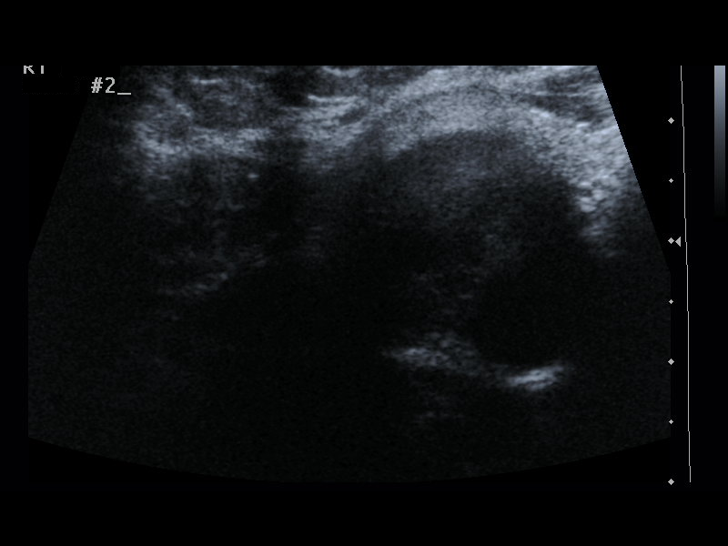
[im 6/11]
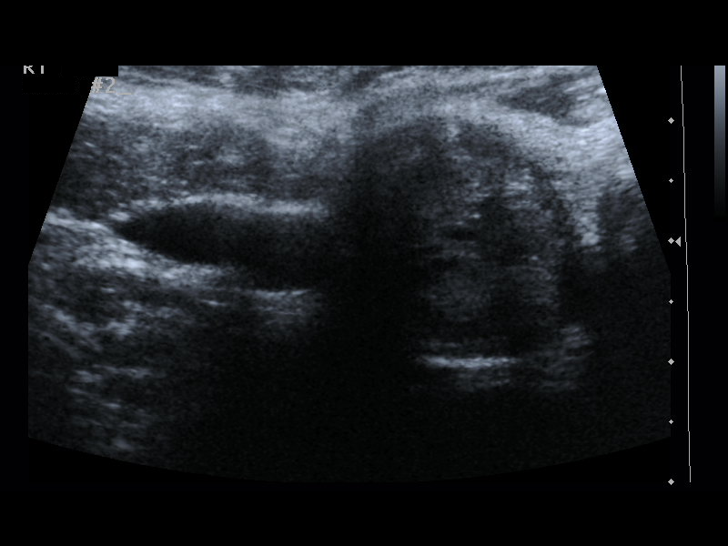
[im 7/11]
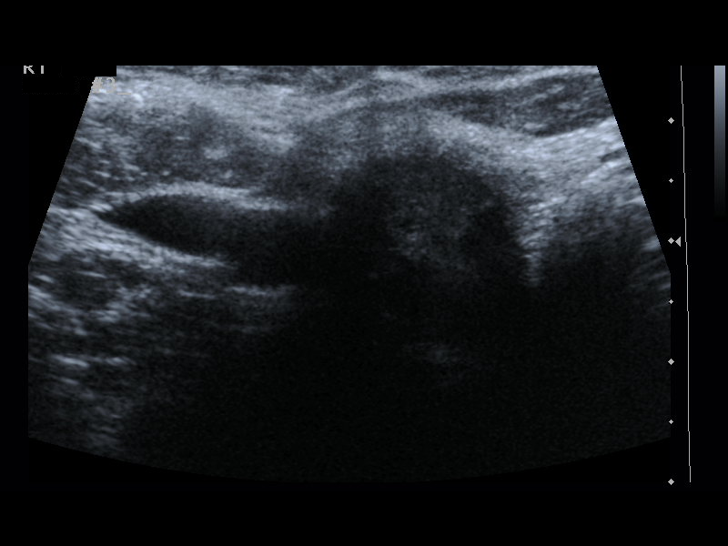
[im 8/11]
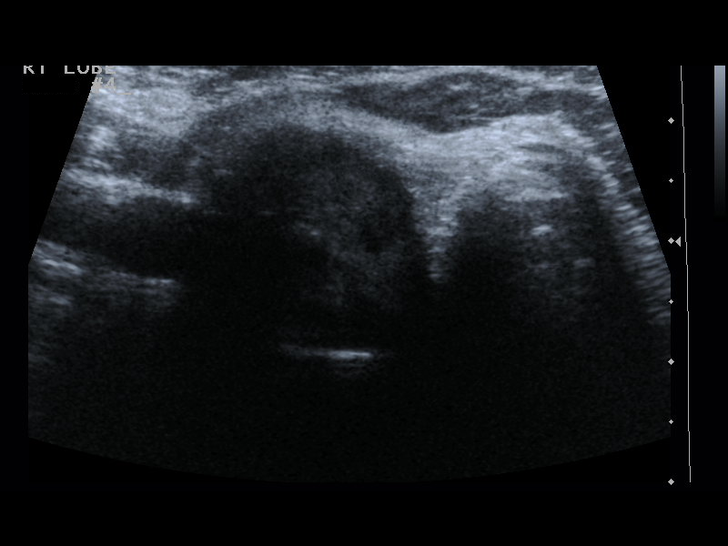
[im 9/11]
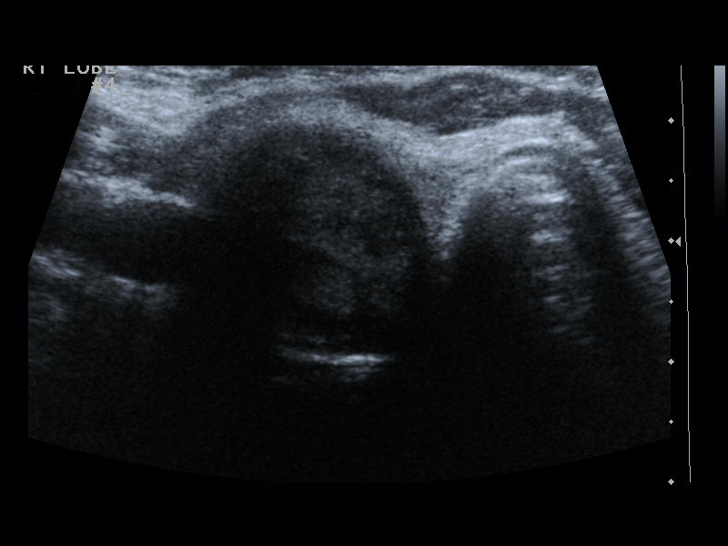
[im 10/11]
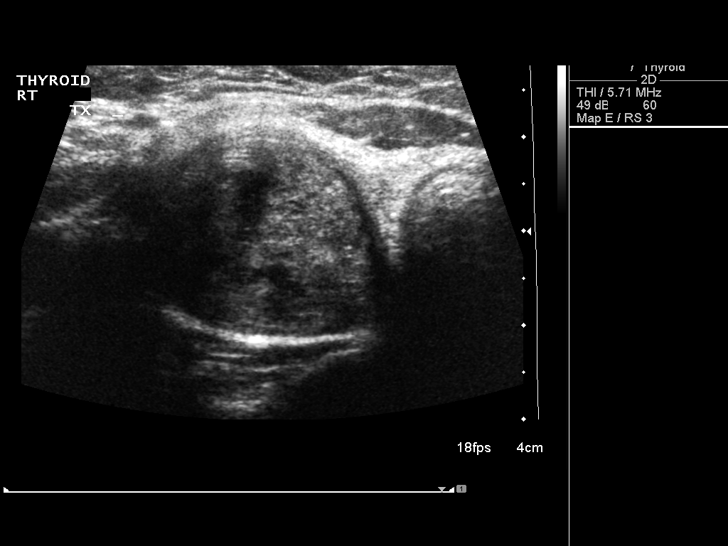
[im 11/11]
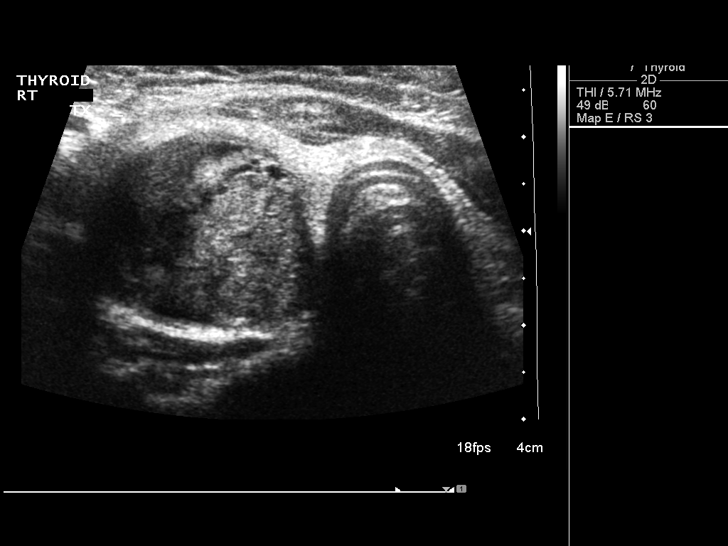

[11 of 11 positions shown; findings below may reference images not displayed]

Thyroid biopsy was thoroughly discussed with the patient and
questions were answered.  The benefits, risks, alternatives, and
complications were also discussed.  The patient understands and
wishes to proceed with the procedure.  Written consent was
obtained.

Ultrasound was performed to localize and mark an adequate site for
the biopsy.  The patient was then prepped and draped in a normal
sterile fashion.  Local anesthesia was provided with 1% lidocaine.
Using direct ultrasound guidance, four passes were made using 25
gauge needles into the nodule within the right lobe of the thyroid.
Ultrasound was used to confirm needle placements on all occasions.
Specimens were sent to Pathology for analysis.

Complications:  None
FINDINGS: There is a dominant hypoechoic right thyroid nodule
IMPRESSION: Ultrasound guided needle aspirate biopsy performed of the right
thyroid nodule.

## 2014-07-11 ENCOUNTER — Ambulatory Visit (INDEPENDENT_AMBULATORY_CARE_PROVIDER_SITE_OTHER): Payer: Commercial Managed Care - PPO | Admitting: General Surgery

## 2014-11-13 ENCOUNTER — Other Ambulatory Visit: Payer: Self-pay | Admitting: Gynecology

## 2014-11-14 LAB — CYTOLOGY - PAP

## 2015-01-26 ENCOUNTER — Other Ambulatory Visit (HOSPITAL_COMMUNITY): Payer: Self-pay | Admitting: Endocrinology

## 2015-01-26 DIAGNOSIS — C73 Malignant neoplasm of thyroid gland: Secondary | ICD-10-CM

## 2015-02-12 ENCOUNTER — Encounter (HOSPITAL_COMMUNITY)
Admission: RE | Admit: 2015-02-12 | Discharge: 2015-02-12 | Disposition: A | Discharge status: Home / Self Care | Payer: 59 | Source: Ambulatory Visit | Attending: Endocrinology | Admitting: Endocrinology

## 2015-02-12 ENCOUNTER — Inpatient Hospital Stay (HOSPITAL_COMMUNITY): Admission: RE | Admit: 2015-02-12 | Payer: 59 | Source: Ambulatory Visit

## 2015-02-12 DIAGNOSIS — C73 Malignant neoplasm of thyroid gland: Secondary | ICD-10-CM | POA: Diagnosis present

## 2015-02-12 MED ORDER — THYROTROPIN ALFA 1.1 MG IM SOLR
0.9000 mg | INTRAMUSCULAR | Status: AC
  Administered 2015-02-12: 0.9 mg via INTRAMUSCULAR

## 2015-02-13 ENCOUNTER — Encounter (HOSPITAL_COMMUNITY)
Admission: RE | Admit: 2015-02-13 | Discharge: 2015-02-13 | Disposition: A | Discharge status: Home / Self Care | Payer: 59 | Source: Ambulatory Visit | Attending: Endocrinology | Admitting: Endocrinology

## 2015-02-13 ENCOUNTER — Ambulatory Visit (HOSPITAL_COMMUNITY): Payer: 59

## 2015-02-13 DIAGNOSIS — C73 Malignant neoplasm of thyroid gland: Secondary | ICD-10-CM | POA: Insufficient documentation

## 2015-02-13 MED ORDER — THYROTROPIN ALFA 1.1 MG IM SOLR
0.9000 mg | INTRAMUSCULAR | Status: AC
  Administered 2015-02-13: 0.9 mg via INTRAMUSCULAR

## 2015-02-14 ENCOUNTER — Ambulatory Visit (HOSPITAL_COMMUNITY): Payer: 59

## 2015-02-14 ENCOUNTER — Encounter (HOSPITAL_COMMUNITY)
Admission: RE | Admit: 2015-02-14 | Discharge: 2015-02-14 | Disposition: A | Discharge status: Home / Self Care | Payer: 59 | Source: Ambulatory Visit | Attending: Endocrinology | Admitting: Endocrinology

## 2015-02-14 DIAGNOSIS — C73 Malignant neoplasm of thyroid gland: Secondary | ICD-10-CM | POA: Insufficient documentation

## 2015-02-14 LAB — HCG, SERUM, QUALITATIVE: Preg, Serum: NEGATIVE

## 2015-02-14 MED ORDER — SODIUM IODIDE I 131 CAPSULE
4.0000 | Freq: Once | INTRAVENOUS | Status: AC | PRN
  Administered 2015-02-14: 4 via ORAL

## 2015-02-16 ENCOUNTER — Encounter (HOSPITAL_COMMUNITY): Payer: 59

## 2015-02-16 ENCOUNTER — Encounter (HOSPITAL_COMMUNITY)
Admission: RE | Admit: 2015-02-16 | Discharge: 2015-02-16 | Disposition: A | Discharge status: Home / Self Care | Payer: 59 | Source: Ambulatory Visit | Attending: Endocrinology | Admitting: Endocrinology

## 2015-02-16 DIAGNOSIS — C73 Malignant neoplasm of thyroid gland: Secondary | ICD-10-CM | POA: Diagnosis not present

## 2015-02-17 LAB — THYROGLOBULIN ANTIBODY

## 2015-02-22 LAB — THYROGLOBULIN LEVEL: Thyroglobulin: <2 ng/mL

## 2015-10-23 MED FILL — LEVOTHYROXINE 125 MCG TAB: 125 | 90 days supply | Qty: 90 | Fill #1

## 2015-12-03 DIAGNOSIS — D509 Iron deficiency anemia, unspecified: Secondary | ICD-10-CM | POA: Diagnosis not present

## 2015-12-03 DIAGNOSIS — E890 Postprocedural hypothyroidism: Secondary | ICD-10-CM | POA: Diagnosis not present

## 2015-12-03 DIAGNOSIS — Z6841 Body Mass Index (BMI) 40.0 and over, adult: Secondary | ICD-10-CM | POA: Diagnosis not present

## 2015-12-03 DIAGNOSIS — C73 Malignant neoplasm of thyroid gland: Secondary | ICD-10-CM | POA: Diagnosis not present

## 2016-01-08 DIAGNOSIS — Z01419 Encounter for gynecological examination (general) (routine) without abnormal findings: Secondary | ICD-10-CM | POA: Diagnosis not present

## 2016-01-08 DIAGNOSIS — Z6841 Body Mass Index (BMI) 40.0 and over, adult: Secondary | ICD-10-CM | POA: Diagnosis not present

## 2016-01-15 MED FILL — LEVOTHYROXINE 125 MCG TAB: 125 | 90 days supply | Qty: 90 | Fill #2

## 2016-03-27 MED FILL — clonazePAM 0.5 MG TABS: 0.5 | 90 days supply | Qty: 90 | Fill #0

## 2016-04-03 MED FILL — LEVOTHYROXINE 125 MCG TAB: 125 | 90 days supply | Qty: 90 | Fill #0

## 2016-05-08 MED FILL — HYDROCORT-PRAMOXINE 2.5-1%: 2.5-1 | 10 days supply | Qty: 30 | Fill #0

## 2016-05-22 DIAGNOSIS — R748 Abnormal levels of other serum enzymes: Secondary | ICD-10-CM | POA: Diagnosis not present

## 2016-05-22 DIAGNOSIS — K219 Gastro-esophageal reflux disease without esophagitis: Secondary | ICD-10-CM | POA: Diagnosis not present

## 2016-05-22 DIAGNOSIS — R1032 Left lower quadrant pain: Secondary | ICD-10-CM | POA: Diagnosis not present

## 2016-05-22 DIAGNOSIS — R194 Change in bowel habit: Secondary | ICD-10-CM | POA: Diagnosis not present

## 2016-05-22 DIAGNOSIS — K601 Chronic anal fissure: Secondary | ICD-10-CM | POA: Diagnosis not present

## 2016-05-22 DIAGNOSIS — R140 Abdominal distension (gaseous): Secondary | ICD-10-CM | POA: Diagnosis not present

## 2016-06-13 DIAGNOSIS — E890 Postprocedural hypothyroidism: Secondary | ICD-10-CM | POA: Diagnosis not present

## 2016-06-13 DIAGNOSIS — Z0000 Encounter for general adult medical examination without abnormal findings: Secondary | ICD-10-CM | POA: Diagnosis not present

## 2016-06-19 DIAGNOSIS — R748 Abnormal levels of other serum enzymes: Secondary | ICD-10-CM | POA: Diagnosis not present

## 2016-06-19 DIAGNOSIS — R194 Change in bowel habit: Secondary | ICD-10-CM | POA: Diagnosis not present

## 2016-06-20 DIAGNOSIS — Z793 Long term (current) use of hormonal contraceptives: Secondary | ICD-10-CM | POA: Diagnosis not present

## 2016-06-20 DIAGNOSIS — Z0000 Encounter for general adult medical examination without abnormal findings: Secondary | ICD-10-CM | POA: Diagnosis not present

## 2016-06-20 DIAGNOSIS — C73 Malignant neoplasm of thyroid gland: Secondary | ICD-10-CM | POA: Diagnosis not present

## 2016-06-20 DIAGNOSIS — F419 Anxiety disorder, unspecified: Secondary | ICD-10-CM | POA: Diagnosis not present

## 2016-06-20 DIAGNOSIS — Z6841 Body Mass Index (BMI) 40.0 and over, adult: Secondary | ICD-10-CM | POA: Diagnosis not present

## 2016-06-20 DIAGNOSIS — E890 Postprocedural hypothyroidism: Secondary | ICD-10-CM | POA: Diagnosis not present

## 2016-06-20 DIAGNOSIS — E0781 Sick-euthyroid syndrome: Secondary | ICD-10-CM | POA: Diagnosis not present

## 2016-06-20 DIAGNOSIS — H6983 Other specified disorders of Eustachian tube, bilateral: Secondary | ICD-10-CM | POA: Diagnosis not present

## 2016-06-30 MED FILL — LEVOTHYROXINE 125 MCG TAB: 125 | 90 days supply | Qty: 90 | Fill #1

## 2016-09-19 MED FILL — LEVOTHYROXINE 125 MCG TABLE: 125 | 90 days supply | Qty: 90 | Fill #2

## 2016-11-12 ENCOUNTER — Encounter: Payer: Self-pay | Admitting: Family Medicine

## 2016-11-12 ENCOUNTER — Ambulatory Visit (INDEPENDENT_AMBULATORY_CARE_PROVIDER_SITE_OTHER): Payer: 59 | Admitting: Family Medicine

## 2016-11-12 DIAGNOSIS — M25572 Pain in left ankle and joints of left foot: Secondary | ICD-10-CM | POA: Diagnosis not present

## 2016-11-12 NOTE — Assessment & Plan Note (Signed)
Most likely she has a component of ankle instability associated with hypermobility syndrome. Beighton score of roughly 7-9 out of 9. She also has a brother that reports being hypermobile. - Provided home exercises today. - Encouraged to wear compression - Encouraged to wear compression sleeve on that ankle - Advised to not go beyond normal range of motion performing certain exercises - She'll follow-up for 4 weeks. May need to consider formal physical therapy if no improvement.

## 2016-11-12 NOTE — Progress Notes (Signed)
  Lisa Bolton - 31 y.o. female MRN [id number]  Date of birth: [birth year]  SUBJECTIVE:  Including CC & ROS.   Lisa Bolton is a 31 year old female that is presenting with left lateral ankle pain. This pain is been present for about a week. The pain had no inciting event. She feels like the pain is worse with inversion. She has a history of a broken foot and ankle as well as an anterior cruciate ligament tear on that same leg. She denies any numbness or tingling. She does report some swelling. There is no redness or sweating.  ROS: No unexpected weight loss, fever, chills, swelling, numbness/tingling, redness, otherwise see HPI    HISTORY: Past Medical, Surgical, Social, and Family History Reviewed & Updated per EMR.   Pertinent Historical Findings include: PMSHx -  ACL tear in left knee,  PSHx -  Works at [employer]: None to review   PHYSICAL EXAM:  VS: BP:132/77  HR: bpm  TEMP: ( )  RESP:   HT:5' 3.5" (161.3 cm)   WT:280 lb (127 kg)  BMI:48.9 PHYSICAL EXAM: Gen: NAD, alert, cooperative with exam, well-appearing HEENT: clear conjunctiva, EOMI CV:  no edema, capillary refill brisk,  Resp: non-labored, normal speech Skin: no rashes, normal turgor  Neuro: no gross deficits.  Psych:  alert and oriented Ankle:  No obvious effusion. Some splaying of the first and second digit on either foot. Some flattening of the longitudinal and transverse arch upon weightbearing. No significant tenderness to palpation over the lateral malleolus No tenderness to palpation over the peroneal tendons. No tenderness to palpation over the base of the fifth metatarsal Some imbalance with 1 foot testing. Able to bend both thumbs back to her forearm. Able to take each finger past 90. Able to place her hands flat on the floor. Able to bend her knees and elbows backwards Has some laxity with anterior drawer Neurovascular intact  ASSESSMENT & PLAN:   Left ankle pain Most  likely she has a component of ankle instability associated with hypermobility syndrome. Beighton score of roughly 7-9 out of 9. She also has a brother that reports being hypermobile. - Provided home exercises today. - Encouraged to wear compression - Encouraged to wear compression sleeve on that ankle - Advised to not go beyond normal range of motion performing certain exercises - She'll follow-up for 4 weeks. May need to consider formal physical therapy if no improvement.

## 2016-12-09 DIAGNOSIS — Z6841 Body Mass Index (BMI) 40.0 and over, adult: Secondary | ICD-10-CM | POA: Diagnosis not present

## 2016-12-09 DIAGNOSIS — D509 Iron deficiency anemia, unspecified: Secondary | ICD-10-CM | POA: Diagnosis not present

## 2016-12-09 DIAGNOSIS — M25572 Pain in left ankle and joints of left foot: Secondary | ICD-10-CM | POA: Diagnosis not present

## 2016-12-09 DIAGNOSIS — C73 Malignant neoplasm of thyroid gland: Secondary | ICD-10-CM | POA: Diagnosis not present

## 2016-12-09 DIAGNOSIS — E890 Postprocedural hypothyroidism: Secondary | ICD-10-CM | POA: Diagnosis not present

## 2016-12-09 MED FILL — LEVOTHYROXINE 125 MCG TABLE: 125 | 84 days supply | Qty: 90 | Fill #0

## 2016-12-24 MED FILL — clonazePAM 0.5 MG TABS: 0.5 | 90 days supply | Qty: 90 | Fill #0

## 2017-01-09 DIAGNOSIS — M25572 Pain in left ankle and joints of left foot: Secondary | ICD-10-CM | POA: Diagnosis not present

## 2017-01-09 DIAGNOSIS — Z6841 Body Mass Index (BMI) 40.0 and over, adult: Secondary | ICD-10-CM | POA: Diagnosis not present

## 2017-02-12 DIAGNOSIS — Z6841 Body Mass Index (BMI) 40.0 and over, adult: Secondary | ICD-10-CM | POA: Diagnosis not present

## 2017-02-12 DIAGNOSIS — Z01419 Encounter for gynecological examination (general) (routine) without abnormal findings: Secondary | ICD-10-CM | POA: Diagnosis not present

## 2017-03-03 MED FILL — LEVOTHYROXINE 125 MCG TABLE: 125 | 84 days supply | Qty: 90 | Fill #1

## 2017-03-17 ENCOUNTER — Other Ambulatory Visit: Payer: Self-pay | Admitting: Nurse Practitioner

## 2017-03-17 DIAGNOSIS — R234 Changes in skin texture: Secondary | ICD-10-CM

## 2017-03-17 DIAGNOSIS — N644 Mastodynia: Secondary | ICD-10-CM

## 2017-03-17 DIAGNOSIS — N649 Disorder of breast, unspecified: Secondary | ICD-10-CM | POA: Diagnosis not present

## 2017-03-19 ENCOUNTER — Ambulatory Visit
Admission: RE | Admit: 2017-03-19 | Discharge: 2017-03-19 | Disposition: A | Discharge status: Home / Self Care | Payer: 59 | Source: Ambulatory Visit | Attending: Nurse Practitioner | Admitting: Nurse Practitioner

## 2017-03-19 DIAGNOSIS — R928 Other abnormal and inconclusive findings on diagnostic imaging of breast: Secondary | ICD-10-CM | POA: Diagnosis not present

## 2017-03-19 DIAGNOSIS — R234 Changes in skin texture: Secondary | ICD-10-CM

## 2017-03-19 DIAGNOSIS — N644 Mastodynia: Secondary | ICD-10-CM

## 2017-03-19 DIAGNOSIS — N63 Unspecified lump in unspecified breast: Secondary | ICD-10-CM | POA: Diagnosis not present

## 2017-04-24 DIAGNOSIS — L738 Other specified follicular disorders: Secondary | ICD-10-CM | POA: Diagnosis not present

## 2017-04-24 DIAGNOSIS — D225 Melanocytic nevi of trunk: Secondary | ICD-10-CM | POA: Diagnosis not present

## 2017-04-24 DIAGNOSIS — L812 Freckles: Secondary | ICD-10-CM | POA: Diagnosis not present

## 2017-04-24 DIAGNOSIS — L718 Other rosacea: Secondary | ICD-10-CM | POA: Diagnosis not present

## 2017-04-24 DIAGNOSIS — L02422 Furuncle of left axilla: Secondary | ICD-10-CM | POA: Diagnosis not present

## 2017-04-24 DIAGNOSIS — D2272 Melanocytic nevi of left lower limb, including hip: Secondary | ICD-10-CM | POA: Diagnosis not present

## 2017-04-24 DIAGNOSIS — L858 Other specified epidermal thickening: Secondary | ICD-10-CM | POA: Diagnosis not present

## 2017-04-24 DIAGNOSIS — L905 Scar conditions and fibrosis of skin: Secondary | ICD-10-CM | POA: Diagnosis not present

## 2017-05-28 MED FILL — LEVOTHYROXINE 125 MCG TABLE: 125 | 84 days supply | Qty: 90 | Fill #2

## 2017-06-01 MED FILL — CLINDAMYCIN PH 1% GEL: 1 | 14 days supply | Qty: 60 | Fill #0

## 2017-06-15 DIAGNOSIS — E890 Postprocedural hypothyroidism: Secondary | ICD-10-CM | POA: Diagnosis not present

## 2017-06-15 DIAGNOSIS — Z0000 Encounter for general adult medical examination without abnormal findings: Secondary | ICD-10-CM | POA: Diagnosis not present

## 2017-06-22 DIAGNOSIS — F418 Other specified anxiety disorders: Secondary | ICD-10-CM | POA: Diagnosis not present

## 2017-06-22 DIAGNOSIS — Z0000 Encounter for general adult medical examination without abnormal findings: Secondary | ICD-10-CM | POA: Diagnosis not present

## 2017-06-22 DIAGNOSIS — E0781 Sick-euthyroid syndrome: Secondary | ICD-10-CM | POA: Diagnosis not present

## 2017-06-22 DIAGNOSIS — Z1389 Encounter for screening for other disorder: Secondary | ICD-10-CM | POA: Diagnosis not present

## 2017-06-22 DIAGNOSIS — E890 Postprocedural hypothyroidism: Secondary | ICD-10-CM | POA: Diagnosis not present

## 2017-06-22 DIAGNOSIS — C73 Malignant neoplasm of thyroid gland: Secondary | ICD-10-CM | POA: Diagnosis not present

## 2017-06-22 DIAGNOSIS — D508 Other iron deficiency anemias: Secondary | ICD-10-CM | POA: Diagnosis not present

## 2017-06-22 DIAGNOSIS — R12 Heartburn: Secondary | ICD-10-CM | POA: Diagnosis not present

## 2017-07-16 ENCOUNTER — Encounter: Payer: Self-pay | Admitting: Allergy

## 2017-07-16 ENCOUNTER — Other Ambulatory Visit: Payer: Self-pay | Admitting: *Deleted

## 2017-07-16 ENCOUNTER — Ambulatory Visit (INDEPENDENT_AMBULATORY_CARE_PROVIDER_SITE_OTHER): Payer: 59 | Admitting: Allergy

## 2017-07-16 VITALS — BP 116/72 | HR 80 | Temp 97.9°F | Resp 16 | Ht 65.5 in | Wt 278.6 lb

## 2017-07-16 DIAGNOSIS — T7800XA Anaphylactic reaction due to unspecified food, initial encounter: Secondary | ICD-10-CM | POA: Diagnosis not present

## 2017-07-16 DIAGNOSIS — T50Z95A Adverse effect of other vaccines and biological substances, initial encounter: Secondary | ICD-10-CM | POA: Diagnosis not present

## 2017-07-16 MED ORDER — EPINEPHRINE 0.3 MG/0.3ML IJ SOAJ: INTRAMUSCULAR | 1 refills | Status: DC

## 2017-07-16 NOTE — Progress Notes (Signed)
[place] [address] Dept: [phone]  FAMILY NURSE PRACTITIONER NEW PATIENT NOTE  Patient ID: Lisa Bolton, female    DOB: [birth year]  Age: 31 y.o. MRN: [id number] Date of Office Visit: 07/16/2017 Referring provider: Haywood Pao, MD [address] [place], [place] [address]    Chief Complaint: Food Intolerance (? shrimp allergy; reaction to flu vaccine)  HPI Lisa Bolton presents for possible food allergies. About 2 years ago she was eating shrimp in a small seafood restaurant when she experienced throat scratching, roof of her mouth itching, facial flushing, and hives that began on her arms, neck and upper chest and were itchy. She did not experience any gastrointestinal distress, respiratory of CV related symptoms at that time. Since that time she has been avoiding all seafood. She reports having the influenza immunization 2-3 weeks ago and within an hour broke out in a "splotchy rash" on her arms and also had itching on the roof of her mouth. She has previously tolerated flu vaccine without any symptoms.  She reports, as a child, she had an unknown reaction to a pertussis vaccine. She had been eating a lot of eggs prior to the flu shot this year without any adverse reaction but is interested in testing for an egg allergy today.  She has been avoiding egg since this reaction to vaccine.  She reports that peanut butter makes her tongue tingly and would like to add this to testing today as well.   Review of Systems  Constitutional: Negative.   HENT:       History of mouth itching associated with allergic reaction that has resolved  Eyes: Negative.   Respiratory: Negative.   Cardiovascular: Negative.   Gastrointestinal: Negative.   Genitourinary: Negative.   Musculoskeletal: Negative.   Skin:       History of hives associated with allergic reaction that have resolved  Neurological: Negative.   Endo/Heme/Allergies: Negative.     Psychiatric/Behavioral:       History of anxiety    Outpatient Encounter Prescriptions as of 07/16/2017  Medication Sig  . clonazePAM (KLONOPIN) 0.5 MG tablet Take 0.5 mg by mouth 2 (two) times daily as needed. For anxiety  . levothyroxine (SYNTHROID, LEVOTHROID) 125 MCG tablet Take 125 mcg by mouth daily. Pt taking 3/4 tab  . Probiotic Product (PROBIOTIC-10) CAPS Take by mouth.  . clindamycin (CLINDAGEL) 1 % gel APPLY 1 APPLICATION TOPICALLY TO THE AFFECTED AREA TWICE DAILY AS NEEDED  . EPINEPHrine 0.3 mg/0.3 mL IJ SOAJ injection Use as directed for severe allergic reaction  . [DISCONTINUED] escitalopram (LEXAPRO) 10 MG tablet Take 10 mg by mouth daily.   No facility-administered encounter medications on file as of 07/16/2017.      Drug Allergies:  Allergies  Allergen Reactions  . Other Other (See Comments)    Anethesia    Family History: Lisa Bolton's family history includes Anxiety disorder in her brother; Arthritis in her maternal grandmother; Heart attack in her mother and paternal grandfather; Heart disease in her paternal grandmother; Hypertension in her mother; Muscular dystrophy in her mother; Parkinson's disease in her maternal grandmother; Thyroid cancer in her mother.   Environmental History: Lisa Bolton lives in a 32 year old house with no concern for water damage or mildew. The flooring is hard wood and carpet.   Physical Exam: BP 116/72 (BP Location: Left Arm, Patient Position: Sitting, Cuff Size: Normal)   Pulse 80   Temp 97.9 F (36.6 C) (Oral)   Resp 16  Ht 5' 5.5" (1.664 m)   Wt 278 lb 9.6 oz (126.4 kg)   BMI 45.66 kg/m    Physical Exam  Constitutional: She is oriented to person, place, and time. She appears well-developed and well-nourished.  HENT:  Ears normal, nose normal, pharynx normal.  Eyes:  Eyes normal  Neck: Normal range of motion. Neck supple.  Cardiovascular:  S1S2. Regular rate and rhythm  Pulmonary/Chest:  Lungs clear to auscultation   Abdominal: Soft. Bowel sounds are normal.  Musculoskeletal: Normal range of motion.  Neurological: She is alert and oriented to person, place, and time.  Skin: Skin is warm and dry.  Psychiatric: She has a normal mood and affect. Her behavior is normal.    Diagnostics: Percutaneous skin testing positive to lobster with a small histamine response. Negative responses include peanut, egg white, shellfish mix, fish mix, cashew, flounder, trout, shrimp, crab, tuna, pecan, walnut, oyster, scallops, salmon, almond, hazelnut, and [place] nut.    Assessment and Plan: 1. Anaphylactic shock due to food, initial encounter   2. Vaccine reaction  Meds ordered this encounter  Medications  . EPINEPHrine 0.3 mg/0.3 mL IJ SOAJ injection    Sig: Use as directed for severe allergic reaction    Dispense:  2 Device    Refill:  1    Patient Instructions  Food Allergy - Avoidence of Lobster and likely all shellfish pending labs.  - Continue avoidence of seafood, peanuts, tree nuts, and eggs until blood work results are known.  - Carry epinephrine device for life threatening allergic reaction  Vaccine reaction - reaction involving cutaneous symptoms occurred about an hour after flu vaccine administration. Given reaction is not severe recommend she pretreat prior to flu vaccine administration with long-acting anithistamine like Zyrtec, Allegra or Xyzal.  She can also perform split dosing.    - egg testing is negative further this would not preclude from obtaining flu vaccine in future.    Follow up with lab results. Then follow up in 6 months   Return in about 6 months (around 01/14/2018), or follow up with labs.   Thank you for the opportunity to care for this patient.  Please do not hesitate to contact me with questions.  Lisa Morgan, FNP [facility] [place]: I performed/dicussed a history and physical examination of the patient and discussed management with NP  Lisa Bolton. I reviewed the NP's note and agree with the documented findings and plan of care. The note in its entirety was edited as needed by myself, including the physical exam, assessment, and plan.   Lisa Feeler, MD [facility]

## 2017-07-16 NOTE — Patient Instructions (Signed)
Food Allergy - Avoidence of Lobster - Continue avoidence of sea foods, peanuts, tree nuts, and eggs until blood work results are known - Carry epinephrine device for life threatening allergic reaction  Follow up with lab results. Then follow up in 6 months

## 2017-07-19 LAB — ALLERGEN PROFILE, FOOD-FISH
Allergen Mackerel IgE: <0.1 kU/L
Allergen Trout IgE: <0.1 kU/L
Halibut IgE: <0.1 kU/L

## 2017-07-19 LAB — ALLERGENS(7)
Brazil Nut IgE: <0.1 kU/L
Hazelnut (Filbert) IgE: <0.1 kU/L
Pecan Nut IgE: <0.1 kU/L
Walnut IgE: <0.1 kU/L

## 2017-07-19 LAB — ALLERGEN PROFILE, SHELLFISH
F023-IgE Crab: 3.29 kU/L — AB
F080-IgE Lobster: 0.11 kU/L — AB
F290-IgE Oyster: <0.1 kU/L
SCALLOP IGE: 0.26 kU/L — AB
SHRIMP IGE: 7.35 kU/L — AB

## 2017-07-19 LAB — ALLERGEN EGG WHITE F1

## 2017-07-20 MED FILL — EPINEPHRINE 0.3 MG AUTO-INJ: 0.3 | 30 days supply | Qty: 2 | Fill #0

## 2017-07-27 NOTE — Progress Notes (Signed)
Patient was notified of current allergies on 10/502018

## 2017-08-21 MED FILL — LEVOTHYROXINE 125 MCG TABLE: 125 | 90 days supply | Qty: 100 | Fill #0

## 2017-10-07 DIAGNOSIS — F418 Other specified anxiety disorders: Secondary | ICD-10-CM | POA: Diagnosis not present

## 2017-10-07 DIAGNOSIS — Z6841 Body Mass Index (BMI) 40.0 and over, adult: Secondary | ICD-10-CM | POA: Diagnosis not present

## 2017-10-07 DIAGNOSIS — R21 Rash and other nonspecific skin eruption: Secondary | ICD-10-CM | POA: Diagnosis not present

## 2017-10-07 MED FILL — clonazePAM 0.5 MG TABS: 0.5 | 30 days supply | Qty: 60 | Fill #0

## 2017-11-24 MED FILL — LEVOTHYROXINE 125 MCG TAB: 125 | 90 days supply | Qty: 100 | Fill #1

## 2017-12-21 DIAGNOSIS — Z6841 Body Mass Index (BMI) 40.0 and over, adult: Secondary | ICD-10-CM | POA: Diagnosis not present

## 2017-12-21 DIAGNOSIS — Z1389 Encounter for screening for other disorder: Secondary | ICD-10-CM | POA: Diagnosis not present

## 2017-12-21 DIAGNOSIS — D508 Other iron deficiency anemias: Secondary | ICD-10-CM | POA: Diagnosis not present

## 2017-12-21 DIAGNOSIS — C73 Malignant neoplasm of thyroid gland: Secondary | ICD-10-CM | POA: Diagnosis not present

## 2017-12-21 DIAGNOSIS — E890 Postprocedural hypothyroidism: Secondary | ICD-10-CM | POA: Diagnosis not present

## 2018-02-25 MED FILL — LEVOTHYROXINE 125 MCG TAB: 125 | 90 days supply | Qty: 100 | Fill #2

## 2018-05-31 MED FILL — LEVOTHYROXINE 125 MCG TAB: 125 | 90 days supply | Qty: 100 | Fill #3

## 2018-06-16 ENCOUNTER — Ambulatory Visit (INDEPENDENT_AMBULATORY_CARE_PROVIDER_SITE_OTHER): Payer: 59 | Admitting: Family Medicine

## 2018-06-16 ENCOUNTER — Other Ambulatory Visit (INDEPENDENT_AMBULATORY_CARE_PROVIDER_SITE_OTHER): Payer: 59

## 2018-06-16 ENCOUNTER — Encounter: Payer: Self-pay | Admitting: Family Medicine

## 2018-06-16 VITALS — BP 126/82 | HR 88 | Temp 98.3°F | Ht 65.5 in | Wt 261.0 lb

## 2018-06-16 DIAGNOSIS — D509 Iron deficiency anemia, unspecified: Secondary | ICD-10-CM | POA: Diagnosis not present

## 2018-06-16 DIAGNOSIS — E890 Postprocedural hypothyroidism: Secondary | ICD-10-CM

## 2018-06-16 DIAGNOSIS — R5381 Other malaise: Secondary | ICD-10-CM

## 2018-06-16 DIAGNOSIS — Z13220 Encounter for screening for lipoid disorders: Secondary | ICD-10-CM | POA: Diagnosis not present

## 2018-06-16 DIAGNOSIS — R5383 Other fatigue: Secondary | ICD-10-CM

## 2018-06-16 DIAGNOSIS — R739 Hyperglycemia, unspecified: Secondary | ICD-10-CM | POA: Diagnosis not present

## 2018-06-16 DIAGNOSIS — R21 Rash and other nonspecific skin eruption: Secondary | ICD-10-CM

## 2018-06-16 DIAGNOSIS — R7309 Other abnormal glucose: Secondary | ICD-10-CM

## 2018-06-16 DIAGNOSIS — C73 Malignant neoplasm of thyroid gland: Secondary | ICD-10-CM

## 2018-06-16 DIAGNOSIS — F411 Generalized anxiety disorder: Secondary | ICD-10-CM

## 2018-06-16 DIAGNOSIS — E559 Vitamin D deficiency, unspecified: Secondary | ICD-10-CM

## 2018-06-16 DIAGNOSIS — E88819 Insulin resistance, unspecified: Secondary | ICD-10-CM

## 2018-06-16 DIAGNOSIS — E8881 Metabolic syndrome: Secondary | ICD-10-CM

## 2018-06-16 LAB — VITAMIN B12: Vitamin B-12: 315 pg/mL (ref 211–911)

## 2018-06-16 LAB — COMPREHENSIVE METABOLIC PANEL
ALT: 15 U/L (ref 0–35)
AST: 11 U/L (ref 0–37)
Albumin: 4.1 g/dL (ref 3.5–5.2)
Alkaline Phosphatase: 89 U/L (ref 39–117)
BUN: 11 mg/dL (ref 6–23)
CO2: 27 mEq/L (ref 19–32)
Calcium: 9 mg/dL (ref 8.4–10.5)
Chloride: 106 mEq/L (ref 96–112)
Creatinine, Ser: 0.92 mg/dL (ref 0.40–1.20)
GFR: 74.5 mL/min (ref >60.00)
Glucose, Bld: 106 mg/dL — ABNORMAL HIGH (ref 70–99)
Potassium: 4.2 mEq/L (ref 3.5–5.1)
Sodium: 138 mEq/L (ref 135–145)
Total Bilirubin: 0.4 mg/dL (ref 0.2–1.2)
Total Protein: 7 g/dL (ref 6.0–8.3)

## 2018-06-16 LAB — CBC WITH DIFFERENTIAL/PLATELET
Basophils Absolute: 0 10*3/uL (ref 0.0–0.1)
Basophils Relative: 0.6 % (ref 0.0–3.0)
Eosinophils Absolute: 0.1 10*3/uL (ref 0.0–0.7)
Eosinophils Relative: 1.3 % (ref 0.0–5.0)
HCT: 35.8 % — ABNORMAL LOW (ref 36.0–46.0)
Hemoglobin: 11.6 g/dL — ABNORMAL LOW (ref 12.0–15.0)
Lymphocytes Relative: 23.8 % (ref 12.0–46.0)
Lymphs Abs: 1.8 10*3/uL (ref 0.7–4.0)
MCHC: 32.4 g/dL (ref 30.0–36.0)
MCV: 81.8 fl (ref 78.0–100.0)
Monocytes Absolute: 0.6 10*3/uL (ref 0.1–1.0)
Monocytes Relative: 7.2 % (ref 3.0–12.0)
Neutro Abs: 5.1 10*3/uL (ref 1.4–7.7)
Neutrophils Relative %: 67.1 % (ref 43.0–77.0)
Platelets: 292 10*3/uL (ref 150.0–400.0)
RBC: 4.37 Mil/uL (ref 3.87–5.11)
RDW: 16.4 % — ABNORMAL HIGH (ref 11.5–15.5)
WBC: 7.6 10*3/uL (ref 4.0–10.5)

## 2018-06-16 LAB — LIPID PANEL
Cholesterol: 156 mg/dL (ref 0–200)
HDL: 40.8 mg/dL (ref >39.00)
LDL Cholesterol: 96 mg/dL (ref 0–99)
NonHDL: 114.84
Total CHOL/HDL Ratio: 4
Triglycerides: 92 mg/dL (ref 0.0–149.0)
VLDL: 18.4 mg/dL (ref 0.0–40.0)

## 2018-06-16 LAB — TSH: TSH: 0.08 u[IU]/mL — ABNORMAL LOW (ref 0.35–4.50)

## 2018-06-16 LAB — T4, FREE: Free T4: 1.25 ng/dL (ref 0.60–1.60)

## 2018-06-16 LAB — HEMOGLOBIN A1C: Hgb A1c MFr Bld: 5.7 % (ref 4.6–6.5)

## 2018-06-16 LAB — VITAMIN D 25 HYDROXY (VIT D DEFICIENCY, FRACTURES): VITD: 19.11 ng/mL — ABNORMAL LOW (ref 30.00–100.00)

## 2018-06-16 MED ORDER — ESCITALOPRAM OXALATE 10 MG PO TABS: 10.0000 mg | ORAL_TABLET | Freq: Every day | ORAL | 3 refills | Status: DC

## 2018-06-16 MED ORDER — CLONAZEPAM 0.5 MG PO TABS: 0.5000 mg | ORAL_TABLET | Freq: Two times a day (BID) | ORAL | 1 refills | Status: DC | PRN

## 2018-06-16 MED FILL — ESCITALOPRAM 10 MG TABLET: 10 | 30 days supply | Qty: 30 | Fill #0

## 2018-06-16 NOTE — Progress Notes (Signed)
Lisa Bolton is a 32 y.o. female is here to [place].   Patient Care Team: Briscoe Deutscher, DO as PCP - General (Family Medicine)   History of Present Illness:   [name] acting as scribe for [name].  HPI: Patient comes in today to establish care with [name]. Patient works for [employer].   Anxiety: Patient has been taking Klonopin PRN and would like to go on it daily. PHQ 9 and GAD 7 given to patient today.   Thyroid cancer: Patient has had thyroid cancer about 5 years ago. She is followed by Endocrinology for this. She takes Levothyroxine 125 mcg daily. She takes 1.5 tablets on Sunday.   Depression screen [facility] 2/9 06/16/2018 11/12/2016  Decreased Interest 0 0  Down, Depressed, Hopeless 0 0  PHQ - 2 Score 0 0  Altered sleeping 1 -  Tired, decreased energy 0 -  Change in appetite 0 -  Feeling bad or failure about yourself  0 -  Trouble concentrating 0 -  Moving slowly or fidgety/restless 0 -  Suicidal thoughts 0 -  PHQ-9 Score 1 -  Difficult doing work/chores Not difficult at all -   GAD 7 : Generalized Anxiety Score 06/16/2018  Nervous, Anxious, on Edge 1  Control/stop worrying 1  Worry too much - different things 1  Trouble relaxing 1  Restless 0  Easily annoyed or irritable 0  Afraid - awful might happen 1  Total GAD 7 Score 5  Anxiety Difficulty Somewhat difficult   Health Maintenance Due  Topic Date Due  . HIV Screening  12/21/1999  . TETANUS/TDAP  12/21/2003  . PAP SMEAR  11/13/2017  . INFLUENZA VACCINE  05/06/2018   Depression screen PHQ 2/9 06/16/2018  Decreased Interest 0  Down, Depressed, Hopeless 0  PHQ - 2 Score 0  Altered sleeping 1  Tired, decreased energy 0  Change in appetite 0  Feeling bad or failure about yourself  0  Trouble concentrating 0  Moving slowly or fidgety/restless 0  Suicidal thoughts 0  PHQ-9 Score 1  Difficult doing work/chores Not difficult at all   PMHx, SurgHx, SocialHx, Medications, and Allergies  were reviewed in the Visit Navigator and updated as appropriate.   Past Medical History:  Diagnosis Date  . Anemia   . Chronic vaginitis   . Family history of anesthesia complication    UNCLE WITH  PSUEDOCHOLINESTEROSE  DEFIENCY  . GERD (gastroesophageal reflux disease)   . Malignant neoplasm of thyroid gland ([place]) 10/11/2012  . Panic attacks   . Recurrent boils, vulva      Past Surgical History:  Procedure Laterality Date  . ANTERIOR CRUCIATE LIGAMENT REPAIR Left 2001 - approximate  . LYMPH NODE DISSECTION  10/29/2012   Procedure: LYMPH NODE DISSECTION;  Surgeon: Adin Hector, MD;  Location: [facility];  Service: General;  Laterality: N/A;  central compartment lymph node dissection   . THYROID LOBECTOMY  09/21/2012   Procedure: THYROID LOBECTOMY;  Surgeon: Adin Hector, MD;  Location: [facility];  Service: General;  Laterality: Right;  Right Thyroid Lobectomy  . THYROID LOBECTOMY  10/29/2012   Procedure: THYROID LOBECTOMY;  Surgeon: Adin Hector, MD;  Location: [facility];  Service: General;  Laterality: Left;  left thyroid lobectomy,central compartmental lymph node dissection     Family History  Problem Relation Age of Onset  . Muscular dystrophy Mother   . Hypertension Mother   . Heart attack Mother   . Thyroid cancer Mother   .  Anxiety disorder Brother   . Arthritis Maternal Grandmother   . Parkinson's disease Maternal Grandmother   . Heart disease Paternal Grandmother   . Heart attack Paternal Grandfather   . Allergic rhinitis Neg Hx   . Angioedema Neg Hx   . Asthma Neg Hx   . Eczema Neg Hx     Social History   Tobacco Use  . Smoking status: Never Smoker  . Smokeless tobacco: Never Used  Substance Use Topics  . Alcohol use: Yes    Alcohol/week: 4.0 standard drinks    Types: 2 Glasses of wine, 2 Cans of beer per week    Comment: socially  . Drug use: No    Current Medications and Allergies:   Current Outpatient Medications:  .  clonazePAM (KLONOPIN) 0.5 MG  tablet, Take 1 tablet (0.5 mg total) by mouth 2 (two) times daily as needed. For anxiety, Disp: 30 tablet, Rfl: 1 .  EPINEPHrine 0.3 mg/0.3 mL IJ SOAJ injection, Use as directed for severe allergic reaction, Disp: 2 Device, Rfl: 1 .  levothyroxine (SYNTHROID, LEVOTHROID) 125 MCG tablet, Take 125 mcg by mouth daily before breakfast., Disp: , Rfl:  .  Probiotic Product (PROBIOTIC-10) CAPS, Take by mouth., Disp: , Rfl:  .  escitalopram (LEXAPRO) 10 MG tablet, Take 1 tablet (10 mg total) by mouth daily., Disp: 30 tablet, Rfl: 3   Allergies  Allergen Reactions  . Shellfish Allergy   . Other Other (See Comments)    Anethesia   Review of Systems:   Pertinent items are noted in the HPI. Otherwise, ROS is negative.  Vitals:   Vitals:   06/16/18 1404  BP: 126/82  Pulse: 88  Temp: 98.3 F (36.8 C)  TempSrc: Oral  SpO2: 97%  Weight: 261 lb (118.4 kg)  Height: 5' 5.5" (1.664 m)     Body mass index is 42.77 kg/m.  Physical Exam:   Physical Exam  Constitutional: She appears well-nourished.  HENT:  Head: Normocephalic and atraumatic.  Eyes: Pupils are equal, round, and reactive to light. EOM are normal.  Neck: Normal range of motion. Neck supple.  Cardiovascular: Normal rate, regular rhythm, normal heart sounds and intact distal pulses.  Pulmonary/Chest: Effort normal.  Abdominal: Soft.  Skin: Skin is warm.  Psychiatric: She has a normal mood and affect. Her behavior is normal.  Nursing note and vitals reviewed.  Results for orders placed or performed in visit on 06/16/18  VITAMIN D 25 Hydroxy (Vit-D Deficiency, Fractures)  Result Value Ref Range   VITD 19.11 (L) 30.00 - 100.00 ng/mL  Vitamin B12  Result Value Ref Range   Vitamin B-12 315 211 - 911 pg/mL  T4, free  Result Value Ref Range   Free T4 1.25 0.60 - 1.60 ng/dL  TSH  Result Value Ref Range   TSH 0.08 (L) 0.35 - 4.50 uIU/mL  CBC with Differential/Platelet  Result Value Ref Range   WBC 7.6 4.0 - 10.5 K/uL   RBC  4.37 3.87 - 5.11 Mil/uL   Hemoglobin 11.6 (L) 12.0 - 15.0 g/dL   HCT 35.8 (L) 36.0 - 46.0 %   MCV 81.8 78.0 - 100.0 fl   MCHC 32.4 30.0 - 36.0 g/dL   RDW 16.4 (H) 11.5 - 15.5 %   Platelets 292.0 150.0 - 400.0 K/uL   Neutrophils Relative % 67.1 43.0 - 77.0 %   Lymphocytes Relative 23.8 12.0 - 46.0 %   Monocytes Relative 7.2 3.0 - 12.0 %   Eosinophils Relative 1.3 0.0 -  5.0 %   Basophils Relative 0.6 0.0 - 3.0 %   Neutro Abs 5.1 1.4 - 7.7 K/uL   Lymphs Abs 1.8 0.7 - 4.0 K/uL   Monocytes Absolute 0.6 0.1 - 1.0 K/uL   Eosinophils Absolute 0.1 0.0 - 0.7 K/uL   Basophils Absolute 0.0 0.0 - 0.1 K/uL  Comprehensive metabolic panel  Result Value Ref Range   Sodium 138 135 - 145 mEq/L   Potassium 4.2 3.5 - 5.1 mEq/L   Chloride 106 96 - 112 mEq/L   CO2 27 19 - 32 mEq/L   Glucose, Bld 106 (H) 70 - 99 mg/dL   BUN 11 6 - 23 mg/dL   Creatinine, Ser 0.92 0.40 - 1.20 mg/dL   Total Bilirubin 0.4 0.2 - 1.2 mg/dL   Alkaline Phosphatase 89 39 - 117 U/L   AST 11 0 - 37 U/L   ALT 15 0 - 35 U/L   Total Protein 7.0 6.0 - 8.3 g/dL   Albumin 4.1 3.5 - 5.2 g/dL   Calcium 9.0 8.4 - 10.5 mg/dL   GFR 74.50 >60.00 mL/min  Lipid panel  Result Value Ref Range   Cholesterol 156 0 - 200 mg/dL   Triglycerides 92.0 0.0 - 149.0 mg/dL   HDL 40.80 >39.00 mg/dL   VLDL 18.4 0.0 - 40.0 mg/dL   LDL Cholesterol 96 0 - 99 mg/dL   Total CHOL/HDL Ratio 4    NonHDL 114.84     Assessment and Plan:   Janalee was seen today for establish care.  Diagnoses and all orders for this visit:  GAD (generalized anxiety disorder) Comments: Okay to start Lexapro. Previously did well on medication.  Orders: -     escitalopram (LEXAPRO) 10 MG tablet; Take 1 tablet (10 mg total) by mouth daily. -     clonazePAM (KLONOPIN) 0.5 MG tablet; Take 1 tablet (0.5 mg total) by mouth 2 (two) times daily as needed. For anxiety  Malaise and fatigue -     Vitamin B12 -     CBC with Differential/Platelet -     Comprehensive metabolic  panel -     ANA  Lipid screening -     Lipid panel  Hyperglycemia -     Hemoglobin A1c; Future  Malignant neoplasm of thyroid gland ([place]) Comments: Resolved. Followed by Endocrinology.  Morbid obesity ([place]) Comments: Reviewed healthy food choices and exercise for energy.  Vitamin D deficiency -     VITAMIN D 25 Hydroxy (Vit-D Deficiency, Fractures)  Postoperative hypothyroidism Comments: Followed by Endocrinology. Postsurgical, after thyroid cancer removal. Endorses some fatigue.  Orders: -     T4, free -     TSH  Microcytic anemia -     CBC with Differential/Platelet  Rash and nonspecific skin eruption Comments: Intermittent. Face, arms. Takes pictures. Worried about autoimmune issue. No identified triggers yet.  Orders: -     ANA  Insulin resistance, mild Comments: A1c 5.7 with fasting hyperglycemia.    . Reviewed expectations re: course of current medical issues. . Discussed self-management of symptoms. . Outlined signs and symptoms indicating need for more acute intervention. . Patient verbalized understanding and all questions were answered. Marland Kitchen Health Maintenance issues including appropriate healthy diet, exercise, and smoking avoidance were discussed with patient. . See orders for this visit as documented in the electronic medical record. . Patient received an After Visit Summary.  CMA served as Education administrator during this visit. History, Physical, and Plan performed by medical provider. The above documentation has  been reviewed and is accurate and complete. Briscoe Deutscher, D.O.  Briscoe Deutscher, DO [place], [facility] 06/20/2018

## 2018-06-20 ENCOUNTER — Encounter: Payer: Self-pay | Admitting: Family Medicine

## 2018-06-20 DIAGNOSIS — F411 Generalized anxiety disorder: Secondary | ICD-10-CM | POA: Insufficient documentation

## 2018-06-21 LAB — ANA: Anti Nuclear Antibody(ANA): NEGATIVE

## 2018-07-07 ENCOUNTER — Ambulatory Visit (INDEPENDENT_AMBULATORY_CARE_PROVIDER_SITE_OTHER): Payer: 59 | Admitting: Family Medicine

## 2018-07-07 ENCOUNTER — Encounter: Payer: Self-pay | Admitting: Family Medicine

## 2018-07-07 ENCOUNTER — Other Ambulatory Visit (HOSPITAL_COMMUNITY)
Admission: RE | Admit: 2018-07-07 | Discharge: 2018-07-07 | Disposition: A | Discharge status: Home / Self Care | Payer: 59 | Source: Ambulatory Visit | Attending: Family Medicine | Admitting: Family Medicine

## 2018-07-07 VITALS — BP 126/80 | HR 100 | Temp 98.6°F | Ht 65.5 in | Wt 262.8 lb

## 2018-07-07 DIAGNOSIS — Z124 Encounter for screening for malignant neoplasm of cervix: Secondary | ICD-10-CM | POA: Diagnosis not present

## 2018-07-07 DIAGNOSIS — Z0000 Encounter for general adult medical examination without abnormal findings: Secondary | ICD-10-CM

## 2018-07-07 DIAGNOSIS — Z1331 Encounter for screening for depression: Secondary | ICD-10-CM

## 2018-07-07 DIAGNOSIS — Z114 Encounter for screening for human immunodeficiency virus [HIV]: Secondary | ICD-10-CM | POA: Diagnosis not present

## 2018-07-07 NOTE — Progress Notes (Signed)
Subjective:    Lisa Bolton is a 32 y.o. female and is here for a comprehensive physical exam.  Current Outpatient Medications:  .  clonazePAM (KLONOPIN) 0.5 MG tablet, Take 1 tablet (0.5 mg total) by mouth 2 (two) times daily as needed. For anxiety, Disp: 30 tablet, Rfl: 1 .  EPINEPHrine 0.3 mg/0.3 mL IJ SOAJ injection, Use as directed for severe allergic reaction, Disp: 2 Device, Rfl: 1 .  escitalopram (LEXAPRO) 10 MG tablet, Take 1 tablet (10 mg total) by mouth daily., Disp: 30 tablet, Rfl: 3 .  levothyroxine (SYNTHROID, LEVOTHROID) 125 MCG tablet, Take 125 mcg by mouth daily before breakfast., Disp: , Rfl:  .  Probiotic Product (PROBIOTIC-10) CAPS, Take by mouth., Disp: , Rfl:   Health Maintenance Due  Topic Date Due  . HIV Screening  12/21/1999  . PAP SMEAR  11/13/2017  . INFLUENZA VACCINE  05/06/2018   PMHx, SurgHx, SocialHx, Medications, and Allergies were reviewed in the Visit Navigator and updated as appropriate.   Past Medical History:  Diagnosis Date  . Anemia   . Chronic vaginitis   . Family history of anesthesia complication    UNCLE WITH  PSUEDOCHOLINESTEROSE  DEFIENCY  . GERD (gastroesophageal reflux disease)   . Malignant neoplasm of thyroid gland ([place]) 10/11/2012  . Panic attacks   . Recurrent boils, vulva      Past Surgical History:  Procedure Laterality Date  . ANTERIOR CRUCIATE LIGAMENT REPAIR Left 2001 - approximate  . LYMPH NODE DISSECTION  10/29/2012   Procedure: LYMPH NODE DISSECTION;  Surgeon: Adin Hector, MD;  Location: [facility];  Service: General;  Laterality: N/A;  central compartment lymph node dissection   . THYROID LOBECTOMY  09/21/2012   Procedure: THYROID LOBECTOMY;  Surgeon: Adin Hector, MD;  [facility];  Service: General;  Laterality: Right;  Right Thyroid Lobectomy  . THYROID LOBECTOMY  10/29/2012   Procedure: THYROID LOBECTOMY;  Surgeon: Adin Hector, MD;  Location: [facility];  Service: General;  Laterality: Left;  left  thyroid lobectomy,central compartmental lymph node dissection     Family History  Problem Relation Age of Onset  . Muscular dystrophy Mother   . Hypertension Mother   . Heart attack Mother   . Thyroid cancer Mother   . Anxiety disorder Brother   . Arthritis Maternal Grandmother   . Parkinson's disease Maternal Grandmother   . Heart disease Paternal Grandmother   . Heart attack Paternal Grandfather   . Allergic rhinitis Neg Hx   . Angioedema Neg Hx   . Asthma Neg Hx   . Eczema Neg Hx     Social History   Tobacco Use  . Smoking status: Never Smoker  . Smokeless tobacco: Never Used  Substance Use Topics  . Alcohol use: Yes    Alcohol/week: 4.0 standard drinks    Types: 2 Glasses of wine, 2 Cans of beer per week    Comment: socially  . Drug use: No    Review of Systems:   Pertinent items are noted in the HPI. Otherwise, ROS is negative.  Objective:   BP 126/80   Pulse 100   Temp 98.6 F (37 C) (Oral)   Ht 5' 5.5" (1.664 m)   Wt 262 lb 12.8 oz (119.2 kg)   SpO2 97%   BMI 43.07 kg/m    General appearance: alert, cooperative and appears stated age. Head: normocephalic, without obvious abnormality, atraumatic. Neck: no adenopathy, supple, symmetrical, trachea midline; thyroid not enlarged,  symmetric, no tenderness/mass/nodules. Lungs: clear to auscultation bilaterally. Heart: regular rate and rhythm Abdomen: soft, non-tender; no masses,  no organomegaly. Extremities: extremities normal, atraumatic, no cyanosis or edema. Skin: skin color, texture, turgor normal, no rashes or lesions. Lymph: cervical, supraclavicular, and axillary nodes normal; no abnormal inguinal nodes palpated. Neurologic: grossly normal.  Pelvic:  External genitalia: no lesions.              Urethra: normal appearing urethra with no masses, tenderness or lesions.              Bartholins and Skenes: normal.               Vagina: normal appearing vagina with normal color and discharge, no  lesions.              Cervix: normal appearance.              Pap and high risk HPV testing done: Yes.          Bimanual Exam:   Uterus: uterus is normal size, shape, consistency and nontender.                                      Adnexa: normal adnexa in size, nontender and no masses.  Assessment/Plan:   [name] was seen today for annual exam.  Diagnoses and all orders for this visit:  Routine physical examination  Pap smear for cervical cancer screening -     Cytology - PAP  Encounter for screening for HIV -     HIV Antibody (routine testing w rflx)   Patient Counseling:   [x]     Nutrition: Stressed importance of moderation in sodium/caffeine intake, saturated fat and cholesterol, caloric balance, sufficient intake of fresh fruits, vegetables, fiber, calcium, iron, and 1 mg of folate supplement per day (for females capable of pregnancy).   [x]      Stressed the importance of regular exercise.    [x]     Substance Abuse: Discussed cessation/primary prevention of tobacco, alcohol, or other drug use; driving or other dangerous activities under the influence; availability of treatment for abuse.    [x]      Injury prevention: Discussed safety belts, safety helmets, smoke detector, smoking near bedding or upholstery.    [x]      Sexuality: Discussed sexually transmitted diseases, partner selection, use of condoms, avoidance of unintended pregnancy  and contraceptive alternatives.    [x]     Dental health: Discussed importance of regular tooth brushing, flossing, and dental visits.   [x]      Health maintenance and immunizations reviewed. Please refer to Health maintenance section.   Briscoe Deutscher, DO [place]

## 2018-07-08 LAB — CYTOLOGY - PAP
Bacterial vaginitis: NEGATIVE
Candida vaginitis: NEGATIVE
Chlamydia: NEGATIVE
Diagnosis: NEGATIVE
HPV: NOT DETECTED
Neisseria Gonorrhea: NEGATIVE
Trichomonas: NEGATIVE

## 2018-07-08 LAB — HIV ANTIBODY (ROUTINE TESTING W REFLEX): HIV 1&2 Ab, 4th Generation: NONREACTIVE

## 2018-07-16 ENCOUNTER — Ambulatory Visit (INDEPENDENT_AMBULATORY_CARE_PROVIDER_SITE_OTHER): Payer: 59 | Admitting: Allergy

## 2018-07-16 ENCOUNTER — Encounter: Payer: Self-pay | Admitting: Allergy

## 2018-07-16 VITALS — BP 132/76 | HR 78 | Temp 98.1°F | Resp 16 | Ht 65.0 in | Wt 257.2 lb

## 2018-07-16 DIAGNOSIS — T8052XD Anaphylactic reaction due to vaccination, subsequent encounter: Secondary | ICD-10-CM | POA: Diagnosis not present

## 2018-07-16 DIAGNOSIS — J310 Chronic rhinitis: Secondary | ICD-10-CM

## 2018-07-16 DIAGNOSIS — T7800XD Anaphylactic reaction due to unspecified food, subsequent encounter: Secondary | ICD-10-CM | POA: Diagnosis not present

## 2018-07-16 DIAGNOSIS — Z23 Encounter for immunization: Secondary | ICD-10-CM | POA: Diagnosis not present

## 2018-07-16 DIAGNOSIS — T50Z95D Adverse effect of other vaccines and biological substances, subsequent encounter: Secondary | ICD-10-CM | POA: Diagnosis not present

## 2018-07-16 NOTE — Patient Instructions (Addendum)
Adverse effect of vaccine Flu vaccine given in split dosing today and given total of 0.5mg  (full dose).  Observed for 1 hr without adverse event.   Let us know if any symptoms develop later today.   Will be ok to get full dose next year and subsequent years without issue.   Food allergy Continue avoidance of shellfish Have access to self-injectable epinephrine (Epipen or AuviQ) 0.3mg  at all times Follow emergency action plan in case of allergic reaction previously provided  Rhinitis, presumed allergic Advised she can continue use of Claritin 10 mg daily as needed Advised that she can have environmental allergy testing at any point in the future to determine if she has allergic rhinitis  Follow-up 1 year or sooner if needed

## 2018-07-16 NOTE — Progress Notes (Signed)
Follow-up Note  RE: Lisa Bolton MRN: [id number] DOB: 1986-09-04 Date of Office Visit: 07/16/2018   History of present illness: Lisa Bolton is a 32 y.o. female presenting today for follow-up/influenza administration.  She was last seen in the office on July 16, 2017 by myself for initial encounter.  She denies any major health changes, surgeries or hospitalizations since her last visit.  Regards her history of receiving the flu vaccine last year and states about 2 to 3 hours or so she broke out in a splotchy rash on her arms and had itching of the roof of her mouth.  She states prior to receiving the flu vaccine she was eating at a restaurant and thus she is not sure if the symptoms were related to possible food reaction or to the flu injection.  She has never had any issues with previous flu vaccines before last year.  She does continue to avoid this shellfish and fish.  She also states that she has eliminated peanuts from her diet but is able to eat tree nuts without any issue.  She does have access to an epinephrine device.  She also states that she has been having some issues with seasonal allergy symptoms including nasal congestion and drainage.  She denies having any previous history of seasonal allergies.  She did take a Claritin this morning in preparation for receiving a flu vaccine today.   Review of systems: Review of Systems  Constitutional: Negative for chills, fever and malaise/fatigue.  HENT: Negative for congestion, ear discharge, nosebleeds and sore throat.   Eyes: Negative for pain, discharge and redness.  Respiratory: Negative for cough, shortness of breath and wheezing.   Cardiovascular: Negative for chest pain.  Gastrointestinal: Negative for abdominal pain, constipation, diarrhea, heartburn, nausea and vomiting.  Musculoskeletal: Negative for joint pain.  Skin: Negative for itching and rash.  Neurological: Negative for headaches.    All other  systems negative unless noted above in HPI  Past medical/social/surgical/family history have been reviewed and are unchanged unless specifically indicated below.  No changes  Medication List: Allergies as of 07/16/2018      Reactions   Shellfish Allergy    Other Other (See Comments)   Anethesia   Pertussis Vaccines       Medication List        Accurate as of 07/16/18  1:20 PM. Always use your most recent med list.          clonazePAM 0.5 MG tablet Commonly known as:  KLONOPIN Take 1 tablet (0.5 mg total) by mouth 2 (two) times daily as needed. For anxiety   EPINEPHrine 0.3 mg/0.3 mL Soaj injection Commonly known as:  EPI-PEN Use as directed for severe allergic reaction   escitalopram 10 MG tablet Commonly known as:  LEXAPRO Take 1 tablet (10 mg total) by mouth daily.   levothyroxine 125 MCG tablet Commonly known as:  SYNTHROID, LEVOTHROID Take 125 mcg by mouth daily before breakfast.   loratadine 10 MG tablet Commonly known as:  CLARITIN Take 10 mg by mouth daily.   PROBIOTIC-10 Caps Take by mouth.       Known medication allergies: Allergies  Allergen Reactions  . Shellfish Allergy   . Other Other (See Comments)    Anethesia  . Pertussis Vaccines      Physical examination: Blood pressure 132/76, pulse 78, temperature 98.1 F (36.7 C), temperature source Oral, resp. rate 16, height 5\' 5"  (1.651 m), weight 257 lb 3.2 oz (116.7  kg), SpO2 99 %.  General: Alert, interactive, in no acute distress. HEENT: PERRLA, TMs pearly gray, turbinates non-edematous without discharge, post-pharynx non erythematous. Neck: Supple without lymphadenopathy. Lungs: Clear to auscultation without wheezing, rhonchi or rales. {no increased work of breathing. CV: Normal S1, S2 without murmurs. Abdomen: Nondistended, nontender. Skin: Warm and dry, without lesions or rashes. Extremities:  No clubbing, cyanosis or edema. Neuro:   Grossly intact.  Diagnositics/Labs: Labs:    Component     Latest Ref Rng & Units 07/16/2017  Codfish IgE     Class 0 kU/L <0.10  Halibut IgE     Class 0 kU/L <0.10  Allergen [place] IgE     Class 0 kU/L <0.10  Tuna     Class 0 kU/L <0.10  Allergen Salmon IgE     Class 0 kU/L <0.10  Allergen Mackerel IgE     Class 0 kU/L <0.10  Allergen Trout IgE     Class 0 kU/L <0.10  Peanut IgE     Class 0 kU/L <0.10  Hazelnut ([place]) IgE     Class 0 kU/L <0.10  [place] Nut IgE     Class 0 kU/L <0.10  F020-IgE Almond     Class 0 kU/L <0.10  Pecan Nut IgE     Class 0 kU/L <0.10  F202-IgE Cashew Nut     Class 0 kU/L <0.10  Walnut IgE     Class 0 kU/L <0.10  Clam IgE     Class 0 kU/L <0.10  F023-IgE Crab     Class III kU/L 3.29 (A)  Shrimp IgE     Class IV kU/L 7.35 (A)  Scallop IgE     Class 0/I kU/L 0.26 (A)  F290-IgE Oyster     Class 0 kU/L <0.10  F080-IgE Lobster     Class 0/I kU/L 0.11 (A)  Egg White IgE     Class 0 kU/L <0.10    She was provided with the flu vaccine and a split dose of 0.25 mg separated by 30 minutes and then the second 0.25 mg given.  She was observed for a total of 1 hour and she did not have any adverse events.  Assessment and plan:   Adverse effect of vaccine/ need for influenza vaccine Flu vaccine given in split dosing today and given total of 0.5mg  (full dose).  Observed for 1 hr without adverse event.   Let us know if any symptoms develop later today.   Will be ok to get full dose next year and subsequent years without issue.   Food allergy Continue avoidance of shellfish Have access to self-injectable epinephrine (Epipen or AuviQ) 0.3mg  at all times Follow emergency action plan in case of allergic reaction previously provided  Rhinitis, presumed allergic Advised she can continue use of Claritin 10 mg daily as needed Advised that she can have environmental allergy testing at any point in the future to determine if she has allergic rhinitis  Follow-up 1 year or sooner if  needed  I appreciate the opportunity to take part in Lisa Bolton's care. Please do not hesitate to contact me with questions.  Sincerely,   Prudy Feeler, MD Allergy/Immunology [facility] [place]

## 2018-08-02 MED FILL — ESCITALOPRAM 10 MG TABLET: 10 | 30 days supply | Qty: 30 | Fill #1

## 2018-08-20 ENCOUNTER — Ambulatory Visit (INDEPENDENT_AMBULATORY_CARE_PROVIDER_SITE_OTHER): Payer: 59 | Admitting: Family Medicine

## 2018-08-20 ENCOUNTER — Encounter: Payer: Self-pay | Admitting: Family Medicine

## 2018-08-20 VITALS — BP 124/72 | HR 94 | Resp 18

## 2018-08-20 DIAGNOSIS — L282 Other prurigo: Secondary | ICD-10-CM

## 2018-08-20 DIAGNOSIS — T7800XA Anaphylactic reaction due to unspecified food, initial encounter: Secondary | ICD-10-CM | POA: Insufficient documentation

## 2018-08-20 DIAGNOSIS — J45990 Exercise induced bronchospasm: Secondary | ICD-10-CM | POA: Diagnosis not present

## 2018-08-20 DIAGNOSIS — J302 Other seasonal allergic rhinitis: Secondary | ICD-10-CM | POA: Diagnosis not present

## 2018-08-20 DIAGNOSIS — J3089 Other allergic rhinitis: Secondary | ICD-10-CM | POA: Diagnosis not present

## 2018-08-20 DIAGNOSIS — T7800XD Anaphylactic reaction due to unspecified food, subsequent encounter: Secondary | ICD-10-CM

## 2018-08-20 MED ORDER — TRIAMCINOLONE ACETONIDE 0.1 % EX CREA: 1.0000 "application " | TOPICAL_CREAM | Freq: Two times a day (BID) | CUTANEOUS | 0 refills | Status: DC

## 2018-08-20 MED ORDER — ALBUTEROL SULFATE 108 (90 BASE) MCG/ACT IN AEPB: 2.0000 | INHALATION_SPRAY | RESPIRATORY_TRACT | 5 refills | Status: DC | PRN

## 2018-08-20 MED FILL — PROAIR RESPICLICK INHAL PWD: 108 (90 BAS | 17 days supply | Qty: 1 | Fill #0

## 2018-08-20 NOTE — Progress Notes (Signed)
[place] [address] Dept: [phone]  FOLLOW UP NOTE  Patient ID: Lisa Bolton, female    DOB: 1986/03/22  Age: 31 y.o. MRN: [id number] Date of Office Visit: 08/20/2018  Assessment  Chief Complaint: Allergy Testing (environmental, peanuts and tree nuts)  HPI Lisa Bolton is a 32 year old female who presents to the clinic for a follow up visit. She reports she is interested in environmental testing today. She reports that, for the last 2-3 years, during the winter only, she will occasionally wake up with red pinpoint spots on her lower arms and occasionally shoulder and neck area that resolve with no medical intervention about an hour after getting out of bed. She reports rhinitis as intermittent with a possible seasonal component for which she takes an antihistamine once a day. She continues to avoid shellfish and carry an EpiPen. She reports occasional shortness of breath and wheezing when she is doing vigorous activity. She has never used an inhaler before. Her current medications are listed in the chart.   Drug Allergies:  Allergies  Allergen Reactions  . Shellfish Allergy   . Other Other (See Comments)    Anethesia  . Pertussis Vaccines     Physical Exam: BP 124/72 (BP Location: Right Arm, Patient Position: Sitting, Cuff Size: Normal)   Pulse 94   Resp 18   SpO2 98%    Physical Exam  Constitutional: She is oriented to person, place, and time. She appears well-developed and well-nourished.  HENT:  Head: Normocephalic.  Right Ear: External ear normal.  Left Ear: External ear normal.  Mouth/Throat: Oropharynx is clear and moist.  Bilateral nares slightly erythematous with no nasal drainage noted. Pharynx normal. Ears normal. Eyes normal.   Eyes: Conjunctivae are normal.  Neck: Normal range of motion. Neck supple.  Cardiovascular: Normal rate, regular rhythm and normal heart sounds.  No murmur noted  Pulmonary/Chest: Effort normal and  breath sounds normal.  Lungs clear to auscultation  Musculoskeletal: Normal range of motion.  Neurological: She is alert and oriented to person, place, and time.  Skin: Skin is warm and dry.  Psychiatric: She has a normal mood and affect. Her behavior is normal. Judgment and thought content normal.  Vitals reviewed.   Diagnostics: FVC 3.65, FEV2 2.84. Predicted FVC 3.88, predicted FEV1 3.23. Spirometry is within the normal range.   Assessment and Plan: 1. Anaphylactic shock due to food, subsequent encounter   2. Exercise-induced bronchospasm   3. Seasonal and perennial allergic rhinitis   4. Papular urticaria     Meds ordered this encounter  Medications  . Albuterol Sulfate (PROAIR RESPICLICK) 093 (90 Base) MCG/ACT AEPB    Sig: Inhale 2 puffs into the lungs every 4 (four) hours as needed (5-15 minutes before exercise).    Dispense:  1 each    Refill:  5  . triamcinolone cream (KENALOG) 0.1 %    Sig: Apply 1 application topically 2 (two) times daily.    Dispense:  30 g    Refill:  0    Patient Instructions  Food allergy Your skin testing today was negative to fish, peanut, and tree nuts.  Continue to avoid shellfish. In case of an allergic reaction, take Benadryl 50 mg every 4 hours, and if life-threatening symptoms occur, inject with EpiPen 0.3 mg.  Allergic rhinitis Your skin testing was positive to dust mites, cockroach, molds, ragweed, and cat hair. Allergen avoidance measures provided in written form. Continue Claritin 10 mg once  a day as needed for a runny nose or itch Flonase 1-2 sprays in each nostril once a day as needed for a stuffy nose If these medications do not control your symptoms, you may want to consider allergen immunotherapy  Rash Continue a daily moisturizer Place dust mite covers on mattress and pillows Antihistamine once a day as above For red or itchy areas apply triamcinolone 0.1% cream twice a day as needed  Exercise induced asthma Begin  ProAir 2 puffs 5-15 minutes before exercise to prevent cough and wheeze  Follow up in 6 months or sooner if needed   Return in about 6 months (around 02/18/2019), or if symptoms worsen or fail to improve.    Thank you for the opportunity to care for this patient.  Please do not hesitate to contact me with questions.  Gareth Morgan, FNP [facility]

## 2018-08-20 NOTE — Patient Instructions (Addendum)
Food allergy Your skin testing today was negative to fish, peanut, and tree nuts.  Continue to avoid shellfish. In case of an allergic reaction, take Benadryl 50 mg every 4 hours, and if life-threatening symptoms occur, inject with EpiPen 0.3 mg.  Allergic rhinitis Your skin testing was positive to dust mites, cockroach, molds, ragweed, and cat hair. Allergen avoidance measures provided in written form. Continue Claritin 10 mg once a day as needed for a runny nose or itch Flonase 1-2 sprays in each nostril once a day as needed for a stuffy nose If these medications do not control your symptoms, you may want to consider allergen immunotherapy  Rash Continue a daily moisturizer Place dust mite covers on mattress and pillows Antihistamine once a day as above For red or itchy areas apply triamcinolone 0.1% cream twice a day as needed  Exercise induced asthma Begin ProAir 2 puffs 5-15 minutes before exercise to prevent cough and wheeze  Follow up in 6 months or sooner if needed

## 2018-08-31 MED FILL — LEVOTHYROXINE 125 MCG TAB: 125 | 90 days supply | Qty: 100 | Fill #0

## 2018-08-31 MED FILL — ESCITALOPRAM 10 MG TABLET: 10 | 30 days supply | Qty: 30 | Fill #2

## 2018-10-01 MED FILL — ESCITALOPRAM 10 MG TABLET: 10 | 30 days supply | Qty: 30 | Fill #3

## 2018-10-05 ENCOUNTER — Other Ambulatory Visit: Payer: Self-pay | Admitting: Family Medicine

## 2018-10-05 DIAGNOSIS — T7800XA Anaphylactic reaction due to unspecified food, initial encounter: Secondary | ICD-10-CM

## 2018-10-07 ENCOUNTER — Other Ambulatory Visit: Payer: Self-pay

## 2018-10-07 DIAGNOSIS — T7800XA Anaphylactic reaction due to unspecified food, initial encounter: Secondary | ICD-10-CM

## 2018-10-07 MED ORDER — EPINEPHRINE 0.3 MG/0.3ML IJ SOAJ: INTRAMUSCULAR | 1 refills | Status: DC

## 2018-10-07 MED FILL — EPINEPHRINE 0.3 MG AUTO-INJ: 0.3 | 25 days supply | Qty: 2 | Fill #0

## 2018-10-12 MED FILL — clonazePAM 0.5 MG TABS: 0.5 | 15 days supply | Qty: 30 | Fill #0

## 2018-11-01 ENCOUNTER — Other Ambulatory Visit: Payer: Self-pay | Admitting: Family Medicine

## 2018-11-01 DIAGNOSIS — F411 Generalized anxiety disorder: Secondary | ICD-10-CM

## 2018-11-01 MED FILL — ESCITALOPRAM 10 MG TABLET: 10 | 30 days supply | Qty: 30 | Fill #0

## 2018-11-04 NOTE — Progress Notes (Signed)
Lisa Bolton is a 33 y.o. female is here for follow up.  History of Present Illness:   Lisa Bolton, CMA acting as scribe for Dr. Briscoe Deutscher.   HPI: Patient in office for follow up on generalized anxiety disorder. Current symptoms: none. No current suicidal and homicidal ideation. Side effects from treatment: none. Starting new job soon and excited.  There are no preventive care reminders to display for this patient.   Depression screen [facility] 2/9 11/05/2018 07/07/2018 06/16/2018  Decreased Interest 0 0 0  Down, Depressed, Hopeless 0 0 0  PHQ - 2 Score 0 0 0  Altered sleeping 0 0 1  Tired, decreased energy 0 0 0  Change in appetite 0 0 0  Feeling bad or failure about yourself  0 0 0  Trouble concentrating 0 0 0  Moving slowly or fidgety/restless 0 0 0  Suicidal thoughts 0 0 0  PHQ-9 Score 0 0 1  Difficult doing work/chores Not difficult at all Not difficult at all Not difficult at all   PMHx, SurgHx, SocialHx, FamHx, Medications, and Allergies were reviewed in the Visit Navigator and updated as appropriate.   Patient Active Problem List   Diagnosis Date Noted  . Non-seasonal allergic rhinitis 08/20/2018  . Anaphylactic shock due to adverse food reaction 08/20/2018  . Exercise-induced bronchospasm 08/20/2018  . Seasonal and perennial allergic rhinitis 08/20/2018  . Papular urticaria 08/20/2018  . GAD (generalized anxiety disorder) 06/20/2018  . Morbid obesity ([place]) 06/20/2018   Social History   Tobacco Use  . Smoking status: Never Smoker  . Smokeless tobacco: Never Used  Substance Use Topics  . Alcohol use: Yes    Alcohol/week: 4.0 standard drinks    Types: 2 Glasses of wine, 2 Cans of beer per week    Comment: socially  . Drug use: No   Current Medications and Allergies   .  clonazePAM (KLONOPIN) 0.5 MG tablet, Take 1 tablet (0.5 mg total) by mouth 2 (two) times daily as needed. For anxiety, Disp: 30 tablet, Rfl: 1 .  EPINEPHrine 0.3 mg/0.3 mL IJ SOAJ  injection, INJECT AS DIRECTED FOR SEVERE ALLERGIC REACTION, Disp: 2 Device, Rfl: 1 .  escitalopram (LEXAPRO) 10 MG tablet, TAKE 1 TABLET BY MOUTH DAILY., Disp: 30 tablet, Rfl: 3 .  levothyroxine (SYNTHROID, LEVOTHROID) 125 MCG tablet, Take 125 mcg by mouth daily before breakfast., Disp: , Rfl:  .  loratadine (CLARITIN) 10 MG tablet, Take 10 mg by mouth daily., Disp: , Rfl:  .  Probiotic Product (PROBIOTIC-10) CAPS, Take by mouth., Disp: , Rfl:    Allergies  Allergen Reactions  . Shellfish Allergy   . Other Other (See Comments)    Anethesia  . Pertussis Vaccines    Review of Systems   Pertinent items are noted in the HPI. Otherwise, a complete ROS is negative.  Vitals   Vitals:   11/05/18 1311  BP: 122/74  Pulse: 88  Temp: 98.6 F (37 C)  TempSrc: Oral  SpO2: 99%  Weight: 266 lb (120.7 kg)  Height: 5\' 5"  (1.651 m)     Body mass index is 44.26 kg/m.  Physical Exam   Physical Exam Vitals signs and nursing note reviewed.  HENT:     Head: Normocephalic and atraumatic.  Eyes:     Pupils: Pupils are equal, round, and reactive to light.  Neck:     Musculoskeletal: Normal range of motion and neck supple.  Cardiovascular:     Rate and Rhythm: Normal rate  and regular rhythm.     Heart sounds: Normal heart sounds.  Pulmonary:     Effort: Pulmonary effort is normal.  Abdominal:     Palpations: Abdomen is soft.  Skin:    General: Skin is warm.  Psychiatric:        Behavior: Behavior normal.    Assessment and Plan   [name] was seen today for follow-up.  Diagnoses and all orders for this visit:  GAD (generalized anxiety disorder) Comments: Continue with current medications. Doing well. Orders: -     clonazePAM (KLONOPIN) 0.5 MG tablet; Take 1 tablet (0.5 mg total) by mouth 2 (two) times daily as needed. For anxiety -     escitalopram (LEXAPRO) 10 MG tablet; Take 1 tablet (10 mg total) by mouth daily.   . Orders and follow up as documented in [place], reviewed  diet, exercise and weight control, cardiovascular risk and specific lipid/LDL goals reviewed, reviewed medications and side effects in detail.  . Reviewed expectations re: course of current medical issues. . Outlined signs and symptoms indicating need for more acute intervention. . Patient verbalized understanding and all questions were answered. . Patient received an After Visit Summary.  CMA served as Education administrator during this visit. History, Physical, and Plan performed by medical provider. The above documentation has been reviewed and is accurate and complete. Briscoe Deutscher, D.O.  Briscoe Deutscher, DO [place], [place] 11/06/2018

## 2018-11-05 ENCOUNTER — Encounter: Payer: Self-pay | Admitting: Family Medicine

## 2018-11-05 ENCOUNTER — Ambulatory Visit (INDEPENDENT_AMBULATORY_CARE_PROVIDER_SITE_OTHER): Payer: 59 | Admitting: Family Medicine

## 2018-11-05 DIAGNOSIS — F411 Generalized anxiety disorder: Secondary | ICD-10-CM

## 2018-11-05 MED ORDER — CLONAZEPAM 0.5 MG PO TABS: 0.5000 mg | ORAL_TABLET | Freq: Two times a day (BID) | ORAL | 1 refills | Status: DC | PRN

## 2018-11-05 MED ORDER — ESCITALOPRAM OXALATE 10 MG PO TABS: 10.0000 mg | ORAL_TABLET | Freq: Every day | ORAL | 3 refills | Status: DC

## 2018-11-06 ENCOUNTER — Encounter: Payer: Self-pay | Admitting: Family Medicine

## 2018-11-30 MED FILL — ESCITALOPRAM 10 MG TABLET: 10 | 30 days supply | Qty: 30 | Fill #1

## 2018-12-07 MED FILL — LEVOTHYROXINE 125 MCG TAB: 125 | 82 days supply | Qty: 92 | Fill #1

## 2018-12-22 MED FILL — ESCITALOPRAM 10 MG TABLET: 10 | 30 days supply | Qty: 30 | Fill #2 | Status: TO

## 2019-01-15 MED FILL — ESCITALOPRAM 10 MG TABLET: 10 | 30 days supply | Qty: 30 | Fill #0

## 2019-02-05 MED FILL — LEVOTHYROXINE 125 MCG TAB: 125 | 90 days supply | Qty: 96 | Fill #0 | Status: TO

## 2019-02-08 MED FILL — ESCITALOPRAM 10 MG TABLET: 10 | 90 days supply | Qty: 90 | Fill #0 | Status: TO

## 2019-02-11 DIAGNOSIS — C73 Malignant neoplasm of thyroid gland: Secondary | ICD-10-CM | POA: Diagnosis not present

## 2019-02-11 DIAGNOSIS — Z1331 Encounter for screening for depression: Secondary | ICD-10-CM | POA: Diagnosis not present

## 2019-02-11 DIAGNOSIS — E559 Vitamin D deficiency, unspecified: Secondary | ICD-10-CM | POA: Diagnosis not present

## 2019-02-11 DIAGNOSIS — F419 Anxiety disorder, unspecified: Secondary | ICD-10-CM | POA: Diagnosis not present

## 2019-02-11 DIAGNOSIS — E890 Postprocedural hypothyroidism: Secondary | ICD-10-CM | POA: Diagnosis not present

## 2019-02-15 DIAGNOSIS — E890 Postprocedural hypothyroidism: Secondary | ICD-10-CM | POA: Diagnosis not present

## 2019-02-15 DIAGNOSIS — C73 Malignant neoplasm of thyroid gland: Secondary | ICD-10-CM | POA: Diagnosis not present

## 2019-03-09 ENCOUNTER — Ambulatory Visit: Payer: 59 | Admitting: Physician Assistant

## 2019-04-10 ENCOUNTER — Encounter: Payer: Self-pay | Admitting: Family Medicine

## 2019-04-11 DIAGNOSIS — Z20828 Contact with and (suspected) exposure to other viral communicable diseases: Secondary | ICD-10-CM | POA: Diagnosis not present

## 2019-05-26 MED FILL — ESCITALOPRAM 10 MG TABLET: 10 | 90 days supply | Qty: 90 | Fill #0

## 2019-06-06 MED FILL — LEVOTHYROXINE 125 MCG TAB: 125 | 90 days supply | Qty: 96 | Fill #0

## 2019-08-22 MED FILL — ESCITALOPRAM 10 MG TABLET: 10 | 90 days supply | Qty: 90 | Fill #1

## 2019-09-12 MED FILL — LEVOTHYROXINE 125 MCG TAB: 125 | 90 days supply | Qty: 96 | Fill #1

## 2019-11-21 ENCOUNTER — Telehealth: Payer: Self-pay | Admitting: Family Medicine

## 2019-11-21 ENCOUNTER — Other Ambulatory Visit: Payer: Self-pay | Admitting: Family Medicine

## 2019-11-21 DIAGNOSIS — F411 Generalized anxiety disorder: Secondary | ICD-10-CM

## 2019-11-21 MED ORDER — ESCITALOPRAM OXALATE 10 MG PO TABS: 10.0000 mg | ORAL_TABLET | Freq: Every day | ORAL | 0 refills | Status: DC

## 2019-11-21 MED FILL — ESCITALOPRAM 10 MG TABLET: 10 | 30 days supply | Qty: 30 | Fill #0

## 2019-11-21 NOTE — Telephone Encounter (Signed)
  LAST APPOINTMENT DATE: 11/05/18  NEXT APPOINTMENT DATE:@2 /22/2021  MEDICATION:lexapro citalopram  PHARMACY:[facility]  **Let patient know to contact pharmacy at the end of the day to make sure medication is ready. **  ** Please notify patient to allow 48-72 hours to process**  **Encourage patient to contact the pharmacy for refills or they can request refills through [facility]**  CLINICAL FILLS OUT ALL BELOW:   LAST REFILL:  QTY:  REFILL DATE:    OTHER COMMENTS:    Okay for refill?  Please advise

## 2019-11-21 NOTE — Telephone Encounter (Signed)
Left message on voicemail Rx requested was sent to pharmacy. Any questions call back.

## 2019-11-28 ENCOUNTER — Other Ambulatory Visit: Payer: Self-pay

## 2019-11-28 ENCOUNTER — Encounter: Payer: Self-pay | Admitting: Physician Assistant

## 2019-11-28 ENCOUNTER — Ambulatory Visit (INDEPENDENT_AMBULATORY_CARE_PROVIDER_SITE_OTHER): Payer: 59 | Admitting: Physician Assistant

## 2019-11-28 VITALS — BP 120/72 | HR 78 | Temp 97.2°F | Ht 64.5 in | Wt 260.2 lb

## 2019-11-28 DIAGNOSIS — F411 Generalized anxiety disorder: Secondary | ICD-10-CM | POA: Diagnosis not present

## 2019-11-28 DIAGNOSIS — Z0000 Encounter for general adult medical examination without abnormal findings: Secondary | ICD-10-CM | POA: Diagnosis not present

## 2019-11-28 DIAGNOSIS — E559 Vitamin D deficiency, unspecified: Secondary | ICD-10-CM

## 2019-11-28 DIAGNOSIS — Z136 Encounter for screening for cardiovascular disorders: Secondary | ICD-10-CM | POA: Diagnosis not present

## 2019-11-28 DIAGNOSIS — E669 Obesity, unspecified: Secondary | ICD-10-CM

## 2019-11-28 DIAGNOSIS — L282 Other prurigo: Secondary | ICD-10-CM

## 2019-11-28 DIAGNOSIS — R61 Generalized hyperhidrosis: Secondary | ICD-10-CM | POA: Diagnosis not present

## 2019-11-28 DIAGNOSIS — Z13220 Encounter for screening for lipoid disorders: Secondary | ICD-10-CM | POA: Diagnosis not present

## 2019-11-28 LAB — CBC WITH DIFFERENTIAL/PLATELET
Basophils Absolute: 0.1 10*3/uL (ref 0.0–0.1)
Basophils Relative: 0.9 % (ref 0.0–3.0)
Eosinophils Absolute: 0.2 10*3/uL (ref 0.0–0.7)
Eosinophils Relative: 2.2 % (ref 0.0–5.0)
HCT: 37.1 % (ref 36.0–46.0)
Hemoglobin: 12.2 g/dL (ref 12.0–15.0)
Lymphocytes Relative: 29.1 % (ref 12.0–46.0)
Lymphs Abs: 2 10*3/uL (ref 0.7–4.0)
MCHC: 33 g/dL (ref 30.0–36.0)
MCV: 88.1 fl (ref 78.0–100.0)
Monocytes Absolute: 0.5 10*3/uL (ref 0.1–1.0)
Monocytes Relative: 7.4 % (ref 3.0–12.0)
Neutro Abs: 4.1 10*3/uL (ref 1.4–7.7)
Neutrophils Relative %: 60.4 % (ref 43.0–77.0)
Platelets: 287 10*3/uL (ref 150.0–400.0)
RBC: 4.21 Mil/uL (ref 3.87–5.11)
RDW: 14.6 % (ref 11.5–15.5)
WBC: 6.8 10*3/uL (ref 4.0–10.5)

## 2019-11-28 LAB — VITAMIN D 25 HYDROXY (VIT D DEFICIENCY, FRACTURES): VITD: 31.97 ng/mL (ref 30.00–100.00)

## 2019-11-28 MED ORDER — ESCITALOPRAM OXALATE 10 MG PO TABS: 10.0000 mg | ORAL_TABLET | Freq: Every day | ORAL | 2 refills | Status: DC

## 2019-11-28 MED ORDER — EPINEPHRINE 0.3 MG/0.3ML IJ SOAJ: INTRAMUSCULAR | 1 refills | Status: DC

## 2019-11-28 NOTE — Progress Notes (Signed)
I acted as a Education administrator for [employer], PA-C Anselmo Pickler, LPN  Subjective:    Lisa Bolton is a 34 y.o. female and is here for a comprehensive physical exam.  HPI  Pt is here for transfer of care from [name].   There are no preventive care reminders to display for this patient.  Acute Concerns: Night sweats -- reports that she has been feeling very hot at night for the past few weeks. Does correlate with getting a new mattress, she is unsure if it is related to that. Is due for her thyroid recheck in a month or two with her endocrinologist. Reports compliance with this medication. Denies: unintentional weight loss, unexplained fevers, unusual LAD or abdominal pain.  Chronic Issues: GAD -- feels like the Lexapro 10 mg is working well for her; denies SI/HI Anemia -- takes MVI regularly with iron; no additional iron supplement; denies: palpitations, dizziness, prior blood transfusion Allergy to shellfish -- has sensitive allergy to shellfish; needs updated epi-pen; no prior anaphylaxis per her report  Health Maintenance: Immunizations -- UTD Colonoscopy -- N/A Mammogram -- N/A PAP -- UTD, due 07/2021 Bone Density -- N/A Diet -- has done [employer] in the past; tries to eat healthy (avoids junk foods) Sleep habits -- no significant issues Exercise -- minimal since working at home Current Weight -- Weight: 260 lb 4 oz (118 kg)  Weight History: Wt Readings from Last 10 Encounters:  11/28/19 260 lb 4 oz (118 kg)  11/05/18 266 lb (120.7 kg)  07/16/18 257 lb 3.2 oz (116.7 kg)  07/07/18 262 lb 12.8 oz (119.2 kg)  06/16/18 261 lb (118.4 kg)  07/16/17 278 lb 9.6 oz (126.4 kg)  11/12/16 280 lb (127 kg)  06/14/13 261 lb (118.4 kg)  12/15/12 258 lb 9.6 oz (117.3 kg)  12/14/12 256 lb (116.1 kg)   Mood -- overall good Patient's last menstrual period was 11/05/2019. Period characteristics -- regular, q 28 days   Depression screen [facility] 2/9 11/28/2019  Decreased Interest 0    Down, Depressed, Hopeless 0  PHQ - 2 Score 0  Altered sleeping 0  Tired, decreased energy 0  Change in appetite 0  Feeling bad or failure about yourself  0  Trouble concentrating 0  Moving slowly or fidgety/restless 0  Suicidal thoughts 0  PHQ-9 Score 0  Difficult doing work/chores Not difficult at all     Other providers/specialists: Patient Care Team: Inda Coke, [place] as PCP - General (Physician Assistant) Reynold Bowen, MD as Consulting Physician (Endocrinology)   PMHx, SurgHx, SocialHx, Medications, and Allergies were reviewed in the Visit Navigator and updated as appropriate.   Past Medical History:  Diagnosis Date  . Anemia   . Chronic vaginitis   . Family history of anesthesia complication    UNCLE WITH  PSUEDOCHOLINESTEROSE  DEFIENCY  . GERD (gastroesophageal reflux disease)   . Malignant neoplasm of thyroid gland ([place]) 10/11/2012  . Panic attacks   . Recurrent boils, vulva      Past Surgical History:  Procedure Laterality Date  . ANTERIOR CRUCIATE LIGAMENT REPAIR Left 2001 - approximate  . LYMPH NODE DISSECTION  10/29/2012   Procedure: LYMPH NODE DISSECTION;  Surgeon: Adin Hector, MD;  Location: [facility];  Service: General;  Laterality: N/A;  central compartment lymph node dissection   . THYROID LOBECTOMY  09/21/2012   Procedure: THYROID LOBECTOMY;  Surgeon: Adin Hector, MD;  Location: [facility];  Service: General;  Laterality: Right;  Right Thyroid  Lobectomy  . THYROID LOBECTOMY  10/29/2012   Procedure: THYROID LOBECTOMY;  Surgeon: Adin Hector, MD;  Location: [facility];  Service: General;  Laterality: Left;  left thyroid lobectomy,central compartmental lymph node dissection     Family History  Problem Relation Age of Onset  . Hypertension Mother   . Heart attack Mother   . Thyroid cancer Mother   . Multiple sclerosis Mother   . Anxiety disorder Brother   . Diabetes type I Brother   . Arthritis Maternal Grandmother   . Heart disease Paternal  Grandmother   . Parkinson's disease Paternal Grandmother   . Heart attack Paternal Grandfather   . Allergic rhinitis Neg Hx   . Angioedema Neg Hx   . Asthma Neg Hx   . Eczema Neg Hx   . Colon cancer Neg Hx   . Breast cancer Neg Hx     Social History   Tobacco Use  . Smoking status: Never Smoker  . Smokeless tobacco: Never Used  Substance Use Topics  . Alcohol use: Yes    Alcohol/week: 4.0 standard drinks    Types: 2 Glasses of wine, 2 Cans of beer per week    Comment: socially  . Drug use: No    Review of Systems:   Review of Systems  Constitutional: Positive for diaphoresis. Negative for chills, fever, malaise/fatigue and weight loss.  HENT: Negative for hearing loss, sinus pain and sore throat.   Respiratory: Negative for cough and hemoptysis.   Cardiovascular: Negative for chest pain, palpitations, leg swelling and PND.  Gastrointestinal: Negative for abdominal pain, constipation, diarrhea, heartburn, nausea and vomiting.  Genitourinary: Negative for dysuria, frequency and urgency.  Musculoskeletal: Negative for back pain, myalgias and neck pain.  Skin: Negative for itching and rash.  Neurological: Negative for dizziness, tingling, seizures and headaches.  Endo/Heme/Allergies: Negative for polydipsia.  Psychiatric/Behavioral: Negative for depression. The patient is not nervous/anxious.     Objective:   BP 120/72 (BP Location: Left Arm, Patient Position: Sitting, Cuff Size: Large)   Pulse 78   Temp (!) 97.2 F (36.2 C) (Temporal)   Ht 5' 4.5" (1.638 m)   Wt 260 lb 4 oz (118 kg)   LMP 11/05/2019   SpO2 98%   BMI 43.98 kg/m   General Appearance:    Alert, cooperative, no distress, appears stated age  Head:    Normocephalic, without obvious abnormality, atraumatic  Eyes:    PERRL, conjunctiva/corneas clear, EOM's intact, fundi    benign, both eyes  Ears:    Normal TM's and external ear canals, both ears  Nose:   Nares normal, septum midline, mucosa normal, no  drainage    or sinus tenderness  Throat:   Lips, mucosa, and tongue normal; teeth and gums normal  Neck:   Supple, symmetrical, trachea midline, no adenopathy;    thyroid:  no enlargement/tenderness/nodules; no carotid   bruit or JVD  Back:     Symmetric, no curvature, ROM normal, no CVA tenderness  Lungs:     Clear to auscultation bilaterally, respirations unlabored  Chest Wall:    No tenderness or deformity   Heart:    Regular rate and rhythm, S1 and S2 normal, no murmur, rub   or gallop  Breast Exam:    Deferred  Abdomen:     Soft, non-tender, bowel sounds active all four quadrants,    no masses, no organomegaly  Genitalia:    Deferred  Rectal:    Deferred  Extremities:  Extremities normal, atraumatic, no cyanosis or edema  Pulses:   2+ and symmetric all extremities  Skin:   Skin color, texture, turgor normal, no rashes or lesions  Lymph nodes:   Cervical, supraclavicular, and axillary nodes normal  Neurologic:   CNII-XII intact, normal strength, sensation and reflexes    throughout     Assessment/Plan:   Lisa Bolton was seen today for transfer of care and annual exam.  Diagnoses and all orders for this visit:  Routine physical examination Today patient counseled on age appropriate routine health concerns for screening and prevention, each reviewed and up to date or declined. Immunizations reviewed and up to date or declined. Labs ordered and reviewed. Risk factors for depression reviewed and negative. Hearing function and visual acuity are intact. ADLs screened and addressed as needed. Functional ability and level of safety reviewed and appropriate. Education, counseling and referrals performed based on assessed risks today. Patient provided with a copy of personalized plan for preventive services.  GAD (generalized anxiety disorder) Currently well controlled on Lexapro 10 mg daily. Follow-up in 6 months, sooner if concerns. -     CBC with Differential/Platelet -     Comprehensive  metabolic panel  Encounter for lipid screening for cardiovascular disease -     Lipid panel  Vitamin D deficiency -     VITAMIN D 25 Hydroxy (Vit-D Deficiency, Fractures)  Papular urticare -     EPINEPHrine 0.3 mg/0.3 mL IJ SOAJ injection; INJECT AS DIRECTED FOR SEVERE ALLERGIC REACTION  Obesity Recommended getting back into physical activity as able.  Night sweat Recommend follow-up with endocrinology to assess level of TSH control. If symptoms persist after this, or has any new or unusual symptoms, she was told to follow back up with [place]. No red flags on discussion.   Well Adult Exam: Labs ordered: Yes. Patient counseling was done. See below for items discussed. Discussed the patient's BMI. The BMI is not in the acceptable range; BMI management plan is completed Follow up in 6 months.  Patient Counseling:   [x]     Nutrition: Stressed importance of moderation in sodium/caffeine intake, saturated fat and cholesterol, caloric balance, sufficient intake of fresh fruits, vegetables, fiber, calcium, iron, and 1 mg of folate supplement per day (for females capable of pregnancy).   [x]      Stressed the importance of regular exercise.    [x]     Substance Abuse: Discussed cessation/primary prevention of tobacco, alcohol, or other drug use; driving or other dangerous activities under the influence; availability of treatment for abuse.    [x]      Injury prevention: Discussed safety belts, safety helmets, smoke detector, smoking near bedding or upholstery.    [x]      Sexuality: Discussed sexually transmitted diseases, partner selection, use of condoms, avoidance of unintended pregnancy  and contraceptive alternatives.    [x]     Dental health: Discussed importance of regular tooth brushing, flossing, and dental visits.   [x]      Health maintenance and immunizations reviewed. Please refer to Health maintenance section.   CMA or LPN served as scribe during this visit. History, Physical,  and Plan performed by medical provider. The above documentation has been reviewed and is accurate and complete.  Inda Coke, PA-C [place]

## 2019-11-28 NOTE — Patient Instructions (Signed)
It was great to see you!  Please go to the lab for blood work.   Our office will call you with your results unless you have chosen to receive results via MyChart.  If your blood work is normal we will follow-up each year for physicals and as scheduled for chronic medical problems.  If anything is abnormal we will treat accordingly and get you in for a follow-up.  Take care,  Lisa Bolton    Health Maintenance, Female Adopting a healthy lifestyle and getting preventive care are important in promoting health and wellness. Ask your health care provider about:  The right schedule for you to have regular tests and exams.  Things you can do on your own to prevent diseases and keep yourself healthy. What should I know about diet, weight, and exercise? Eat a healthy diet   Eat a diet that includes plenty of vegetables, fruits, low-fat dairy products, and lean protein.  Do not eat a lot of foods that are high in solid fats, added sugars, or sodium. Maintain a healthy weight Body mass index (BMI) is used to identify weight problems. It estimates body fat based on height and weight. Your health care provider can help determine your BMI and help you achieve or maintain a healthy weight. Get regular exercise Get regular exercise. This is one of the most important things you can do for your health. Most adults should:  Exercise for at least 150 minutes each week. The exercise should increase your heart rate and make you sweat (moderate-intensity exercise).  Do strengthening exercises at least twice a week. This is in addition to the moderate-intensity exercise.  Spend less time sitting. Even light physical activity can be beneficial. Watch cholesterol and blood lipids Have your blood tested for lipids and cholesterol at 35 years of age, then have this test every 5 years. Have your cholesterol levels checked more often if:  Your lipid or cholesterol levels are high.  You are older than 35  years of age.  You are at high risk for heart disease. What should I know about cancer screening? Depending on your health history and family history, you may need to have cancer screening at various ages. This may include screening for:  Breast cancer.  Cervical cancer.  Colorectal cancer.  Skin cancer.  Lung cancer. What should I know about heart disease, diabetes, and high blood pressure? Blood pressure and heart disease  High blood pressure causes heart disease and increases the risk of stroke. This is more likely to develop in people who have high blood pressure readings, are of African descent, or are overweight.  Have your blood pressure checked: ? Every 3-5 years if you are 18-39 years of age. ? Every year if you are 40 years old or older. Diabetes Have regular diabetes screenings. This checks your fasting blood sugar level. Have the screening done:  Once every three years after age 40 if you are at a normal weight and have a low risk for diabetes.  More often and at a younger age if you are overweight or have a high risk for diabetes. What should I know about preventing infection? Hepatitis B If you have a higher risk for hepatitis B, you should be screened for this virus. Talk with your health care provider to find out if you are at risk for hepatitis B infection. Hepatitis C Testing is recommended for:  Everyone born from 1945 through 1965.  Anyone with known risk factors for hepatitis C. Sexually   transmitted infections (STIs)  Get screened for STIs, including gonorrhea and chlamydia, if: ? You are sexually active and are younger than 35 years of age. ? You are older than 35 years of age and your health care provider tells you that you are at risk for this type of infection. ? Your sexual activity has changed since you were last screened, and you are at increased risk for chlamydia or gonorrhea. Ask your health care provider if you are at risk.  Ask your health  care provider about whether you are at high risk for HIV. Your health care provider may recommend a prescription medicine to help prevent HIV infection. If you choose to take medicine to prevent HIV, you should first get tested for HIV. You should then be tested every 3 months for as long as you are taking the medicine. Pregnancy  If you are about to stop having your period (premenopausal) and you may become pregnant, seek counseling before you get pregnant.  Take 400 to 800 micrograms (mcg) of folic acid every day if you become pregnant.  Ask for birth control (contraception) if you want to prevent pregnancy. Osteoporosis and menopause Osteoporosis is a disease in which the bones lose minerals and strength with aging. This can result in bone fractures. If you are 65 years old or older, or if you are at risk for osteoporosis and fractures, ask your health care provider if you should:  Be screened for bone loss.  Take a calcium or vitamin D supplement to lower your risk of fractures.  Be given hormone replacement therapy (HRT) to treat symptoms of menopause. Follow these instructions at home: Lifestyle  Do not use any products that contain nicotine or tobacco, such as cigarettes, e-cigarettes, and chewing tobacco. If you need help quitting, ask your health care provider.  Do not use street drugs.  Do not share needles.  Ask your health care provider for help if you need support or information about quitting drugs. Alcohol use  Do not drink alcohol if: ? Your health care provider tells you not to drink. ? You are pregnant, may be pregnant, or are planning to become pregnant.  If you drink alcohol: ? Limit how much you use to 0-1 drink a day. ? Limit intake if you are breastfeeding.  Be aware of how much alcohol is in your drink. In the [place] one drink equals one 12 oz bottle of beer (355 mL), one 5 oz glass of wine (148 mL), or one 1 oz glass of hard liquor (44 mL). General  instructions  Schedule regular health, dental, and eye exams.  Stay current with your vaccines.  Tell your health care provider if: ? You often feel depressed. ? You have ever been abused or do not feel safe at home. Summary  Adopting a healthy lifestyle and getting preventive care are important in promoting health and wellness.  Follow your health care provider's instructions about healthy diet, exercising, and getting tested or screened for diseases.  Follow your health care provider's instructions on monitoring your cholesterol and blood pressure. This information is not intended to replace advice given to you by your health care provider. Make sure you discuss any questions you have with your health care provider. Document Revised: 09/15/2018 Document Reviewed: 09/15/2018 Elsevier Patient Education  2020 Elsevier Inc.  

## 2019-11-29 LAB — LIPID PANEL
Cholesterol: 189 mg/dL (ref 0–200)
HDL: 50.1 mg/dL (ref >39.00)
LDL Cholesterol: 119 mg/dL — ABNORMAL HIGH (ref 0–99)
NonHDL: 138.57
Total CHOL/HDL Ratio: 4
Triglycerides: 99 mg/dL (ref 0.0–149.0)
VLDL: 19.8 mg/dL (ref 0.0–40.0)

## 2019-11-29 LAB — COMPREHENSIVE METABOLIC PANEL
ALT: 15 U/L (ref 0–35)
AST: 12 U/L (ref 0–37)
Albumin: 4.1 g/dL (ref 3.5–5.2)
Alkaline Phosphatase: 95 U/L (ref 39–117)
BUN: 15 mg/dL (ref 6–23)
CO2: 26 mEq/L (ref 19–32)
Calcium: 9.2 mg/dL (ref 8.4–10.5)
Chloride: 105 mEq/L (ref 96–112)
Creatinine, Ser: 0.74 mg/dL (ref 0.40–1.20)
GFR: 89.34 mL/min (ref >60.00)
Glucose, Bld: 91 mg/dL (ref 70–99)
Potassium: 4.4 mEq/L (ref 3.5–5.1)
Sodium: 138 mEq/L (ref 135–145)
Total Bilirubin: 0.3 mg/dL (ref 0.2–1.2)
Total Protein: 6.9 g/dL (ref 6.0–8.3)

## 2019-12-16 MED FILL — LEVOTHYROXINE SODIUM 125 MC: 125 | 90 days supply | Qty: 96 | Fill #2

## 2019-12-16 MED FILL — ESCITALOPRAM 10 MG TABLET: 10 | 90 days supply | Qty: 90 | Fill #0

## 2020-02-13 DIAGNOSIS — D509 Iron deficiency anemia, unspecified: Secondary | ICD-10-CM | POA: Diagnosis not present

## 2020-02-13 DIAGNOSIS — C73 Malignant neoplasm of thyroid gland: Secondary | ICD-10-CM | POA: Diagnosis not present

## 2020-02-13 DIAGNOSIS — E559 Vitamin D deficiency, unspecified: Secondary | ICD-10-CM | POA: Diagnosis not present

## 2020-02-13 DIAGNOSIS — E890 Postprocedural hypothyroidism: Secondary | ICD-10-CM | POA: Diagnosis not present

## 2020-02-13 DIAGNOSIS — F419 Anxiety disorder, unspecified: Secondary | ICD-10-CM | POA: Diagnosis not present

## 2020-02-23 ENCOUNTER — Other Ambulatory Visit: Payer: Self-pay | Admitting: Physician Assistant

## 2020-02-23 DIAGNOSIS — F411 Generalized anxiety disorder: Secondary | ICD-10-CM

## 2020-02-23 MED FILL — EPINEPHRINE 0.3 MG AUTO-INJ: 0.3 | 30 days supply | Qty: 2 | Fill #0

## 2020-02-23 NOTE — Telephone Encounter (Signed)
Requesting refill on Clonazepam. Last OV 11/2019

## 2020-02-24 MED FILL — clonazePAM 0.5 MG TABS: 0.5 | 15 days supply | Qty: 30 | Fill #0

## 2020-03-19 MED FILL — ESCITALOPRAM 10 MG TABLET: 10 | 90 days supply | Qty: 90 | Fill #1

## 2020-03-19 MED FILL — LEVOTHYROXINE SODIUM 125 MC: 125 | 90 days supply | Qty: 96 | Fill #0

## 2020-05-04 DIAGNOSIS — E890 Postprocedural hypothyroidism: Secondary | ICD-10-CM | POA: Diagnosis not present

## 2020-05-24 MED FILL — LEVOTHYROXINE 137 MCG TAB: 137 | 90 days supply | Qty: 90 | Fill #0

## 2020-06-06 ENCOUNTER — Other Ambulatory Visit: Payer: Self-pay

## 2020-06-06 ENCOUNTER — Other Ambulatory Visit: Payer: Self-pay | Admitting: Critical Care Medicine

## 2020-06-06 DIAGNOSIS — Z20822 Contact with and (suspected) exposure to covid-19: Secondary | ICD-10-CM | POA: Diagnosis not present

## 2020-06-08 LAB — NOVEL CORONAVIRUS, NAA: SARS-CoV-2, NAA: NOT DETECTED

## 2020-06-12 MED FILL — LEVOTHYROXINE SODIUM 125 MC: 125 | 90 days supply | Qty: 96 | Fill #1

## 2020-06-13 ENCOUNTER — Ambulatory Visit (INDEPENDENT_AMBULATORY_CARE_PROVIDER_SITE_OTHER): Payer: 59

## 2020-06-13 ENCOUNTER — Ambulatory Visit (INDEPENDENT_AMBULATORY_CARE_PROVIDER_SITE_OTHER): Payer: 59 | Admitting: Family Medicine

## 2020-06-13 ENCOUNTER — Other Ambulatory Visit: Payer: Self-pay

## 2020-06-13 ENCOUNTER — Encounter: Payer: Self-pay | Admitting: Family Medicine

## 2020-06-13 VITALS — BP 110/62 | HR 89 | Ht 64.5 in | Wt 281.0 lb

## 2020-06-13 DIAGNOSIS — M545 Low back pain, unspecified: Secondary | ICD-10-CM

## 2020-06-13 DIAGNOSIS — M24551 Contracture, right hip: Secondary | ICD-10-CM | POA: Diagnosis not present

## 2020-06-13 DIAGNOSIS — Z20828 Contact with and (suspected) exposure to other viral communicable diseases: Secondary | ICD-10-CM | POA: Diagnosis not present

## 2020-06-13 MED ORDER — MELOXICAM 7.5 MG PO TABS: 7.5000 mg | ORAL_TABLET | Freq: Every day | ORAL | 0 refills | Status: DC

## 2020-06-13 MED FILL — MELOXICAM 7.5 MG TABLET: 7.5 | 30 days supply | Qty: 30 | Fill #0

## 2020-06-13 NOTE — Patient Instructions (Addendum)
SI joint exercises  Meloxicam for 10 days then as needed Tart cherry 1200 mg at night  Take vitamin D Ok to run but do exercises after G2 or gatorade with hydration for working out See me again in 5-6 weeks we will consider manipulation

## 2020-06-13 NOTE — Assessment & Plan Note (Signed)
Hip flexor tightness.  Patient does have hypothyroidism status post thyroidectomy.  Concern for potentially some mild dehydration discussed with patient detail, discussed with meloxicam for breakthrough pain.  Warned of potential side effects.  X-rays ordered today for baseline but likely will be unremarkable.  Patient given home exercises and work with Product/process development scientist.  Patient will follow up with 5 to 6 weeks and could be candidate for possible osteopathic manipulation

## 2020-06-13 NOTE — Progress Notes (Signed)
Lisa Lisa Bolton Phone: [phone] Subjective:   Lisa Lisa Bolton, am serving as a scribe for Dr. Hulan Saas. This visit occurred during the Lisa Lisa Bolton public health emergency.  Safety protocols were in place, including screening questions prior to the visit, additional usage of staff PPE, and extensive cleaning of exam room while observing appropriate contact time as indicated for disinfecting solutions.   I'm seeing this patient by the request  of:  Lisa Lisa Bolton, [place]  CC: Low back pain  TDV:[id number]  Lisa Lisa Bolton is a 34 y.o. female coming in with complaint of back pain. Patient was training her new dog at beginning of year which required her to bend over alot. Patient states that her back pain is on both sides of lower back. Denies any radiating symptoms. Uses IBU prn. Is using high dose synthroid post-thyroid cancer and wonders if this may be contributing to her pain.  Rates the severity of pain is a 10     Past Medical History:  Diagnosis Date  . Anemia   . Chronic vaginitis   . Family history of anesthesia complication    UNCLE WITH  PSUEDOCHOLINESTEROSE  DEFIENCY  . GERD (gastroesophageal reflux disease)   . Malignant neoplasm of thyroid gland ([place]) 10/11/2012  . Panic attacks   . Recurrent boils, vulva    Past Surgical History:  Procedure Laterality Date  . ANTERIOR CRUCIATE LIGAMENT REPAIR Left 2001 - approximate  . LYMPH NODE DISSECTION  10/29/2012   Procedure: LYMPH NODE DISSECTION;  Surgeon: Adin Hector, MD;  Location: [facility];  Service: General;  Laterality: N/A;  central compartment lymph node dissection   . THYROID LOBECTOMY  09/21/2012   Procedure: THYROID LOBECTOMY;  Surgeon: Adin Hector, MD;  Location: [facility];  Service: General;  Laterality: Right;  Right Thyroid Lobectomy  . THYROID LOBECTOMY  10/29/2012   Procedure: THYROID LOBECTOMY;  Surgeon: Adin Hector, MD;  Location: [facility];  Service: General;  Laterality: Left;  left thyroid lobectomy,central compartmental lymph node dissection   Social History   Socioeconomic History  . Marital status: Single    Spouse name: Not on file  . Number of children: Not on file  . Years of education: Not on file  . Highest education level: Not on file  Occupational History  . Occupation: ADMISTRATIVE     Employer: [employer]  Tobacco Use  . Smoking status: Never Smoker  . Smokeless tobacco: Never Used  Vaping Use  . Vaping Use: Never used  Substance and Sexual Activity  . Alcohol use: Yes    Alcohol/week: 4.0 standard drinks    Types: 2 Glasses of wine, 2 Cans of beer per week    Comment: socially  . Drug use: Lisa Bolton  . Sexual activity: Not Currently    Partners: Male    Birth control/protection: Abstinence  Other Topics Concern  . 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 Difficulty of Paying Living Expenses: Not on file  Food Insecurity:   . Worried About Charity fundraiser in the Last Year: Not on file  . Ran Out of Food in the Last Year: Not on file  Transportation Needs:   . Lack of Transportation (Medical): Not on file  .  Lack of Transportation (Non-Medical): Not on file  Physical Activity:   . Days of Exercise per Week: Not on file  . Minutes of Exercise per Session: Not on file  Stress:   . Feeling of Stress : Not on file  Social Connections:   . Frequency of Communication with Friends and Family: Not on file  . Frequency of Social Gatherings with Friends and Family: Not on file  . Attends Religious Services: Not on file  . Active Member of Clubs or Organizations: Not on file  . Attends Archivist Meetings: Not on file  . Marital Status: Not on file   Allergies  Allergen Reactions  . Shellfish Allergy   . Other Other (See Comments)    Anethesia   . Pertussis Vaccines    Family History  Problem Relation Age of Onset  . Hypertension Mother   . Heart attack Mother   . Thyroid cancer Mother   . Multiple sclerosis Mother   . Anxiety disorder Brother   . Diabetes type I Brother   . Arthritis Maternal Grandmother   . Heart disease Paternal Grandmother   . Parkinson's disease Paternal Grandmother   . Heart attack Paternal Grandfather   . Allergic rhinitis Neg Hx   . Angioedema Neg Hx   . Asthma Neg Hx   . Eczema Neg Hx   . Colon cancer Neg Hx   . Breast cancer Neg Hx     Current Outpatient Medications (Endocrine & Metabolic):  .  levothyroxine (SYNTHROID, LEVOTHROID) 125 MCG tablet, Take 125 mcg by mouth daily before breakfast.  Current Outpatient Medications (Cardiovascular):  Marland Kitchen  EPINEPHrine 0.3 mg/0.3 mL IJ SOAJ injection, INJECT AS DIRECTED FOR SEVERE ALLERGIC REACTION  Current Outpatient Medications (Respiratory):  .  loratadine (CLARITIN) 10 MG tablet, Take 10 mg by mouth daily.  Current Outpatient Medications (Analgesics):  .  meloxicam (MOBIC) 7.5 MG tablet, Take 1 tablet (7.5 mg total) by mouth daily.   Current Outpatient Medications (Other):  .  clonazePAM (KLONOPIN) 0.5 MG tablet, TAKE 1 TABLET BY MOUTH TWICE DAILY AS NEEDED FOR ANXIETY .  escitalopram (LEXAPRO) 10 MG tablet, Take 1 tablet (10 mg total) by mouth daily. .  Probiotic Product (PROBIOTIC-10) CAPS, Take by mouth.   Reviewed prior external information including notes and imaging from  primary care provider As well as notes that were available from care everywhere and other healthcare systems.  Past medical history, social, surgical and family history all reviewed in electronic medical record.  Lisa Bolton pertanent information unless stated regarding to the chief complaint.   Review of Systems:  Lisa Bolton headache, visual changes, nausea, vomiting, diarrhea, constipation, dizziness, abdominal pain, skin rash, fevers, chills, night sweats, weight loss, swollen  lymph nodes,  joint swelling, chest pain, shortness of breath, mood changes. POSITIVE muscle aches, body aches  Objective  Blood pressure 110/62, pulse 89, height 5' 4.5" (1.638 m), weight 281 lb (127.5 kg), last menstrual period 05/16/2020, SpO2 98 %.   General: Lisa Bolton apparent distress alert and oriented x3 mood and affect normal, dressed appropriately.  HEENT: Pupils equal, extraocular movements intact  Respiratory: Patient's speak in full sentences and does not appear short of breath  Cardiovascular: Lisa Bolton lower extremity edema, non tender, Lisa Bolton erythema  Neuro: Cranial nerves II through XII are intact, neurovascularly intact in all extremities with 2+ DTRs and 2+ pulses.  Gait normal with good balance and coordination.  MSK:  Non tender with full range of motion and good stability and symmetric  strength and tone of shoulders, elbows, wrist, hip, knee and ankles bilaterally.  Back exam does show some mild loss, negative straight leg test tightness with FABER test.  Tightness noted at the thoracolumbar junction as well as tightness of the right hip flexors noted.  Patient does localize 10 degrees of flexion in the last 5 degrees of extension   [id number]; 15 additional minutes spent for Therapeutic exercises as stated in above notes.  This included exercises focusing on stretching, strengthening, with significant focus on eccentric aspects.   Long term goals include an improvement in range of motion, strength, endurance as well as avoiding reinjury. Patient's frequency would include in 1-2 times a day, 3-5 times a week for a duration of 6-12 weeks. Low back exercises that included:  Pelvic tilt/bracing instruction to focus on control of the pelvic girdle and lower abdominal muscles  Glute strengthening exercises, focusing on proper firing of the glutes without engaging the low back muscles Proper stretching techniques for maximum relief for the hamstrings, hip flexors, low back and some rotation where  tolerated    Proper technique shown and discussed handout in great detail with ATC.  All questions were discussed and answered. \   Impression and Recommendations:     The above documentation has been reviewed and is accurate and complete Lisa Pulley, DO       Note: This dictation was prepared with Dragon dictation along with smaller phrase technology. Any transcriptional errors that result from this process are unintentional.

## 2020-06-14 ENCOUNTER — Encounter: Payer: Self-pay | Admitting: Family Medicine

## 2020-06-14 ENCOUNTER — Telehealth (INDEPENDENT_AMBULATORY_CARE_PROVIDER_SITE_OTHER): Payer: 59 | Admitting: Family Medicine

## 2020-06-14 VITALS — BP 110/60 | Wt 280.0 lb

## 2020-06-14 DIAGNOSIS — R0981 Nasal congestion: Secondary | ICD-10-CM

## 2020-06-14 DIAGNOSIS — R05 Cough: Secondary | ICD-10-CM

## 2020-06-14 DIAGNOSIS — J309 Allergic rhinitis, unspecified: Secondary | ICD-10-CM

## 2020-06-14 DIAGNOSIS — R059 Cough, unspecified: Secondary | ICD-10-CM

## 2020-06-14 MED ORDER — AMOXICILLIN-POT CLAVULANATE 875-125 MG PO TABS: 1.0000 | ORAL_TABLET | Freq: Two times a day (BID) | ORAL | 0 refills | Status: DC

## 2020-06-14 MED ORDER — BENZONATATE 100 MG PO CAPS: 100.0000 mg | ORAL_CAPSULE | Freq: Three times a day (TID) | ORAL | 0 refills | Status: DC | PRN

## 2020-06-14 MED FILL — BENZONATATE 100 MG CAPS: 100 | 7 days supply | Qty: 20 | Fill #0

## 2020-06-14 NOTE — Progress Notes (Signed)
Virtual Visit via Video Note  I connected with Lisa Bolton  on 06/14/20 at  5:20 PM EDT by a video enabled telemedicine application and verified that I am speaking with the correct person using two identifiers.  Location patient: home Location provider:work or home office Persons participating in the virtual visit: patient, provider  I discussed the limitations of evaluation and management by telemedicine and the availability of in person appointments. The patient expressed understanding and agreed to proceed.   HPI:  Acute visit for a resp illness: -started about 2 weeks ago -symptoms include sinus congestion, had some diarrhea, PND, cough - deep at times, sore throat, mucus is discolored -denies SOB, CP, body aches, fevers, NVD, loss of taste or smell, HAs -had a negative covid test last week and retested last night - waiting on results - no known sick contacts -she is fully vaccinated for COVID19 with pfizer  -has these symptoms frequently this time of the year with ragweed allergies - she started her antihistamine and flonase back up, but things have gotten worse instead of better -denies any history of asthma -denies chance of pregnancy  ROS: See pertinent positives and negatives per HPI.  Past Medical History:  Diagnosis Date  . Anemia   . Chronic vaginitis   . Family history of anesthesia complication    UNCLE WITH  PSUEDOCHOLINESTEROSE  DEFIENCY  . GERD (gastroesophageal reflux disease)   . Malignant neoplasm of thyroid gland ([facility]) 10/11/2012  . Panic attacks   . Recurrent boils, vulva     Past Surgical History:  Procedure Laterality Date  . ANTERIOR CRUCIATE LIGAMENT REPAIR Left 2001 - approximate  . LYMPH NODE DISSECTION  10/29/2012   Procedure: LYMPH NODE DISSECTION;  Surgeon: Adin Hector, MD;  Location: [facility];  Service: General;  Laterality: N/A;  central compartment lymph node dissection   . THYROID LOBECTOMY  09/21/2012   Procedure: THYROID LOBECTOMY;  Surgeon:  Adin Hector, MD;  Location: [facility];  Service: General;  Laterality: Right;  Right Thyroid Lobectomy  . THYROID LOBECTOMY  10/29/2012   Procedure: THYROID LOBECTOMY;  Surgeon: Adin Hector, MD;  Location: [facility];  Service: General;  Laterality: Left;  left thyroid lobectomy,central compartmental lymph node dissection    Family History  Problem Relation Age of Onset  . Hypertension Mother   . Heart attack Mother   . Thyroid cancer Mother   . Multiple sclerosis Mother   . Anxiety disorder Brother   . Diabetes type I Brother   . Arthritis Maternal Grandmother   . Heart disease Paternal Grandmother   . Parkinson's disease Paternal Grandmother   . Heart attack Paternal Grandfather   . Allergic rhinitis Neg Hx   . Angioedema Neg Hx   . Asthma Neg Hx   . Eczema Neg Hx   . Colon cancer Neg Hx   . Breast cancer Neg Hx     SOCIAL HX: see hpi   Current Outpatient Medications:  .  clonazePAM (KLONOPIN) 0.5 MG tablet, TAKE 1 TABLET BY MOUTH TWICE DAILY AS NEEDED FOR ANXIETY, Disp: 30 tablet, Rfl: 1 .  EPINEPHrine 0.3 mg/0.3 mL IJ SOAJ injection, INJECT AS DIRECTED FOR SEVERE ALLERGIC REACTION, Disp: 2 each, Rfl: 1 .  escitalopram (LEXAPRO) 10 MG tablet, Take 1 tablet (10 mg total) by mouth daily., Disp: 90 tablet, Rfl: 2 .  levothyroxine (SYNTHROID) 137 MCG tablet, Take 137 mcg by mouth daily., Disp: , Rfl:  .  loratadine (CLARITIN) 10 MG tablet, Take  10 mg by mouth daily., Disp: , Rfl:  .  meloxicam (MOBIC) 7.5 MG tablet, Take 1 tablet (7.5 mg total) by mouth daily., Disp: 30 tablet, Rfl: 0 .  Probiotic Product (PROBIOTIC-10) CAPS, Take by mouth., Disp: , Rfl:  .  amoxicillin-clavulanate (AUGMENTIN) 875-125 MG tablet, Take 1 tablet by mouth 2 (two) times daily., Disp: 20 tablet, Rfl: 0 .  benzonatate (TESSALON PERLES) 100 MG capsule, Take 1 capsule (100 mg total) by mouth 3 (three) times daily as needed., Disp: 20 capsule, Rfl: 0  EXAM:  VITALS per patient if  applicable:  GENERAL: alert, oriented, appears well and in no acute distress  HEENT: atraumatic, conjunttiva clear, no obvious abnormalities on inspection of external nose and ears  NECK: normal movements of the head and neck  LUNGS: on inspection no signs of respiratory distress, breathing rate appears normal, no obvious gross SOB, gasping or wheezing, coughing intermittently during visit  CV: no obvious cyanosis  MS: moves all visible extremities without noticeable abnormality  PSYCH/NEURO: pleasant and cooperative, no obvious depression or anxiety, speech and thought processing grossly intact  ASSESSMENT AND PLAN:  Discussed the following assessment and plan:  Sinus congestion  Cough  Allergic rhinitis, unspecified seasonality, unspecified trigger  -we discussed possible serious and likely etiologies, options for evaluation and workup, limitations of telemedicine visit vs in person visit, treatment, treatment risks and precautions. Pt prefers to treat via telemedicine empirically rather then risking or undertaking an in person visit at this moment. Query viral illness now with developing 2ndary sinusitis or bacterial resp infection given duration of symptoms, worsening and discolored mucus. She has a 2nd covid test pending and is vaccinated for covid. Sent rx for Augmentin 875 bid x10 days and tessalon for cough. Continue antihistamine and flonase throughout the allergy season.  Work/School slipped offered:declined Advised to seek prompt follow up telemedicine visit or in person care if worsening, new symptoms arise, or if is not improving with treatment.  I discussed the assessment and treatment plan with the patient. The patient was provided an opportunity to ask questions and all were answered. The patient agreed with the plan and demonstrated an understanding of the instructions.     Lucretia Kern, DO

## 2020-06-14 NOTE — Patient Instructions (Signed)
-  I sent the medication(s) we discussed to your pharmacy: Meds ordered this encounter  Medications  . amoxicillin-clavulanate (AUGMENTIN) 875-125 MG tablet    Sig: Take 1 tablet by mouth 2 (two) times daily.    Dispense:  20 tablet    Refill:  0  . benzonatate (TESSALON PERLES) 100 MG capsule    Sig: Take 1 capsule (100 mg total) by mouth 3 (three) times daily as needed.    Dispense:  20 capsule    Refill:  0   Continue allergy regimen  I hope you are feeling better soon! Seek care promptly if your symptoms worsen, new concerns arise or you are not improving with treatment.

## 2020-06-15 ENCOUNTER — Other Ambulatory Visit: Payer: Self-pay | Admitting: Physician Assistant

## 2020-06-15 MED ORDER — DOXYCYCLINE HYCLATE 100 MG PO TABS: 100.0000 mg | ORAL_TABLET | Freq: Two times a day (BID) | ORAL | 0 refills | Status: DC

## 2020-06-15 MED FILL — DOXYCYCLINE HYCLATE 100 MG: 100 | 10 days supply | Qty: 20 | Fill #0

## 2020-06-21 ENCOUNTER — Telehealth: Payer: Self-pay | Admitting: Physician Assistant

## 2020-06-21 DIAGNOSIS — F411 Generalized anxiety disorder: Secondary | ICD-10-CM

## 2020-06-21 MED ORDER — ESCITALOPRAM OXALATE 10 MG PO TABS: 10.0000 mg | ORAL_TABLET | Freq: Every day | ORAL | 1 refills | Status: DC

## 2020-06-21 NOTE — Telephone Encounter (Signed)
  LAST APPOINTMENT DATE: 02/23/2020   NEXT APPOINTMENT DATE:@2 /23/2022  MEDICATION: escitalopram (LEXAPRO) 10 MG tablet  PHARMACY: [facility] - Address: [address] [place], [place] [address]-   COMMENTS: Patient is currently out of town and asked if the refill could be sent here.

## 2020-06-21 NOTE — Telephone Encounter (Signed)
Left message on voicemail Rx sent to pharmacy as requested. 

## 2020-07-22 DIAGNOSIS — Z23 Encounter for immunization: Secondary | ICD-10-CM | POA: Diagnosis not present

## 2020-07-23 ENCOUNTER — Ambulatory Visit (INDEPENDENT_AMBULATORY_CARE_PROVIDER_SITE_OTHER): Payer: 59 | Admitting: Family Medicine

## 2020-07-23 ENCOUNTER — Encounter: Payer: Self-pay | Admitting: Family Medicine

## 2020-07-23 ENCOUNTER — Other Ambulatory Visit: Payer: Self-pay

## 2020-07-23 DIAGNOSIS — M24551 Contracture, right hip: Secondary | ICD-10-CM

## 2020-07-23 DIAGNOSIS — M999 Biomechanical lesion, unspecified: Secondary | ICD-10-CM | POA: Insufficient documentation

## 2020-07-23 NOTE — Patient Instructions (Signed)
Tried manipulation hope that helps What happens in [place]... Can't wait for the new shoes See me in 5-6 weeks

## 2020-07-23 NOTE — Assessment & Plan Note (Signed)

## 2020-07-23 NOTE — Assessment & Plan Note (Signed)
Chronic tightness.  Discussed with patient about core stability, strengthening, home exercises, patient has meloxicam for breakthrough.  Attempted osteopathic manipulation today, increase activity slowly.  Follow-up again in 4 to 8 weeks

## 2020-07-23 NOTE — Progress Notes (Signed)
[place] Phone: [phone] Subjective:   Fontaine No, am serving as a scribe for Dr. Hulan Saas. This visit occurred during the SARS-CoV-2 public health emergency.  Safety protocols were in place, including screening questions prior to the visit, additional usage of staff PPE, and extensive cleaning of exam room while observing appropriate contact time as indicated for disinfecting solutions.   I'm seeing this patient by the request  of:  Inda Coke, [place]  CC: Low back pain  [id number]   06/13/2020 Hip flexor tightness.  Patient does have hypothyroidism status post thyroidectomy.  Concern for potentially some mild dehydration discussed with patient detail, discussed with meloxicam for breakthrough pain.  Warned of potential side effects.  X-rays ordered today for baseline but likely will be unremarkable.  Patient given home exercises and work with Product/process development scientist.  Patient will follow up with 5 to 6 weeks and could be candidate for possible osteopathic manipulation   Update 07/23/2020 DYLYNN KETNER is a 34 y.o. female coming in with complaint of back pain. Patient states that she did take Meloxicam for 10 days. Could not notice a difference.  Patient still has some tightness noted.  Has been fairly noncompliant with doing exercises.       Past Medical History:  Diagnosis Date  . Anemia   . Chronic vaginitis   . Family history of anesthesia complication    UNCLE WITH  PSUEDOCHOLINESTEROSE  DEFIENCY  . GERD (gastroesophageal reflux disease)   . Malignant neoplasm of thyroid gland ([place]) 10/11/2012  . Panic attacks   . Recurrent boils, vulva    Past Surgical History:  Procedure Laterality Date  . ANTERIOR CRUCIATE LIGAMENT REPAIR Left 2001 - approximate  . LYMPH NODE DISSECTION  10/29/2012   Procedure: LYMPH NODE DISSECTION;  Surgeon: Adin Hector, MD;  Location: [facility];  Service: General;   Laterality: N/A;  central compartment lymph node dissection   . THYROID LOBECTOMY  09/21/2012   Procedure: THYROID LOBECTOMY;  Surgeon: Adin Hector, MD;  Location: [facility];  Service: General;  Laterality: Right;  Right Thyroid Lobectomy  . THYROID LOBECTOMY  10/29/2012   Procedure: THYROID LOBECTOMY;  Surgeon: Adin Hector, MD;  Location: [facility];  Service: General;  Laterality: Left;  left thyroid lobectomy,central compartmental lymph node dissection   Social History   Socioeconomic History  . Marital status: Single    Spouse name: Not on file  . Number of children: Not on file  . Years of education: Not on file  . Highest education level: Not on file  Occupational History  . Occupation: ADMISTRATIVE     Employer: [employer]  Tobacco Use  . Smoking status: Never Smoker  . Smokeless tobacco: Never Used  Vaping Use  . Vaping Use: Never used  Substance and Sexual Activity  . Alcohol use: Yes    Alcohol/week: 4.0 standard drinks    Types: 2 Glasses of wine, 2 Cans of beer per week    Comment: socially  . Drug use: No  . Sexual activity: Not Currently    Partners: Male    Birth control/protection: Abstinence  Other Topics Concern  . 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 Difficulty of Paying Living Expenses: Not on  file  Food Insecurity:   . Worried About Charity fundraiser in the Last Year: Not on file  . Ran Out of Food in the Last Year: Not on file  Transportation Needs:   . Lack of Transportation (Medical): Not on file  . Lack of Transportation (Non-Medical): Not on file  Physical Activity:   . Days of Exercise per Week: Not on file  . Minutes of Exercise per Session: Not on file  Stress:   . Feeling of Stress : Not on file  Social Connections:   . Frequency of Communication with Friends and Family: Not on file  .  Frequency of Social Gatherings with Friends and Family: Not on file  . Attends Religious Services: Not on file  . Active Member of Clubs or Organizations: Not on file  . Attends Archivist Meetings: Not on file  . Marital Status: Not on file   Allergies  Allergen Reactions  . Shellfish Allergy   . Other Other (See Comments)    Anethesia  . Pertussis Vaccines    Family History  Problem Relation Age of Onset  . Hypertension Mother   . Heart attack Mother   . Thyroid cancer Mother   . Multiple sclerosis Mother   . Anxiety disorder Brother   . Diabetes type I Brother   . Arthritis Maternal Grandmother   . Heart disease Paternal Grandmother   . Parkinson's disease Paternal Grandmother   . Heart attack Paternal Grandfather   . Allergic rhinitis Neg Hx   . Angioedema Neg Hx   . Asthma Neg Hx   . Eczema Neg Hx   . Colon cancer Neg Hx   . Breast cancer Neg Hx     Current Outpatient Medications (Endocrine & Metabolic):  .  levothyroxine (SYNTHROID) 137 MCG tablet, Take 137 mcg by mouth daily.  Current Outpatient Medications (Cardiovascular):  Marland Kitchen  EPINEPHrine 0.3 mg/0.3 mL IJ SOAJ injection, INJECT AS DIRECTED FOR SEVERE ALLERGIC REACTION  Current Outpatient Medications (Respiratory):  .  benzonatate (TESSALON PERLES) 100 MG capsule, Take 1 capsule (100 mg total) by mouth 3 (three) times daily as needed. .  loratadine (CLARITIN) 10 MG tablet, Take 10 mg by mouth daily.  Current Outpatient Medications (Analgesics):  .  meloxicam (MOBIC) 7.5 MG tablet, Take 1 tablet (7.5 mg total) by mouth daily.   Current Outpatient Medications (Other):  .  clonazePAM (KLONOPIN) 0.5 MG tablet, TAKE 1 TABLET BY MOUTH TWICE DAILY AS NEEDED FOR ANXIETY .  doxycycline (VIBRA-TABS) 100 MG tablet, Take 1 tablet (100 mg total) by mouth 2 (two) times daily. Marland Kitchen  escitalopram (LEXAPRO) 10 MG tablet, Take 1 tablet (10 mg total) by mouth daily. .  Probiotic Product (PROBIOTIC-10) CAPS, Take by  mouth.   Reviewed prior external information including notes and imaging from  primary care provider As well as notes that were available from care everywhere and other healthcare systems.  Past medical history, social, surgical and family history all reviewed in electronic medical record.  No pertanent information unless stated regarding to the chief complaint.   Review of Systems:  No headache, visual changes, nausea, vomiting, diarrhea, constipation, dizziness, abdominal pain, skin rash, fevers, chills, night sweats, weight loss, swollen lymph nodes, body aches, joint swelling, chest pain, shortness of breath, mood changes. POSITIVE muscle aches  Objective  Blood pressure 124/88, pulse (!) 102, height 5' 4.5" (1.638 m), weight 283 lb (128.4 kg), SpO2 98 %.   General: No apparent distress alert and  oriented x3 mood and affect normal, dressed appropriately.  HEENT: Pupils equal, extraocular movements intact  Respiratory: Patient's speak in full sentences and does not appear short of breath  Cardiovascular: No lower extremity edema, non tender, no erythema  Neuro: Cranial nerves II through XII are intact, neurovascularly intact in all extremities with 2+ DTRs and 2+ pulses.  Gait normal with good balance and coordination.  MSK:  Non tender with full range of motion and good stability and symmetric strength and tone of shoulders, elbows, wrist, hip, knee and ankles bilaterally.  Low back exam shows the patient does have tightness in the paraspinal musculature lumbar spine right greater than left.  Tightness with Corky Sox right greater than left as well.  Osteopathic findings  T7 extended rotated and side bent left L2 flexed rotated and side bent right Sacrum right on right    Impression and Recommendations:     The above documentation has been reviewed and is accurate and complete Lyndal Pulley, DO

## 2020-08-19 DIAGNOSIS — Z20822 Contact with and (suspected) exposure to covid-19: Secondary | ICD-10-CM | POA: Diagnosis not present

## 2020-08-21 ENCOUNTER — Other Ambulatory Visit (HOSPITAL_COMMUNITY): Payer: Self-pay | Admitting: Endocrinology

## 2020-08-21 MED FILL — clonazePAM 0.5 MG TABS: 0.5 | 15 days supply | Qty: 30 | Fill #1

## 2020-08-21 MED FILL — LEVOTHYROXINE 137 MCG TAB: 137 | 90 days supply | Qty: 90 | Fill #0

## 2020-09-03 ENCOUNTER — Ambulatory Visit (INDEPENDENT_AMBULATORY_CARE_PROVIDER_SITE_OTHER): Payer: 59 | Admitting: Family Medicine

## 2020-09-03 ENCOUNTER — Encounter: Payer: Self-pay | Admitting: Family Medicine

## 2020-09-03 ENCOUNTER — Other Ambulatory Visit: Payer: Self-pay

## 2020-09-03 VITALS — BP 118/82 | HR 95 | Ht 64.5 in | Wt 288.0 lb

## 2020-09-03 DIAGNOSIS — M999 Biomechanical lesion, unspecified: Secondary | ICD-10-CM | POA: Diagnosis not present

## 2020-09-03 DIAGNOSIS — M24551 Contracture, right hip: Secondary | ICD-10-CM

## 2020-09-03 NOTE — Patient Instructions (Signed)
Great to see you survived Kyung Rudd is your friend Continue the vitamin D and start the tart cherry  Meloxicam only when you need it Try to do exercises regularly  See me again in 6 weeks

## 2020-09-03 NOTE — Assessment & Plan Note (Signed)
Does have some tightness noted.  Patient's x-rays show some very mild arthritic changes at L3-L4 but otherwise fairly unremarkable.  Patient did have a mild flare after the manipulation last exam but hopefully patient will do better.  Discussed posture and ergonomics, discussed the meloxicam.  Could potentially add gabapentin or potentially physical therapy.  Follow-up with me again in 6 to 8 weeks

## 2020-09-03 NOTE — Progress Notes (Signed)
[place] Phone: [phone] Subjective:   Fontaine No, am serving as a scribe for Dr. Hulan Saas. This visit occurred during the SARS-CoV-2 public health emergency.  Safety protocols were in place, including screening questions prior to the visit, additional usage of staff PPE, and extensive cleaning of exam room while observing appropriate contact time as indicated for disinfecting solutions.   I'm seeing this patient by the request  of:  Inda Coke, [place]  CC: Low back pain follow-up  [id number]  CHIOMA MUKHERJEE is a 34 y.o. female coming in with complaint of back and neck pain. OMT 07/23/2020. Patient states that she has not had a great deal of change since last visit. Pain is less when she is moving around. Pain is at worst in the mornings and after sitting at work. Pain after adjustment patient states that overall his medial minorly improved but nothing severe.  Patient has not been completely compliant though  Medications patient has been prescribed: MOBIC  Taking: No         Reviewed prior external information including notes and imaging from previsou exam, outside providers and external EMR if available.   As well as notes that were available from care everywhere and other healthcare systems.  Past medical history, social, surgical and family history all reviewed in electronic medical record.  No pertanent information unless stated regarding to the chief complaint.   Past Medical History:  Diagnosis Date  . Anemia   . Chronic vaginitis   . Family history of anesthesia complication    UNCLE WITH  PSUEDOCHOLINESTEROSE  DEFIENCY  . GERD (gastroesophageal reflux disease)   . Malignant neoplasm of thyroid gland ([place]) 10/11/2012  . Panic attacks   . Recurrent boils, vulva     Allergies  Allergen Reactions  . Shellfish Allergy   . Other Other (See Comments)    Anethesia  . Pertussis  Vaccines      Review of Systems:  No headache, visual changes, nausea, vomiting, diarrhea, constipation, dizziness, abdominal pain, skin rash, fevers, chills, night sweats, weight loss, swollen lymph nodes, body aches, joint swelling, chest pain, shortness of breath, mood changes. POSITIVE muscle aches  Objective  Blood pressure 118/82, pulse 95, height 5' 4.5" (1.638 m), weight 288 lb (130.6 kg), SpO2 97 %.   General: No apparent distress alert and oriented x3 mood and affect normal, dressed appropriately.  Patient is overweight HEENT: Pupils equal, extraocular movements intact  Respiratory: Patient's speak in full sentences and does not appear short of breath  Cardiovascular: No lower extremity edema, non tender, no erythema  Low back exam does have some mild loss of lordosis, some tenderness to palpation of the paraspinal musculature of the lumbar spine right greater than left.  Tightness of the right compared to the contralateral side Osteopathic findings   T8 extended rotated and side bent left L2 flexed rotated and side bent right Sacrum right on right       Assessment and Plan:  Hip flexor tightness, right Does have some tightness noted.  Patient's x-rays show some very mild arthritic changes at L3-L4 but otherwise fairly unremarkable.  Patient did have a mild flare after the manipulation last exam but hopefully patient will do better.  Discussed posture and ergonomics, discussed the meloxicam.  Could potentially add gabapentin or potentially physical therapy.  Follow-up with me again in 6 to 8 weeks   Nonallopathic problems  Decision today to  treat with OMT was based on Physical Exam  After verbal consent patient was treated with HVLA, ME, FPR techniques in  thoracic, lumbar, and sacral  areas  Patient tolerated the procedure well with improvement in symptoms  Patient given exercises, stretches and lifestyle modifications  See medications in patient instructions if  given  Patient will follow up in 4-8 weeks      The above documentation has been reviewed and is accurate and complete Lyndal Pulley, DO       Note: This dictation was prepared with Dragon dictation along with smaller phrase technology. Any transcriptional errors that result from this process are unintentional.

## 2020-09-24 MED FILL — ESCITALOPRAM 10 MG TABLET: 10 | 90 days supply | Qty: 90 | Fill #2

## 2020-10-12 ENCOUNTER — Other Ambulatory Visit: Payer: 59

## 2020-10-22 ENCOUNTER — Other Ambulatory Visit: Payer: Self-pay

## 2020-10-22 ENCOUNTER — Encounter: Payer: Self-pay | Admitting: Physician Assistant

## 2020-10-22 DIAGNOSIS — M999 Biomechanical lesion, unspecified: Secondary | ICD-10-CM

## 2020-10-22 DIAGNOSIS — M549 Dorsalgia, unspecified: Secondary | ICD-10-CM

## 2020-10-22 DIAGNOSIS — M24551 Contracture, right hip: Secondary | ICD-10-CM

## 2020-10-23 ENCOUNTER — Ambulatory Visit: Payer: 59 | Admitting: Family Medicine

## 2020-10-23 NOTE — Progress Notes (Deleted)
[facility] [address] [place] Phone: [phone] Subjective:    I'm seeing this patient by the request  of:  Inda Coke, [place]  CC:   [id number]  Lisa Bolton is a 35 y.o. female coming in with complaint of back and neck pain. OMT 09/23/2020. Patient states   Medications patient has been prescribed: Meloxicam  Taking:         Reviewed prior external information including notes and imaging from previsou exam, outside providers and external EMR if available.   As well as notes that were available from care everywhere and other healthcare systems.  Past medical history, social, surgical and family history all reviewed in electronic medical record.  No pertanent information unless stated regarding to the chief complaint.   Past Medical History:  Diagnosis Date  . Anemia   . Chronic vaginitis   . Family history of anesthesia complication    UNCLE WITH  PSUEDOCHOLINESTEROSE  DEFIENCY  . GERD (gastroesophageal reflux disease)   . Malignant neoplasm of thyroid gland ([place]) 10/11/2012  . Panic attacks   . Recurrent boils, vulva     Allergies  Allergen Reactions  . Shellfish Allergy   . Other Other (See Comments)    Anethesia  . Pertussis Vaccines      Review of Systems:  No headache, visual changes, nausea, vomiting, diarrhea, constipation, dizziness, abdominal pain, skin rash, fevers, chills, night sweats, weight loss, swollen lymph nodes, body aches, joint swelling, chest pain, shortness of breath, mood changes. POSITIVE muscle aches  Objective  There were no vitals taken for this visit.   General: No apparent distress alert and oriented x3 mood and affect normal, dressed appropriately.  HEENT: Pupils equal, extraocular movements intact  Respiratory: Patient's speak in full sentences and does not appear short of breath  Cardiovascular: No lower extremity edema, non tender, no erythema  Neuro: Cranial  nerves II through XII are intact, neurovascularly intact in all extremities with 2+ DTRs and 2+ pulses.  Gait normal with good balance and coordination.  MSK:  Non tender with full range of motion and good stability and symmetric strength and tone of shoulders, elbows, wrist, hip, knee and ankles bilaterally.  Back - Normal skin, Spine with normal alignment and no deformity.  No tenderness to vertebral process palpation.  Paraspinous muscles are not tender and without spasm.   Range of motion is full at neck and lumbar sacral regions  Osteopathic findings  C2 flexed rotated and side bent right C6 flexed rotated and side bent left T3 extended rotated and side bent right inhaled rib T9 extended rotated and side bent left L2 flexed rotated and side bent right Sacrum right on right       Assessment and Plan:    Nonallopathic problems  Decision today to treat with OMT was based on Physical Exam  After verbal consent patient was treated with HVLA, ME, FPR techniques in cervical, rib, thoracic, lumbar, and sacral  areas  Patient tolerated the procedure well with improvement in symptoms  Patient given exercises, stretches and lifestyle modifications  See medications in patient instructions if given  Patient will follow up in 4-8 weeks      The above documentation has been reviewed and is accurate and complete [name]       Note: This dictation was prepared with Dragon dictation along with smaller phrase technology. Any transcriptional errors that result from this process are unintentional.

## 2020-10-24 ENCOUNTER — Other Ambulatory Visit: Payer: Self-pay

## 2020-10-24 ENCOUNTER — Ambulatory Visit: Payer: No Typology Code available for payment source | Admitting: Family Medicine

## 2020-10-24 ENCOUNTER — Encounter: Payer: Self-pay | Admitting: Family Medicine

## 2020-10-24 VITALS — BP 120/78 | HR 96 | Ht 64.5 in | Wt 277.0 lb

## 2020-10-24 DIAGNOSIS — G5702 Lesion of sciatic nerve, left lower limb: Secondary | ICD-10-CM | POA: Diagnosis not present

## 2020-10-24 DIAGNOSIS — M999 Biomechanical lesion, unspecified: Secondary | ICD-10-CM | POA: Diagnosis not present

## 2020-10-24 DIAGNOSIS — M24551 Contracture, right hip: Secondary | ICD-10-CM | POA: Diagnosis not present

## 2020-10-24 NOTE — Assessment & Plan Note (Signed)
Patient is making good strides at this time.  Having some mild piriformis syndrome.  Given new exercises.  Discussed core strengthening.  Patient is losing weight and encouraged her to continue to do so.  Responding well to manipulation.  Follow-up with me again in 2 to 3 months

## 2020-10-24 NOTE — Progress Notes (Signed)
[facility] [address] [place] Phone: [phone] Subjective:   I Lisa Bolton am serving as a Education administrator for Dr. Hulan Saas.  This visit occurred during the SARS-CoV-2 public health emergency.  Safety protocols were in place, including screening questions prior to the visit, additional usage of staff PPE, and extensive cleaning of exam room while observing appropriate contact time as indicated for disinfecting solutions.   I'm seeing this patient by the request  of:  Inda Coke, [place]  CC: Low back pain follow-up  [id number]  KEYANNA SANDEFER is a 35 y.o. female coming in with complaint of back and neck pain. OMT 09/03/2020. Patient states she is feeling better. Some pain in the lower back but can tell she is doing better. Dieting and recently lost about 15 lbs. patient states that overall she is feeling like she is making some improvement.  Did have some mild increase in the left buttocks pain.  This was somewhat new.  Did not feel he needed any type of medications for it.  Medications patient has been prescribed: Meloxicam          Reviewed prior external information including notes and imaging from previsou exam, outside providers and external EMR if available.   As well as notes that were available from care everywhere and other healthcare systems.  Past medical history, social, surgical and family history all reviewed in electronic medical record.  No pertanent information unless stated regarding to the chief complaint.   Past Medical History:  Diagnosis Date  . Anemia   . Chronic vaginitis   . Family history of anesthesia complication    UNCLE WITH  PSUEDOCHOLINESTEROSE  DEFIENCY  . GERD (gastroesophageal reflux disease)   . Malignant neoplasm of thyroid gland ([place]) 10/11/2012  . Panic attacks   . Recurrent boils, vulva     Allergies  Allergen Reactions  . Shellfish Allergy   . Other Other (See Comments)     Anethesia  . Pertussis Vaccines      Review of Systems:  No headache, visual changes, nausea, vomiting, diarrhea, constipation, dizziness, abdominal pain, skin rash, fevers, chills, night sweats, weight loss, swollen lymph nodes, body aches, joint swelling, chest pain, shortness of breath, mood changes. POSITIVE muscle aches  Objective  Blood pressure 120/78, pulse 96, height 5' 4.5" (1.638 m), weight 277 lb (125.6 kg), SpO2 98 %.   General: No apparent distress alert and oriented x3 mood and affect normal, dressed appropriately.  Overweight HEENT: Pupils equal, extraocular movements intact  Respiratory: Patient's speak in full sentences and does not appear short of breath  Cardiovascular: No lower extremity edema, non tender, no erythema  Neuro: Cranial nerves II through XII are intact, neurovascularly intact in all extremities with 2+ DTRs and 2+ pulses.  Gait normal with good balance and coordination.  MSK:  Non tender with full range of motion and good stability and symmetric strength and tone of shoulders, elbows, wrist, hip, knee and ankles bilaterally.  Back -low back difficult to assess secondary to patient's body habitus.  Still has tightness noted in the paraspinal musculature of the lumbar spine.  Increasing discomfort in the left piriformis.  Patient does still have some discomfort of the sacroiliac joints bilaterally.  Significant tightness of the hip flexor right greater than left.  Osteopathic findings   T8 extended rotated and side bent left L2 flexed rotated and side bent right Sacrum right on right  Assessment and Plan:  Hip flexor tightness, right Patient is making good strides at this time.  Having some mild piriformis syndrome.  Given new exercises.  Discussed core strengthening.  Patient is losing weight and encouraged her to continue to do so.  Responding well to manipulation.  Follow-up with me again in 2 to 3 months  Morbid obesity [facility]) Patient has  lost some weight.  Encouraged her to continue to do so.  I do think that this is one of the best thing she can do for her back.  Patient will follow-up with me again 8 weeks  Piriformis syndrome of left side Using Netter's Orthopaedic Anatomy, reviewed with the patient the structures involved and how they related to diagnosis. The patient indicated understanding.   The patient was given a handout about classic piriformis stretching including [employer], Modified [employer], my self-described "Sink Stretch," and other piriformis rehab.  We also reviewed hip flexor and abductor strengthening, ham stretching  Rec deep massage, explained self-massage with ball     Nonallopathic problems  Decision today to treat with OMT was based on Physical Exam  After verbal consent patient was treated with HVLA, ME, FPR techniques in  thoracic, lumbar, and sacral  areas  Patient tolerated the procedure well with improvement in symptoms  Patient given exercises, stretches and lifestyle modifications  See medications in patient instructions if given  Patient will follow up in 4-8 weeks      The above documentation has been reviewed and is accurate and complete Lyndal Pulley, DO       Note: This dictation was prepared with Dragon dictation along with smaller phrase technology. Any transcriptional errors that result from this process are unintentional.

## 2020-10-24 NOTE — Patient Instructions (Addendum)
Good to see you Keep up the weight loss Piriformis exercises Tennis ball aback left pocket with a lot of sitting  See me again in 2 months

## 2020-10-24 NOTE — Assessment & Plan Note (Signed)
Using Netter's Orthopaedic Anatomy, reviewed with the patient the structures involved and how they related to diagnosis. The patient indicated understanding.   The patient was given a handout about classic piriformis stretching including Pigeon Pose, Modified Pigeon Pose, my self-described "Sink Stretch," and other piriformis rehab.  We also reviewed hip flexor and abductor strengthening, ham stretching  Rec deep massage, explained self-massage with ball  

## 2020-10-24 NOTE — Assessment & Plan Note (Signed)
Patient has lost some weight.  Encouraged her to continue to do so.  I do think that this is one of the best thing she can do for her back.  Patient will follow-up with me again 8 weeks

## 2020-11-15 MED FILL — LEVOTHYROXINE 137 MCG TAB: 137 | 90 days supply | Qty: 90 | Fill #1

## 2020-11-28 ENCOUNTER — Encounter: Payer: 59 | Admitting: Physician Assistant

## 2020-12-19 ENCOUNTER — Other Ambulatory Visit: Payer: Self-pay | Admitting: Physician Assistant

## 2020-12-19 DIAGNOSIS — F411 Generalized anxiety disorder: Secondary | ICD-10-CM

## 2020-12-19 NOTE — Telephone Encounter (Signed)
Please keep upcoming appointment with Dr.Worley for further appointments.

## 2020-12-21 ENCOUNTER — Ambulatory Visit: Payer: No Typology Code available for payment source | Admitting: Family Medicine

## 2021-01-04 ENCOUNTER — Encounter: Payer: No Typology Code available for payment source | Admitting: Physician Assistant

## 2021-01-04 ENCOUNTER — Telehealth: Payer: Self-pay

## 2021-01-04 ENCOUNTER — Other Ambulatory Visit: Payer: Self-pay | Admitting: Physician Assistant

## 2021-01-04 MED ORDER — LEVOTHYROXINE SODIUM 137 MCG PO TABS: 137.0000 ug | ORAL_TABLET | Freq: Every day | ORAL | 2 refills | Status: DC

## 2021-01-04 NOTE — Telephone Encounter (Signed)
Refill sent in

## 2021-01-04 NOTE — Progress Notes (Deleted)
Subjective:    Lisa Bolton is a 35 y.o. female and is here for a comprehensive physical exam.  HPI  Health Maintenance Due  Topic Date Due  . Hepatitis C Screening  Never done  . COVID-19 Vaccine (3 - Pfizer risk 4-dose series) 12/14/2019    Acute Concerns:   Chronic Issues:   Health Maintenance: Immunizations -- *** Colonoscopy -- none Mammogram -- had mammo in 2018 due to breast abnormality; none recommended since PAP -- 07/2018 -- WNL Diet -- *** Caffeine intake -- *** Sleep habits -- *** Exercise -- *** Current Weight --    Weight History: Wt Readings from Last 10 Encounters:  10/24/20 277 lb (125.6 kg)  09/03/20 288 lb (130.6 kg)  07/23/20 283 lb (128.4 kg)  06/14/20 280 lb (127 kg)  06/13/20 281 lb (127.5 kg)  11/28/19 260 lb 4 oz (118 kg)  11/05/18 266 lb (120.7 kg)  07/16/18 257 lb 3.2 oz (116.7 kg)  07/07/18 262 lb 12.8 oz (119.2 kg)  06/16/18 261 lb (118.4 kg)   There is no height or weight on file to calculate BMI. Mood -- *** No LMP recorded. Period characteristics -- *** Birth control -- ***  Depression screen [facility] 2/9 11/28/2019  Decreased Interest 0  Down, Depressed, Hopeless 0  PHQ - 2 Score 0  Altered sleeping 0  Tired, decreased energy 0  Change in appetite 0  Feeling bad or failure about yourself  0  Trouble concentrating 0  Moving slowly or fidgety/restless 0  Suicidal thoughts 0  PHQ-9 Score 0  Difficult doing work/chores Not difficult at all     Other providers/specialists: Patient Care Team: Inda Coke, [place] as PCP - General (Physician Assistant) Reynold Bowen, MD as Consulting Physician (Endocrinology)   PMHx, SurgHx, SocialHx, Medications, and Allergies were reviewed in the Visit Navigator and updated as appropriate.   Past Medical History:  Diagnosis Date  . Anemia   . Chronic vaginitis   . Family history of anesthesia complication    UNCLE WITH  PSUEDOCHOLINESTEROSE  DEFIENCY  . GERD  (gastroesophageal reflux disease)   . Malignant neoplasm of thyroid gland ([place]) 10/11/2012  . Panic attacks   . Recurrent boils, vulva      Past Surgical History:  Procedure Laterality Date  . ANTERIOR CRUCIATE LIGAMENT REPAIR Left 2001 - approximate  . LYMPH NODE DISSECTION  10/29/2012   Procedure: LYMPH NODE DISSECTION;  Surgeon: Adin Hector, MD;  Location: [place];  Service: General;  Laterality: N/A;  central compartment lymph node dissection   . THYROID LOBECTOMY  09/21/2012   Procedure: THYROID LOBECTOMY;  Surgeon: Adin Hector, MD;  Location: [facility];  Service: General;  Laterality: Right;  Right Thyroid Lobectomy  . THYROID LOBECTOMY  10/29/2012   Procedure: THYROID LOBECTOMY;  Surgeon: Adin Hector, MD;  Location: [place];  Service: General;  Laterality: Left;  left thyroid lobectomy,central compartmental lymph node dissection     Family History  Problem Relation Age of Onset  . Hypertension Mother   . Heart attack Mother   . Thyroid cancer Mother   . Multiple sclerosis Mother   . Anxiety disorder Brother   . Diabetes type I Brother   . Arthritis Maternal Grandmother   . Heart disease Paternal Grandmother   . Parkinson's disease Paternal Grandmother   . Heart attack Paternal Grandfather   . Allergic rhinitis Neg Hx   . Angioedema Neg Hx   . Asthma Neg Hx   .  Eczema Neg Hx   . Colon cancer Neg Hx   . Breast cancer Neg Hx     Social History   Tobacco Use  . Smoking status: Never Smoker  . Smokeless tobacco: Never Used  Vaping Use  . Vaping Use: Never used  Substance Use Topics  . Alcohol use: Yes    Alcohol/week: 4.0 standard drinks    Types: 2 Glasses of wine, 2 Cans of beer per week    Comment: socially  . Drug use: No    Review of Systems:   ROS  Objective:   There were no vitals taken for this visit.  General Appearance:    Alert, cooperative, no distress, appears stated age  Head:    Normocephalic, without obvious abnormality, atraumatic   Eyes:    PERRL, conjunctiva/corneas clear, EOM's intact, fundi    benign, both eyes  Ears:    Normal TM's and external ear canals, both ears  Nose:   Nares normal, septum midline, mucosa normal, no drainage    or sinus tenderness  Throat:   Lips, mucosa, and tongue normal; teeth and gums normal  Neck:   Supple, symmetrical, trachea midline, no adenopathy;    thyroid:  no enlargement/tenderness/nodules; no carotid   bruit or JVD  Back:     Symmetric, no curvature, ROM normal, no CVA tenderness  Lungs:     Clear to auscultation bilaterally, respirations unlabored  Chest Wall:    No tenderness or deformity   Heart:    Regular rate and rhythm, S1 and S2 normal, no murmur, rub   or gallop  Breast Exam:    No tenderness, masses, or nipple abnormality  Abdomen:     Soft, non-tender, bowel sounds active all four quadrants,    no masses, no organomegaly  Genitalia:    Normal female without lesion, discharge or tenderness  Rectal:    Normal tone no masses or tenderness  Extremities:   Extremities normal, atraumatic, no cyanosis or edema  Pulses:   2+ and symmetric all extremities  Skin:   Skin color, texture, turgor normal, no rashes or lesions  Lymph nodes:   Cervical, supraclavicular, and axillary nodes normal  Neurologic:   CNII-XII intact, normal strength, sensation and reflexes    throughout   Results for orders placed or performed in visit on 06/06/20  Novel Coronavirus, NAA ([facility])   Specimen: Nasopharyngeal(NP) swabs in vial transport medium   Nasopharynge  Screenin  Result Value Ref Range   SARS-CoV-2, NAA Not Detected Not Detected    Assessment/Plan:   There are no diagnoses linked to this encounter.  Well Adult Exam: Labs ordered: {Yes/No:18319::"Yes"}. Patient counseling was done. See below for items discussed. Discussed the patient's BMI. The {BMI plan (MU NQF measure 421):19504} Follow up {follow-up interval:13814}.  Patient Counseling:   [x]     Nutrition: Stressed  importance of moderation in sodium/caffeine intake, saturated fat and cholesterol, caloric balance, sufficient intake of fresh fruits, vegetables, fiber, calcium, iron, and 1 mg of folate supplement per day (for females capable of pregnancy).   [x]      Stressed the importance of regular exercise.    [x]     Substance Abuse: Discussed cessation/primary prevention of tobacco, alcohol, or other drug use; driving or other dangerous activities under the influence; availability of treatment for abuse.    [x]      Injury prevention: Discussed safety belts, safety helmets, smoke detector, smoking near bedding or upholstery.    [x]   Sexuality: Discussed sexually transmitted diseases, partner selection, use of condoms, avoidance of unintended pregnancy  and contraceptive alternatives.    [x]     Dental health: Discussed importance of regular tooth brushing, flossing, and dental visits.   [x]      Health maintenance and immunizations reviewed. Please refer to Health maintenance section.   Inda Coke, PA-C [place]

## 2021-01-04 NOTE — Telephone Encounter (Signed)
MEDICATION: levothyroxine (SYNTHROID) 137 MCG tablet  PHARMACY:  [place], [place] - [address] [facility]. Phone:  [phone]  Fax:  [phone]       Comments:   **Let patient know to contact pharmacy at the end of the day to make sure medication is ready. **  ** Please notify patient to allow 48-72 hours to process**  **Encourage patient to contact the pharmacy for refills or they can request refills through [facility]**

## 2021-01-22 ENCOUNTER — Ambulatory Visit (INDEPENDENT_AMBULATORY_CARE_PROVIDER_SITE_OTHER): Payer: No Typology Code available for payment source | Admitting: Physician Assistant

## 2021-01-22 ENCOUNTER — Other Ambulatory Visit (HOSPITAL_COMMUNITY)
Admission: RE | Admit: 2021-01-22 | Discharge: 2021-01-22 | Disposition: A | Discharge status: Home / Self Care | Payer: No Typology Code available for payment source | Source: Ambulatory Visit | Attending: Physician Assistant | Admitting: Physician Assistant

## 2021-01-22 ENCOUNTER — Other Ambulatory Visit: Payer: Self-pay

## 2021-01-22 ENCOUNTER — Other Ambulatory Visit (HOSPITAL_COMMUNITY): Payer: Self-pay

## 2021-01-22 ENCOUNTER — Encounter: Payer: Self-pay | Admitting: Physician Assistant

## 2021-01-22 VITALS — BP 110/76 | HR 75 | Temp 97.2°F | Ht 64.5 in | Wt 282.4 lb

## 2021-01-22 DIAGNOSIS — E559 Vitamin D deficiency, unspecified: Secondary | ICD-10-CM

## 2021-01-22 DIAGNOSIS — Z124 Encounter for screening for malignant neoplasm of cervix: Secondary | ICD-10-CM

## 2021-01-22 DIAGNOSIS — Z0000 Encounter for general adult medical examination without abnormal findings: Secondary | ICD-10-CM

## 2021-01-22 DIAGNOSIS — F411 Generalized anxiety disorder: Secondary | ICD-10-CM

## 2021-01-22 DIAGNOSIS — Z113 Encounter for screening for infections with a predominantly sexual mode of transmission: Secondary | ICD-10-CM

## 2021-01-22 DIAGNOSIS — E039 Hypothyroidism, unspecified: Secondary | ICD-10-CM

## 2021-01-22 DIAGNOSIS — E669 Obesity, unspecified: Secondary | ICD-10-CM

## 2021-01-22 DIAGNOSIS — Z13220 Encounter for screening for lipoid disorders: Secondary | ICD-10-CM

## 2021-01-22 DIAGNOSIS — Z114 Encounter for screening for human immunodeficiency virus [HIV]: Secondary | ICD-10-CM

## 2021-01-22 DIAGNOSIS — Z1159 Encounter for screening for other viral diseases: Secondary | ICD-10-CM

## 2021-01-22 DIAGNOSIS — M549 Dorsalgia, unspecified: Secondary | ICD-10-CM

## 2021-01-22 DIAGNOSIS — Z136 Encounter for screening for cardiovascular disorders: Secondary | ICD-10-CM

## 2021-01-22 MED ORDER — CLONAZEPAM 0.5 MG PO TABS
0.5000 mg | ORAL_TABLET | Freq: Two times a day (BID) | ORAL | 1 refills | Status: DC | PRN
  Filled 2021-01-22: qty 30, 15d supply, fill #0

## 2021-01-22 NOTE — Progress Notes (Signed)
I acted as a Education administrator for [employer], PA-C Anselmo Pickler, LPN   Subjective:    Lisa Bolton is a 35 y.o. female and is here for a comprehensive physical exam.  HPI  Health Maintenance Due  Topic Date Due  . Hepatitis C Screening  Never done  . COVID-19 Vaccine (4 - Booster for Pfizer series) 01/20/2021    Acute Concerns: None  Chronic Issues: Hypothyroidism, s/p thyroid cancer -- currently on 137 mcg daily. Was increased from 125 mcg daily last year. Sees Dr. Forde Dandy for management of this, but we typically draw her labs. Anxiety -- doing well on lexapro 10 mg, tolerating well. Does not currently seeing therapist. Takes 0.5 mg klonopin rarely, but does need refill of this. Denies SI/HI. Back pain -- seeing Charlann Boxer for this. Admits to sitting all day during work hours, working from home, on couch. No scheduled exercise. Has had some weight gain which has not helped. Denies: saddle anesthesia, incontinence, radiation of pain. She has tried mobic without any significant relief. Vitamin D def -- takes 5000 IU when she remembers. Denies any significant concerns at this time.  Health Maintenance: Immunizations -- UTD Colonoscopy -- N/A Mammogram -- N/A PAP -- due, will do today Bone Density -- N/A Diet -- working on better eating habits as able Sleep habits -- denies concerns Exercise -- none presently Current Weight -- Weight: 282 lb 6.1 oz (128.1 kg)  Weight History: Wt Readings from Last 10 Encounters:  01/22/21 282 lb 6.1 oz (128.1 kg)  10/24/20 277 lb (125.6 kg)  09/03/20 288 lb (130.6 kg)  07/23/20 283 lb (128.4 kg)  06/14/20 280 lb (127 kg)  06/13/20 281 lb (127.5 kg)  11/28/19 260 lb 4 oz (118 kg)  11/05/18 266 lb (120.7 kg)  07/16/18 257 lb 3.2 oz (116.7 kg)  07/07/18 262 lb 12.8 oz (119.2 kg)   Body mass index is 47.72 kg/m. Mood -- stable Patient's last menstrual period was 12/29/2020. Birth control -- none Alcohol intake -- social; no  concerns  Depression screen [facility] 2/9 01/22/2021  Decreased Interest 0  Down, Depressed, Hopeless 0  PHQ - 2 Score 0  Altered sleeping -  Tired, decreased energy -  Change in appetite -  Feeling bad or failure about yourself  -  Trouble concentrating -  Moving slowly or fidgety/restless -  Suicidal thoughts -  PHQ-9 Score -  Difficult doing work/chores -     Other providers/specialists: Patient Care Team: Inda Coke, [place] as PCP - General (Physician Assistant) Reynold Bowen, MD as Consulting Physician (Endocrinology)   PMHx, SurgHx, SocialHx, Medications, and Allergies were reviewed in the Visit Navigator and updated as appropriate.   Past Medical History:  Diagnosis Date  . Anemia   . Chronic vaginitis   . Family history of anesthesia complication    UNCLE WITH  PSUEDOCHOLINESTEROSE  DEFIENCY  . GERD (gastroesophageal reflux disease)   . Malignant neoplasm of thyroid gland ([place]) 10/11/2012  . Panic attacks   . Recurrent boils, vulva      Past Surgical History:  Procedure Laterality Date  . ANTERIOR CRUCIATE LIGAMENT REPAIR Left 2001 - approximate  . LYMPH NODE DISSECTION  10/29/2012   Procedure: LYMPH NODE DISSECTION;  Surgeon: Adin Hector, MD;  Location: [place];  Service: General;  Laterality: N/A;  central compartment lymph node dissection   . THYROID LOBECTOMY  09/21/2012   Procedure: THYROID LOBECTOMY;  Surgeon: Adin Hector, MD;  [facility];  Service: General;  Laterality: Right;  Right Thyroid Lobectomy  . THYROID LOBECTOMY  10/29/2012   Procedure: THYROID LOBECTOMY;  Surgeon: Adin Hector, MD;  Location: [place];  Service: General;  Laterality: Left;  left thyroid lobectomy,central compartmental lymph node dissection     Family History  Problem Relation Age of Onset  . Hypertension Mother   . Heart attack Mother   . Multiple sclerosis Mother   . Thyroid disease Mother   . Anxiety disorder Brother   . Diabetes type I Brother   . Arthritis  Maternal Grandmother   . Heart disease Paternal Grandmother   . Parkinson's disease Paternal Grandmother   . Heart attack Paternal Grandfather   . Allergic rhinitis Neg Hx   . Angioedema Neg Hx   . Asthma Neg Hx   . Eczema Neg Hx   . Colon cancer Neg Hx   . Breast cancer Neg Hx     Social History   Tobacco Use  . Smoking status: Never Smoker  . Smokeless tobacco: Never Used  Vaping Use  . Vaping Use: Never used  Substance Use Topics  . Alcohol use: Yes    Alcohol/week: 4.0 standard drinks    Types: 2 Glasses of wine, 2 Cans of beer per week    Comment: socially  . Drug use: No    Review of Systems:   Review of Systems  Constitutional: Negative for chills, fever, malaise/fatigue and weight loss.  HENT: Negative for hearing loss, sinus pain and sore throat.   Eyes: Negative for blurred vision.  Respiratory: Negative for cough and shortness of breath.   Cardiovascular: Negative for chest pain, palpitations and leg swelling.  Gastrointestinal: Negative for abdominal pain, constipation, diarrhea, heartburn, nausea and vomiting.  Genitourinary: Negative for dysuria, frequency and urgency.  Musculoskeletal: Negative for back pain, myalgias and neck pain.  Skin: Negative for itching and rash.  Neurological: Negative for dizziness, tingling, seizures, loss of consciousness and headaches.  Endo/Heme/Allergies: Negative for polydipsia.  Psychiatric/Behavioral: Negative for depression. The patient is not nervous/anxious.   All other systems reviewed and are negative.   Objective:   BP 110/76 (BP Location: Left Arm, Patient Position: Sitting, Cuff Size: Large)   Pulse 75   Temp (!) 97.2 F (36.2 C) (Temporal)   Ht 5' 4.5" (1.638 m)   Wt 282 lb 6.1 oz (128.1 kg)   LMP 12/29/2020   SpO2 97%   BMI 47.72 kg/m   General Appearance:    Alert, cooperative, no distress, appears stated age  Head:    Normocephalic, without obvious abnormality, atraumatic  Eyes:    PERRL,  conjunctiva/corneas clear, EOM's intact, fundi    benign, both eyes  Ears:    Normal TM's and external ear canals, both ears  Nose:   Nares normal, septum midline, mucosa normal, no drainage    or sinus tenderness  Throat:   Lips, mucosa, and tongue normal; teeth and gums normal  Neck:   Supple, symmetrical, trachea midline, no adenopathy;    thyroid:  no enlargement/tenderness/nodules; no carotid   bruit or JVD  Back:     Symmetric, no curvature, ROM normal, no CVA tenderness  Lungs:     Clear to auscultation bilaterally, respirations unlabored  Chest Wall:    No tenderness or deformity   Heart:    Regular rate and rhythm, S1 and S2 normal, no murmur, rub   or gallop  Breast Exam:    No tenderness, masses, or nipple abnormality  Abdomen:     Soft, non-tender, bowel sounds active all four quadrants,    no masses, no organomegaly  Genitalia:    Normal female without lesion, discharge or tenderness  Rectal:    Normal tone no masses or tenderness  Extremities:   Extremities normal, atraumatic, no cyanosis or edema  Pulses:   2+ and symmetric all extremities  Skin:   Skin color, texture, turgor normal, no rashes or lesions  Lymph nodes:   Cervical, supraclavicular, and axillary nodes normal  Neurologic:   CNII-XII intact, normal strength, sensation and reflexes    throughout    Assessment/Plan:   Ariabella was seen today for annual exam.  Diagnoses and all orders for this visit:  Routine physical examination Today patient counseled on age appropriate routine health concerns for screening and prevention, each reviewed and up to date or declined. Immunizations reviewed and up to date or declined. Labs ordered and reviewed. Risk factors for depression reviewed and negative. Hearing function and visual acuity are intact. ADLs screened and addressed as needed. Functional ability and level of safety reviewed and appropriate. Education, counseling and referrals performed based on assessed risks  today. Patient provided with a copy of personalized plan for preventive services.  Back pain, unspecified back location, unspecified back pain laterality, unspecified chronicity No red flags on discussion or exam. Recommend more movement as able. Encouraged her to find an exercise she likes and can commit too. Work on better set-up for desk at home if going to continue to work from home. Mgmt per Charlann Boxer.  GAD (generalized anxiety disorder) Well controlled with lexapro 10 mg daily and klonopin 0.5 mg prn. -     clonazePAM (KLONOPIN) 0.5 MG tablet; Take 1 tablet (0.5 mg total) by mouth 2 (two) times daily as needed. for anxiety  Vitamin D deficiency Update today per patient request. -     VITAMIN D 25 Hydroxy (Vit-D Deficiency, Fractures)  Encounter for screening for other viral diseases -     Hepatitis C antibody  Screening for HIV (human immunodeficiency virus) -     HIV Antibody (routine testing w rflx) -     RPR  Screening examination for STD (sexually transmitted disease) -     Cervicovaginal ancillary only  Pap smear for cervical cancer screening -     Cytology - PAP  Acquired hypothyroidism Update blood work, defer mgmt to endo per patient request. -     TSH -     T3, free -     T4, free  Obesity, unspecified classification, unspecified obesity type, unspecified whether serious comorbidity present Work on regular exercise and healthy eating. -     CBC with Differential/Platelet -     Comprehensive metabolic panel -     Lipid panel  Encounter for lipid screening for cardiovascular disease -     Lipid panel  GAD (generalized anxiety disorder) Comments: Continue with current medications. Doing well. Orders: -     clonazePAM (KLONOPIN) 0.5 MG tablet; Take 1 tablet (0.5 mg total) by mouth 2 (two) times daily as needed. for anxiety   Well Adult Exam: Labs ordered: Yes. Patient counseling was done. See below for items discussed. Discussed the patient's BMI. The  BMI is not in the acceptable range; BMI management plan is completed Follow up in one year.  Patient Counseling:   [x]     Nutrition: Stressed importance of moderation in sodium/caffeine intake, saturated fat and cholesterol, caloric balance, sufficient intake of fresh fruits, vegetables,  fiber, calcium, iron, and 1 mg of folate supplement per day (for females capable of pregnancy).   [x]      Stressed the importance of regular exercise.    [x]     Substance Abuse: Discussed cessation/primary prevention of tobacco, alcohol, or other drug use; driving or other dangerous activities under the influence; availability of treatment for abuse.    [x]      Injury prevention: Discussed safety belts, safety helmets, smoke detector, smoking near bedding or upholstery.    [x]      Sexuality: Discussed sexually transmitted diseases, partner selection, use of condoms, avoidance of unintended pregnancy  and contraceptive alternatives.    [x]     Dental health: Discussed importance of regular tooth brushing, flossing, and dental visits.   [x]      Health maintenance and immunizations reviewed. Please refer to Health maintenance section.   CMA or LPN served as scribe during this visit. History, Physical, and Plan performed by medical provider. The above documentation has been reviewed and is accurate and complete.   Inda Coke, PA-C [place]

## 2021-01-22 NOTE — Patient Instructions (Addendum)
It was great to see you!  Work on more movement, better desk set-up  Continue to see Thedore Mins for your back  Please go to the lab for blood work.   Our office will call you with your results unless you have chosen to receive results via MyChart.  If your blood work is normal we will follow-up each year for physicals and as scheduled for chronic medical problems.  If anything is abnormal we will treat accordingly and get you in for a follow-up.  Take care,  [facility] Maintenance, Female Adopting a healthy lifestyle and getting preventive care are important in promoting health and wellness. Ask your health care provider about:  The right schedule for you to have regular tests and exams.  Things you can do on your own to prevent diseases and keep yourself healthy. What should I know about diet, weight, and exercise? Eat a healthy diet  Eat a diet that includes plenty of vegetables, fruits, low-fat dairy products, and lean protein.  Do not eat a lot of foods that are high in solid fats, added sugars, or sodium.   Maintain a healthy weight Body mass index (BMI) is used to identify weight problems. It estimates body fat based on height and weight. Your health care provider can help determine your BMI and help you achieve or maintain a healthy weight. Get regular exercise Get regular exercise. This is one of the most important things you can do for your health. Most adults should:  Exercise for at least 150 minutes each week. The exercise should increase your heart rate and make you sweat (moderate-intensity exercise).  Do strengthening exercises at least twice a week. This is in addition to the moderate-intensity exercise.  Spend less time sitting. Even light physical activity can be beneficial. Watch cholesterol and blood lipids Have your blood tested for lipids and cholesterol at 36 years of age, then have this test every 5 years. Have your cholesterol levels checked more  often if:  Your lipid or cholesterol levels are high.  You are older than 35 years of age.  You are at high risk for heart disease. What should I know about cancer screening? Depending on your health history and family history, you may need to have cancer screening at various ages. This may include screening for:  Breast cancer.  Cervical cancer.  Colorectal cancer.  Skin cancer.  Lung cancer. What should I know about heart disease, diabetes, and high blood pressure? Blood pressure and heart disease  High blood pressure causes heart disease and increases the risk of stroke. This is more likely to develop in people who have high blood pressure readings, are of African descent, or are overweight.  Have your blood pressure checked: ? Every 3-5 years if you are 31-32 years of age. ? Every year if you are 21 years old or older. Diabetes Have regular diabetes screenings. This checks your fasting blood sugar level. Have the screening done:  Once every three years after age 52 if you are at a normal weight and have a low risk for diabetes.  More often and at a younger age if you are overweight or have a high risk for diabetes. What should I know about preventing infection? Hepatitis B If you have a higher risk for hepatitis B, you should be screened for this virus. Talk with your health care provider to find out if you are at risk for hepatitis B infection. Hepatitis C Testing is recommended for:  Everyone born from [birth year] through 1966.  Anyone with known risk factors for hepatitis C. Sexually transmitted infections (STIs)  Get screened for STIs, including gonorrhea and chlamydia, if: ? You are sexually active and are younger than 36 years of age. ? You are older than 36 years of age and your health care provider tells you that you are at risk for this type of infection. ? Your sexual activity has changed since you were last screened, and you are at increased risk for chlamydia  or gonorrhea. Ask your health care provider if you are at risk.  Ask your health care provider about whether you are at high risk for HIV. Your health care provider may recommend a prescription medicine to help prevent HIV infection. If you choose to take medicine to prevent HIV, you should first get tested for HIV. You should then be tested every 3 months for as long as you are taking the medicine. Pregnancy  If you are about to stop having your period (premenopausal) and you may become pregnant, seek counseling before you get pregnant.  Take 400 to 800 micrograms (mcg) of folic acid every day if you become pregnant.  Ask for birth control (contraception) if you want to prevent pregnancy. Osteoporosis and menopause Osteoporosis is a disease in which the bones lose minerals and strength with aging. This can result in bone fractures. If you are 75 years old or older, or if you are at risk for osteoporosis and fractures, ask your health care provider if you should:  Be screened for bone loss.  Take a calcium or vitamin D supplement to lower your risk of fractures.  Be given hormone replacement therapy (HRT) to treat symptoms of menopause. Follow these instructions at home: Lifestyle  Do not use any products that contain nicotine or tobacco, such as cigarettes, e-cigarettes, and chewing tobacco. If you need help quitting, ask your health care provider.  Do not use street drugs.  Do not share needles.  Ask your health care provider for help if you need support or information about quitting drugs. Alcohol use  Do not drink alcohol if: ? Your health care provider tells you not to drink. ? You are pregnant, may be pregnant, or are planning to become pregnant.  If you drink alcohol: ? Limit how much you use to 0-1 drink a day. ? Limit intake if you are breastfeeding.  Be aware of how much alcohol is in your drink. In the [place] one drink equals one 12 oz bottle of beer (355 mL), one 5 oz  glass of wine (148 mL), or one 1 oz glass of hard liquor (44 mL). General instructions  Schedule regular health, dental, and eye exams.  Stay current with your vaccines.  Tell your health care provider if: ? You often feel depressed. ? You have ever been abused or do not feel safe at home. Summary  Adopting a healthy lifestyle and getting preventive care are important in promoting health and wellness.  Follow your health care provider's instructions about healthy diet, exercising, and getting tested or screened for diseases.  Follow your health care provider's instructions on monitoring your cholesterol and blood pressure. This information is not intended to replace advice given to you by your health care provider. Make sure you discuss any questions you have with your health care provider. Document Revised: 09/15/2018 Document Reviewed: 09/15/2018 Elsevier Patient Education  2021 [employer].

## 2021-01-23 LAB — COMPREHENSIVE METABOLIC PANEL
ALT: 12 U/L (ref 0–35)
AST: 12 U/L (ref 0–37)
Albumin: 3.8 g/dL (ref 3.5–5.2)
Alkaline Phosphatase: 94 U/L (ref 39–117)
BUN: 18 mg/dL (ref 6–23)
CO2: 26 mEq/L (ref 19–32)
Calcium: 8.6 mg/dL (ref 8.4–10.5)
Chloride: 106 mEq/L (ref 96–112)
Creatinine, Ser: 0.63 mg/dL (ref 0.40–1.20)
GFR: 114.39 mL/min (ref >60.00)
Glucose, Bld: 87 mg/dL (ref 70–99)
Potassium: 4.6 mEq/L (ref 3.5–5.1)
Sodium: 138 mEq/L (ref 135–145)
Total Bilirubin: 0.2 mg/dL (ref 0.2–1.2)
Total Protein: 6.7 g/dL (ref 6.0–8.3)

## 2021-01-23 LAB — CBC WITH DIFFERENTIAL/PLATELET
Basophils Absolute: 0.1 10*3/uL (ref 0.0–0.1)
Basophils Relative: 0.9 % (ref 0.0–3.0)
Eosinophils Absolute: 0.1 10*3/uL (ref 0.0–0.7)
Eosinophils Relative: 1.9 % (ref 0.0–5.0)
HCT: 34.9 % — ABNORMAL LOW (ref 36.0–46.0)
Hemoglobin: 11.5 g/dL — ABNORMAL LOW (ref 12.0–15.0)
Lymphocytes Relative: 24.6 % (ref 12.0–46.0)
Lymphs Abs: 1.7 10*3/uL (ref 0.7–4.0)
MCHC: 32.9 g/dL (ref 30.0–36.0)
MCV: 84 fl (ref 78.0–100.0)
Monocytes Absolute: 0.5 10*3/uL (ref 0.1–1.0)
Monocytes Relative: 8 % (ref 3.0–12.0)
Neutro Abs: 4.4 10*3/uL (ref 1.4–7.7)
Neutrophils Relative %: 64.6 % (ref 43.0–77.0)
Platelets: 274 10*3/uL (ref 150.0–400.0)
RBC: 4.16 Mil/uL (ref 3.87–5.11)
RDW: 15.7 % — ABNORMAL HIGH (ref 11.5–15.5)
WBC: 6.8 10*3/uL (ref 4.0–10.5)

## 2021-01-23 LAB — TSH: TSH: 0.3 u[IU]/mL — ABNORMAL LOW (ref 0.35–4.50)

## 2021-01-23 LAB — LIPID PANEL
Cholesterol: 164 mg/dL (ref 0–200)
HDL: 42.6 mg/dL (ref >39.00)
LDL Cholesterol: 99 mg/dL (ref 0–99)
NonHDL: 121.12
Total CHOL/HDL Ratio: 4
Triglycerides: 111 mg/dL (ref 0.0–149.0)
VLDL: 22.2 mg/dL (ref 0.0–40.0)

## 2021-01-23 LAB — T3, FREE: T3, Free: 2.7 pg/mL (ref 2.3–4.2)

## 2021-01-23 LAB — HEPATITIS C ANTIBODY
Hepatitis C Ab: NONREACTIVE
SIGNAL TO CUT-OFF: 0.03 (ref <1.00)

## 2021-01-23 LAB — T4, FREE: Free T4: 1.12 ng/dL (ref 0.60–1.60)

## 2021-01-23 LAB — RPR: RPR Ser Ql: NONREACTIVE

## 2021-01-23 LAB — VITAMIN D 25 HYDROXY (VIT D DEFICIENCY, FRACTURES): VITD: 39.71 ng/mL (ref 30.00–100.00)

## 2021-01-23 LAB — HIV ANTIBODY (ROUTINE TESTING W REFLEX): HIV 1&2 Ab, 4th Generation: NONREACTIVE

## 2021-01-24 LAB — CYTOLOGY - PAP
Comment: NEGATIVE
Diagnosis: NEGATIVE
High risk HPV: NEGATIVE

## 2021-01-24 LAB — CERVICOVAGINAL ANCILLARY ONLY
Chlamydia: NEGATIVE
Comment: NEGATIVE
Comment: NEGATIVE
Comment: NORMAL
Neisseria Gonorrhea: NEGATIVE
Trichomonas: NEGATIVE

## 2021-02-19 ENCOUNTER — Other Ambulatory Visit (HOSPITAL_COMMUNITY): Payer: Self-pay

## 2021-02-19 MED FILL — Levothyroxine Sodium Tab 137 MCG: ORAL | 90 days supply | Qty: 90 | Fill #0 | Status: AC

## 2021-03-18 ENCOUNTER — Other Ambulatory Visit (HOSPITAL_COMMUNITY): Payer: Self-pay

## 2021-03-18 MED FILL — Escitalopram Oxalate Tab 10 MG (Base Equiv): ORAL | 90 days supply | Qty: 90 | Fill #0 | Status: AC

## 2021-03-19 ENCOUNTER — Other Ambulatory Visit (HOSPITAL_COMMUNITY): Payer: Self-pay

## 2021-04-04 ENCOUNTER — Other Ambulatory Visit (HOSPITAL_COMMUNITY): Payer: Self-pay

## 2021-04-04 MED ORDER — CARESTART COVID-19 HOME TEST VI KIT
PACK | 0 refills | Status: DC
  Filled 2021-04-04: qty 4, 4d supply, fill #0

## 2021-04-05 ENCOUNTER — Encounter: Payer: Self-pay | Admitting: Physician Assistant

## 2021-04-05 ENCOUNTER — Other Ambulatory Visit (HOSPITAL_COMMUNITY): Payer: Self-pay

## 2021-04-05 NOTE — Telephone Encounter (Signed)
Please call pt and schedule a office visit. She will need to be seen in order for [place] to place referral for foot pain.

## 2021-04-05 NOTE — Telephone Encounter (Signed)
Dr.Kramer was okay to see patient, and has been seen today. Patient placed referral with [facility], would like noted in chart that PCP is okay with the referral.

## 2021-04-15 NOTE — Telephone Encounter (Signed)
Noted  

## 2021-05-21 ENCOUNTER — Other Ambulatory Visit (HOSPITAL_COMMUNITY): Payer: Self-pay

## 2021-05-21 MED FILL — Levothyroxine Sodium Tab 137 MCG: ORAL | 90 days supply | Qty: 90 | Fill #1 | Status: AC

## 2021-06-13 ENCOUNTER — Other Ambulatory Visit (HOSPITAL_COMMUNITY): Payer: Self-pay

## 2021-06-13 MED FILL — Escitalopram Oxalate Tab 10 MG (Base Equiv): ORAL | 90 days supply | Qty: 90 | Fill #1 | Status: AC

## 2021-08-13 ENCOUNTER — Other Ambulatory Visit (HOSPITAL_COMMUNITY): Payer: Self-pay

## 2021-08-13 MED FILL — Levothyroxine Sodium Tab 137 MCG: ORAL | 90 days supply | Qty: 90 | Fill #2 | Status: AC

## 2021-09-16 ENCOUNTER — Other Ambulatory Visit (HOSPITAL_COMMUNITY): Payer: Self-pay

## 2021-09-16 ENCOUNTER — Other Ambulatory Visit: Payer: Self-pay | Admitting: Physician Assistant

## 2021-09-16 DIAGNOSIS — F411 Generalized anxiety disorder: Secondary | ICD-10-CM

## 2021-09-16 MED ORDER — ESCITALOPRAM OXALATE 10 MG PO TABS
10.0000 mg | ORAL_TABLET | Freq: Every day | ORAL | 0 refills | Status: DC
  Filled 2021-09-16: qty 90, 90d supply, fill #0

## 2021-11-13 ENCOUNTER — Other Ambulatory Visit (HOSPITAL_COMMUNITY): Payer: Self-pay

## 2021-11-13 ENCOUNTER — Other Ambulatory Visit: Payer: Self-pay | Admitting: Physician Assistant

## 2021-11-13 MED ORDER — LEVOTHYROXINE SODIUM 137 MCG PO TABS
137.0000 ug | ORAL_TABLET | Freq: Every day | ORAL | 0 refills | Status: DC
  Filled 2021-11-13: qty 90, 90d supply, fill #0

## 2021-12-17 ENCOUNTER — Other Ambulatory Visit: Payer: Self-pay | Admitting: Physician Assistant

## 2021-12-17 ENCOUNTER — Other Ambulatory Visit (HOSPITAL_COMMUNITY): Payer: Self-pay

## 2021-12-17 DIAGNOSIS — F411 Generalized anxiety disorder: Secondary | ICD-10-CM

## 2021-12-17 MED ORDER — ESCITALOPRAM OXALATE 10 MG PO TABS
10.0000 mg | ORAL_TABLET | Freq: Every day | ORAL | 0 refills | Status: DC
  Filled 2021-12-17: qty 30, 30d supply, fill #0

## 2021-12-30 DIAGNOSIS — Z0289 Encounter for other administrative examinations: Secondary | ICD-10-CM

## 2022-01-02 ENCOUNTER — Encounter (INDEPENDENT_AMBULATORY_CARE_PROVIDER_SITE_OTHER): Payer: Self-pay | Admitting: Bariatrics

## 2022-01-02 ENCOUNTER — Ambulatory Visit (INDEPENDENT_AMBULATORY_CARE_PROVIDER_SITE_OTHER): Payer: No Typology Code available for payment source | Admitting: Bariatrics

## 2022-01-02 VITALS — BP 125/86 | HR 82 | Temp 98.7°F | Ht 64.0 in | Wt 294.0 lb

## 2022-01-02 DIAGNOSIS — E039 Hypothyroidism, unspecified: Secondary | ICD-10-CM | POA: Diagnosis not present

## 2022-01-02 DIAGNOSIS — R5383 Other fatigue: Secondary | ICD-10-CM | POA: Diagnosis not present

## 2022-01-02 DIAGNOSIS — R0602 Shortness of breath: Secondary | ICD-10-CM | POA: Diagnosis not present

## 2022-01-02 DIAGNOSIS — E668 Other obesity: Secondary | ICD-10-CM

## 2022-01-02 DIAGNOSIS — D508 Other iron deficiency anemias: Secondary | ICD-10-CM

## 2022-01-02 DIAGNOSIS — Z1331 Encounter for screening for depression: Secondary | ICD-10-CM

## 2022-01-02 DIAGNOSIS — R7309 Other abnormal glucose: Secondary | ICD-10-CM | POA: Diagnosis not present

## 2022-01-02 DIAGNOSIS — E559 Vitamin D deficiency, unspecified: Secondary | ICD-10-CM | POA: Diagnosis not present

## 2022-01-02 DIAGNOSIS — Z6841 Body Mass Index (BMI) 40.0 and over, adult: Secondary | ICD-10-CM

## 2022-01-02 NOTE — Progress Notes (Signed)
? ? ? ?Chief Complaint:  ? ?OBESITY ?Lisa Bolton (MR# [id number]) is a 36 y.o. female who presents for evaluation and treatment of obesity and related comorbidities. Current BMI is Body mass index is 50.46 kg/m?Lisa Bolton has been struggling with her weight for many years and has been unsuccessful in either losing weight, maintaining weight loss, or reaching her healthy weight goal. ? ?Analiz sometimes like to cook, but notes the obstacles of cleaning the kitchen. ? ?[name] is currently in the action stage of change and ready to dedicate time achieving and maintaining a healthier weight. [name] is interested in becoming our patient and working on intensive lifestyle modifications including (but not limited to) diet and exercise for weight loss. ? ?[name]'s habits were reviewed today and are as follows: she struggles with family and or coworkers weight loss sabotage, her desired weight loss is 114 pounds, she has been heavy most of her life, she started gaining weight in college, her heaviest weight ever was 305 pounds, she has significant food cravings issues, she skips meals frequently, she is frequently drinking liquids with calories, she frequently makes poor food choices, she frequently eats larger portions than normal, she has binge eating behaviors, and she struggles with emotional eating. ? ?Depression Screen ?[name]'s Food and Mood (modified PHQ-9) score was 14. ? ? ?  01/02/2022  ?  8:47 AM  ?Depression screen PHQ 2/9  ?Decreased Interest 1  ?Down, Depressed, Hopeless 1  ?PHQ - 2 Score 2  ?Altered sleeping 1  ?Tired, decreased energy 3  ?Change in appetite 1  ?Feeling bad or failure about yourself  3  ?Trouble concentrating 1  ?Moving slowly or fidgety/restless 3  ?Suicidal thoughts 0  ?PHQ-9 Score 14  ?Difficult doing work/chores Somewhat difficult  ? ?Subjective:  ? ?1. Other fatigue ?[name] will continue activities. [name] admits to daytime somnolence and admits to waking up still tired. Patient has  a history of symptoms of daytime fatigue and morning fatigue. Lisa Bolton generally gets  7-9  hours of sleep per night, and states that she has difficulty falling back asleep if awakened and generally restful sleep. Snoring is present. Apneic episodes is present. Epworth Sleepiness Score is 16.   ? ?2. SOB (shortness of breath) on exertion ?Lisa Bolton will continue activities. Dimples notes increasing shortness of breath with exercising and seems to be worsening over time with weight gain. She notes getting out of breath sooner with activity than she used to. This has not gotten worse recently. Lisa Bolton denies shortness of breath at rest or orthopnea.  ? ?3. Acquired hypothyroidism ?Lisa Bolton is currently taking Synthroid.  ? ?4. Vitamin D deficiency ?Marquesa is currently taking multivitamin [name]). ? ?5. Other iron deficiency anemia ?Lisa Bolton is not on iron currently.  ? ?6. Elevated glucose ?We discussed elevated glucose today.  ? ?Assessment/Plan:  ? ?1. Other fatigue ?Lisa Bolton will gradually increase exercise and activity. We will review EKG today. We will check TSH today. [name] does feel that her weight is causing her energy to be lower than it should be. Fatigue may be related to obesity, depression or many other causes. Labs will be ordered, and in the meanwhile, Ludene will focus on self care including making healthy food choices, increasing physical activity and focusing on stress reduction.  ? ?- EKG 12-Lead ?- TSH+T4F+T3Free ? ?2. SOB (shortness of breath) on exertion ?Lisa Bolton will gradually increase exercise and activity. We will check TSH today. Lisa Bolton does feel that she gets out of breath more easily  that she used to when she exercises. Lisa Bolton's shortness of breath appears to be obesity related and exercise induced. She has agreed to work on weight loss and gradually increase exercise to treat her exercise induced shortness of breath. Will continue to monitor closely.  ? ?- TSH+T4F+T3Free ? ?3. Acquired  hypothyroidism ?Lisa Bolton will continue taking Synthroid. We will check CMP today. Orders and follow up as documented in patient record. ? ?Counseling ?Good thyroid control is important for overall health. Supratherapeutic thyroid levels are dangerous and will not improve weight loss results. ?Counseling: The correct way to take levothyroxine is fasting, with water, separated by at least 30 minutes from breakfast, and separated by more than 4 hours from calcium, iron, multivitamins, acid reflux medications (PPIs).    ? ?- Comprehensive metabolic panel ? ?4. Vitamin D deficiency ?Low Vitamin D level contributes to fatigue and are associated with obesity, breast, and colon cancer. We will check Vitamin D today and Lisa Bolton will follow-up for routine testing of Vitamin D, at least 2-3 times per year to avoid over-replacement. ? ?- VITAMIN D 25 Hydroxy (Vit-D Deficiency, Fractures) ? ?5. Other iron deficiency anemia ?Orders and follow up as documented in patient record. We will check anemia panel and ferritin today.  ? ?Counseling ?Iron is essential for our bodies to make red blood cells.  Reasons that someone may be deficient include: an iron-deficient diet (more likely in those following vegan or vegetarian diets), women with heavy menses, patients with GI disorders or poor absorption, patients that have had bariatric surgery, frequent blood donors, patients with cancer, and patients with heart disease.   ?An iron supplement has been recommended. This is found over-the-counter.   ?Iron-rich foods include dark leafy greens, red and white meats, eggs, seafood, and beans.   ?Certain foods and drinks prevent your body from absorbing iron properly. Avoid eating these foods in the same meal as iron-rich foods or with iron supplements. These foods include: coffee, black tea, and red wine; milk, dairy products, and foods that are high in calcium; beans and soybeans; whole grains.  ?Constipation can be a side effect of iron  supplementation. Increased water and fiber intake are helpful. Water goal: > 2 liters/day. Fiber goal: > 25 grams/day.  ? ?- Lipid Panel With LDL/HDL Ratio ?- Anemia panel ?- Ferritin ? ?6. Elevated glucose ?We will check Hemoglobin A1C and insulin today. ? ?- Insulin, random ?- Hemoglobin A1c ?- Comprehensive metabolic panel ? ?7. Depression screen ?Darcus had a positive depression screening. Depression is commonly associated with obesity and often results in emotional eating behaviors. We will monitor this closely and work on CBT to help improve the non-hunger eating patterns. Referral to Psychology may be required if no improvement is seen as she continues in our clinic.  ? ?8. Class 3 severe obesity with serious comorbidity and body mass index (BMI) of 50.0 to 59.9 in adult, unspecified obesity type ([place]) ?Lind is currently in the action stage of change and her goal is to continue with weight loss efforts. I recommend Sherlonda begin the structured treatment plan as follows: ? ?She has agreed to the Category 3 Plan. ? ?Reem will continue meal planning and she will continue intentional eating.  ? ?Exercise goals: No exercise has been prescribed at this time.  ? ?Behavioral modification strategies: increasing lean protein intake, decreasing simple carbohydrates, increasing vegetables, increasing water intake, decreasing eating out, no skipping meals, meal planning and cooking strategies, keeping healthy foods in the home, and planning for  success. ? ?She was informed of the importance of frequent follow-up visits to maximize her success with intensive lifestyle modifications for her multiple health conditions. She was informed we would discuss her lab results at her next visit unless there is a critical issue that needs to be addressed sooner. Brenley agreed to keep her next visit at the agreed upon time to discuss these results. ? ?Objective:  ? ?Blood pressure 125/86, pulse 82, temperature 98.7 ?F (37.1 ?C),  height '5\' 4"'$  (1.626 m), weight 294 lb (133.4 kg), last menstrual period 12/16/2021, SpO2 98 %. Body mass index is 50.46 kg/m?. ? ?EKG: Normal sinus rhythm, rate 88 bpm. ? ?Indirect Calorimeter completed today shows a VO2

## 2022-01-03 ENCOUNTER — Encounter: Payer: Self-pay | Admitting: Physician Assistant

## 2022-01-03 LAB — COMPREHENSIVE METABOLIC PANEL
ALT: 29 IU/L (ref 0–32)
AST: 20 IU/L (ref 0–40)
Albumin/Globulin Ratio: 1.6 (ref 1.2–2.2)
Albumin: 4.3 g/dL (ref 3.8–4.8)
Alkaline Phosphatase: 87 IU/L (ref 44–121)
BUN/Creatinine Ratio: 20 (ref 9–23)
BUN: 13 mg/dL (ref 6–20)
Bilirubin Total: 0.2 mg/dL (ref 0.0–1.2)
CO2: 22 mmol/L (ref 20–29)
Calcium: 9.4 mg/dL (ref 8.7–10.2)
Chloride: 103 mmol/L (ref 96–106)
Creatinine, Ser: 0.65 mg/dL (ref 0.57–1.00)
Globulin, Total: 2.7 g/dL (ref 1.5–4.5)
Glucose: 86 mg/dL (ref 70–99)
Potassium: 4.4 mmol/L (ref 3.5–5.2)
Sodium: 138 mmol/L (ref 134–144)
Total Protein: 7 g/dL (ref 6.0–8.5)
eGFR: 116 mL/min/{1.73_m2} (ref >59)

## 2022-01-03 LAB — LIPID PANEL WITH LDL/HDL RATIO
Cholesterol, Total: 201 mg/dL — ABNORMAL HIGH (ref 100–199)
HDL: 44 mg/dL (ref >39)
LDL Chol Calc (NIH): 139 mg/dL — ABNORMAL HIGH (ref 0–99)
LDL/HDL Ratio: 3.2 ratio (ref 0.0–3.2)
Triglycerides: 100 mg/dL (ref 0–149)
VLDL Cholesterol Cal: 18 mg/dL (ref 5–40)

## 2022-01-03 LAB — ANEMIA PANEL
Ferritin: 24 ng/mL (ref 15–150)
Folate, Hemolysate: >620 ng/mL
Folate, RBC: >1466 ng/mL (ref >498)
Hematocrit: 42.3 % (ref 34.0–46.6)
Iron Saturation: 16 % (ref 15–55)
Iron: 67 ug/dL (ref 27–159)
Retic Ct Pct: 1.3 % (ref 0.6–2.6)
Total Iron Binding Capacity: 418 ug/dL (ref 250–450)
UIBC: 351 ug/dL (ref 131–425)
Vitamin B-12: 623 pg/mL (ref 232–1245)

## 2022-01-03 LAB — INSULIN, RANDOM: INSULIN: 9.3 u[IU]/mL (ref 2.6–24.9)

## 2022-01-03 LAB — TSH+T4F+T3FREE
Free T4: 1.33 ng/dL (ref 0.82–1.77)
T3, Free: 3.1 pg/mL (ref 2.0–4.4)
TSH: 1.37 u[IU]/mL (ref 0.450–4.500)

## 2022-01-03 LAB — HEMOGLOBIN A1C
Est. average glucose Bld gHb Est-mCnc: 105 mg/dL
Hgb A1c MFr Bld: 5.3 % (ref 4.8–5.6)

## 2022-01-03 LAB — VITAMIN D 25 HYDROXY (VIT D DEFICIENCY, FRACTURES): Vit D, 25-Hydroxy: 30.6 ng/mL (ref 30.0–100.0)

## 2022-01-06 ENCOUNTER — Other Ambulatory Visit: Payer: Self-pay | Admitting: Physician Assistant

## 2022-01-06 ENCOUNTER — Encounter (INDEPENDENT_AMBULATORY_CARE_PROVIDER_SITE_OTHER): Payer: Self-pay | Admitting: Bariatrics

## 2022-01-06 NOTE — Telephone Encounter (Signed)
Please advise 

## 2022-01-16 ENCOUNTER — Encounter (INDEPENDENT_AMBULATORY_CARE_PROVIDER_SITE_OTHER): Payer: Self-pay | Admitting: Bariatrics

## 2022-01-16 ENCOUNTER — Ambulatory Visit (INDEPENDENT_AMBULATORY_CARE_PROVIDER_SITE_OTHER): Payer: No Typology Code available for payment source | Admitting: Bariatrics

## 2022-01-16 ENCOUNTER — Other Ambulatory Visit (HOSPITAL_COMMUNITY): Payer: Self-pay

## 2022-01-16 ENCOUNTER — Other Ambulatory Visit: Payer: Self-pay | Admitting: Physician Assistant

## 2022-01-16 VITALS — BP 136/85 | HR 68 | Temp 98.0°F | Ht 64.0 in | Wt 286.0 lb

## 2022-01-16 DIAGNOSIS — E65 Localized adiposity: Secondary | ICD-10-CM

## 2022-01-16 DIAGNOSIS — E7800 Pure hypercholesterolemia, unspecified: Secondary | ICD-10-CM | POA: Diagnosis not present

## 2022-01-16 DIAGNOSIS — E669 Obesity, unspecified: Secondary | ICD-10-CM

## 2022-01-16 DIAGNOSIS — E559 Vitamin D deficiency, unspecified: Secondary | ICD-10-CM

## 2022-01-16 DIAGNOSIS — Z6841 Body Mass Index (BMI) 40.0 and over, adult: Secondary | ICD-10-CM

## 2022-01-16 DIAGNOSIS — F411 Generalized anxiety disorder: Secondary | ICD-10-CM

## 2022-01-16 MED ORDER — VITAMIN D (ERGOCALCIFEROL) 1.25 MG (50000 UNIT) PO CAPS
50000.0000 [IU] | ORAL_CAPSULE | ORAL | 0 refills | Status: DC
  Filled 2022-01-16: qty 4, 28d supply, fill #0

## 2022-01-16 MED ORDER — ESCITALOPRAM OXALATE 10 MG PO TABS
10.0000 mg | ORAL_TABLET | Freq: Every day | ORAL | 0 refills | Status: DC
  Filled 2022-01-16: qty 30, 30d supply, fill #0

## 2022-01-27 ENCOUNTER — Encounter: Payer: Self-pay | Admitting: Physician Assistant

## 2022-01-27 ENCOUNTER — Ambulatory Visit (INDEPENDENT_AMBULATORY_CARE_PROVIDER_SITE_OTHER): Payer: No Typology Code available for payment source | Admitting: Physician Assistant

## 2022-01-27 ENCOUNTER — Other Ambulatory Visit (HOSPITAL_COMMUNITY): Payer: Self-pay

## 2022-01-27 VITALS — BP 124/80 | HR 64 | Temp 97.9°F | Ht 64.0 in | Wt 288.2 lb

## 2022-01-27 DIAGNOSIS — L282 Other prurigo: Secondary | ICD-10-CM | POA: Diagnosis not present

## 2022-01-27 DIAGNOSIS — F411 Generalized anxiety disorder: Secondary | ICD-10-CM

## 2022-01-27 DIAGNOSIS — Z0000 Encounter for general adult medical examination without abnormal findings: Secondary | ICD-10-CM | POA: Diagnosis not present

## 2022-01-27 DIAGNOSIS — E669 Obesity, unspecified: Secondary | ICD-10-CM | POA: Diagnosis not present

## 2022-01-27 DIAGNOSIS — E559 Vitamin D deficiency, unspecified: Secondary | ICD-10-CM

## 2022-01-27 DIAGNOSIS — E039 Hypothyroidism, unspecified: Secondary | ICD-10-CM

## 2022-01-27 MED ORDER — CLONAZEPAM 0.5 MG PO TABS
0.5000 mg | ORAL_TABLET | Freq: Two times a day (BID) | ORAL | 1 refills | Status: DC | PRN
  Filled 2022-01-27: qty 30, 15d supply, fill #0

## 2022-01-27 MED ORDER — EPINEPHRINE 0.3 MG/0.3ML IJ SOAJ
INTRAMUSCULAR | 1 refills | Status: DC
  Filled 2022-01-27: qty 2, 1d supply, fill #0
  Filled 2022-06-27 – 2022-07-27 (×2): qty 2, 1d supply, fill #1

## 2022-01-27 MED ORDER — LEVOTHYROXINE SODIUM 137 MCG PO TABS
137.0000 ug | ORAL_TABLET | Freq: Every day | ORAL | 1 refills | Status: DC
  Filled 2022-01-27: qty 90, 90d supply, fill #0

## 2022-01-27 MED ORDER — ESCITALOPRAM OXALATE 10 MG PO TABS
10.0000 mg | ORAL_TABLET | Freq: Every day | ORAL | 0 refills | Status: DC
  Filled 2022-01-27: qty 30, 30d supply, fill #0

## 2022-01-27 NOTE — Progress Notes (Signed)
? ?Subjective:  ?  ?Lisa Bolton is a 36 y.o. female and is here for a comprehensive physical exam. ? ?HPI ? ?There are no preventive care reminders to display for this patient. ? ? ?Acute Concerns: ?Obesity  ?[name] recently began visiting [employer], bariatrics at the [facility] clinic. While she has been making sure to stay on a specific diet plan and increase her exercise bit by bit, she is not losing as much weight in the amount of time she expected. Pt also expressed how she feels the clinic is no longer providing her with what she needs. At this time she is interested in giving the clinic a chance before deciding if she wants me to take over care of her obesity/weight loss. Besides this, she shares that she is looking forward to finding an exercise group as well as get into weight lifting.  ? ? ?Chronic Issues: ?Hypothyroidism ?Lamya is currently compliant with taking synthroid 137 mcg daily with no complications. Although pt see Dr. Forde Dandy, endocrinology for this issue, she will need a refill from me today to make it to that appointment. At this time she is managing well. Denies brittle nails, heat/intolerance, hair loss, or unintentional weight changes.  ? ? ?Vitamin D Deficiency  ?Previously she was taking a daily multivitamin but following blood work on 03/230/23 by Jearld Lesch, healthy weight [facility] wellness, she was determined to need an additional boost. Due to this pt has started taking Drisdol 1.25 mg every 7 days with no complications. States that she instructed to take this for 4 weeks before re-checking levels. She has been managing well. Denies concerning sx.  ? ?Anxiety ?Pt is compliant with taking lexapro 10 mg daily and klonopin 0.5 mg twice daily as needed with no adverse effects. While she has not needed to take her klonopin, she would like to have it on hand just in case. She has been managing well with this regimen. Denies SI/HI.  ? ?Papular Urticaria ?While pt has  not needed to use her epinephrine 0.3 mg injection, she would still like to have this refilled today. She is managing well at this time.  ? ?Health Maintenance: ?Immunizations -- Covid- XNA;3557 ?Influenza- Due;2021 ?Tdap- Due ?PAP -- DUK;0254 ?Bone Density -- N/A ?Dentistry- UTD ?Ophthalmology- Will update  ?Diet -- Eats all food groups, has been following a meal plan by healthy weight and wellness ?Sleep habits -- No concerns  ?Exercise -- Walking daily; wanting to get into strength training  ?Current Weight -- Stable ?Weight History: ?Wt Readings from Last 10 Encounters:  ?01/27/22 288 lb 4 oz (130.7 kg)  ?01/16/22 286 lb (129.7 kg)  ?01/02/22 294 lb (133.4 kg)  ?01/22/21 282 lb 6.1 oz (128.1 kg)  ?10/24/20 277 lb (125.6 kg)  ?09/03/20 288 lb (130.6 kg)  ?07/23/20 283 lb (128.4 kg)  ?06/14/20 280 lb (127 kg)  ?06/13/20 281 lb (127.5 kg)  ?11/28/19 260 lb 4 oz (118 kg)  ? ?Body mass index is 49.48 kg/m?. ?Mood -- Stable ? ?Patient's last menstrual period was 01/13/2022 (exact date). ?Period characteristics --Normal  ?Birth control -- None reported.  ? ? ? reports current alcohol use of about 4.0 standard drinks per week.  ?Tobacco Use: Low Risk   ? Smoking Tobacco Use: Never  ? Smokeless Tobacco Use: Never  ? Passive Exposure: Not on file  ? ? ? ? ?  01/02/2022  ?  8:47 AM  ?Depression screen PHQ 2/9  ?Decreased Interest 1  ?  Down, Depressed, Hopeless 1  ?PHQ - 2 Score 2  ?Altered sleeping 1  ?Tired, decreased energy 3  ?Change in appetite 1  ?Feeling bad or failure about yourself  3  ?Trouble concentrating 1  ?Moving slowly or fidgety/restless 3  ?Suicidal thoughts 0  ?PHQ-9 Score 14  ?Difficult doing work/chores Somewhat difficult  ? ? ? ?Other providers/specialists: ?Patient Care Team: ?Inda Coke, PA as PCP - General (Physician Assistant) ?Reynold Bowen, MD as Consulting Physician (Endocrinology)  ? ?PMHx, SurgHx, SocialHx, Medications, and Allergies were reviewed in the Visit Navigator and updated as  appropriate.  ? ?Past Medical History:  ?Diagnosis Date  ? Anemia   ? Anxiety   ? Back pain   ? Chronic vaginitis   ? Constipation   ? Family history of anesthesia complication   ? UNCLE WITH  PSUEDOCHOLINESTEROSE  DEFIENCY  ? Food allergy   ? GERD (gastroesophageal reflux disease)   ? History of thyroid cancer   ? Malignant neoplasm of thyroid gland ([place]) 10/11/2012  ? Palpitations   ? Panic attacks   ? Recurrent boils, vulva   ? Vitamin D deficiency   ? ? ? ?Past Surgical History:  ?Procedure Laterality Date  ? ANTERIOR CRUCIATE LIGAMENT REPAIR Left 2001 - approximate  ? LYMPH NODE DISSECTION  10/29/2012  ? Procedure: LYMPH NODE DISSECTION;  Surgeon: Adin Hector, MD;  Location: [facility];  Service: General;  Laterality: N/A;  central compartment lymph node dissection   ? THYROID LOBECTOMY  09/21/2012  ? Procedure: THYROID LOBECTOMY;  Surgeon: Adin Hector, MD;  Location: [facility];  Service: General;  Laterality: Right;  Right Thyroid Lobectomy  ? THYROID LOBECTOMY  10/29/2012  ? Procedure: THYROID LOBECTOMY;  Surgeon: Adin Hector, MD;  Location: [facility];  Service: General;  Laterality: Left;  left thyroid lobectomy,central compartmental lymph node dissection  ? ? ? ?Family History  ?Problem Relation Age of Onset  ? Squamous cell carcinoma Mother   ?     skin  ? Hypertension Mother   ? Heart attack Mother   ? Multiple sclerosis Mother   ? Thyroid disease Mother   ? High Cholesterol Mother   ? Anxiety disorder Mother   ? Obesity Mother   ? Obesity Father   ? Anxiety disorder Brother   ? Diabetes type I Brother   ? Diabetes Mellitus I Brother   ? Arthritis Maternal Grandmother   ? Heart disease Paternal Grandmother   ? Parkinson's disease Paternal Grandmother   ? Heart attack Paternal Grandfather   ? Allergic rhinitis Neg Hx   ? Angioedema Neg Hx   ? Asthma Neg Hx   ? Eczema Neg Hx   ? Colon cancer Neg Hx   ? Breast cancer Neg Hx   ? ? ?Social History  ? ?Tobacco Use  ? Smoking status: Never  ? Smokeless tobacco:  Never  ?Vaping Use  ? Vaping Use: Never used  ?Substance Use Topics  ? Alcohol use: Yes  ?  Alcohol/week: 4.0 standard drinks  ?  Types: 2 Glasses of wine, 2 Cans of beer per week  ?  Comment: socially  ? Drug use: No  ? ? ?Review of Systems:  ? ?Review of Systems  ?Constitutional:  Negative for chills, fever, malaise/fatigue and weight loss.  ?HENT:  Negative for hearing loss, sinus pain and sore throat.   ?Respiratory:  Negative for cough and hemoptysis.   ?Cardiovascular:  Negative for chest pain, palpitations, leg swelling and  PND.  ?Gastrointestinal:  Negative for abdominal pain, constipation, diarrhea, heartburn, nausea and vomiting.  ?Genitourinary:  Negative for dysuria, frequency and urgency.  ?Musculoskeletal:  Negative for back pain, myalgias and neck pain.  ?Skin:  Negative for itching and rash.  ?Neurological:  Negative for dizziness, tingling, seizures and headaches.  ?Endo/Heme/Allergies:  Negative for polydipsia.  ?Psychiatric/Behavioral:  Negative for depression. The patient is not nervous/anxious.   ? ?Objective:  ? ?BP 124/80 (BP Location: Left Arm, Patient Position: Sitting, Cuff Size: Large)   Pulse 64   Temp 97.9 ?F (36.6 ?C) (Temporal)   Ht '5\' 4"'$  (1.626 m)   Wt 288 lb 4 oz (130.7 kg)   LMP 01/13/2022 (Exact Date)   SpO2 99%   BMI 49.48 kg/m?  ? ?General Appearance:    Alert, cooperative, no distress, appears stated age  ?Head:    Normocephalic, without obvious abnormality, atraumatic  ?Eyes:    PERRL, conjunctiva/corneas clear, EOM's intact, fundi  ?  benign, both eyes  ?Ears:    Normal TM's and external ear canals, both ears  ?Nose:   Nares normal, septum midline, mucosa normal, no drainage    or sinus tenderness  ?Throat:   Lips, mucosa, and tongue normal; teeth and gums normal  ?Neck:   Supple, symmetrical, trachea midline, no adenopathy;  ?  thyroid:  no enlargement/tenderness/nodules; no carotid ?  bruit or JVD  ?Back:     Symmetric, no curvature, ROM normal, no CVA tenderness   ?Lungs:     Clear to auscultation bilaterally, respirations unlabored  ?Chest Wall:    No tenderness or deformity  ? Heart:    Regular rate and rhythm, S1 and S2 normal, no murmur, rub   or gallop  ?Breast Exam:

## 2022-01-28 NOTE — Progress Notes (Signed)
? ? ? ?Chief Complaint:  ? ?OBESITY ?Lisa Bolton is here to discuss her progress with her obesity treatment plan along with follow-up of her obesity related diagnoses. Lisa Bolton is on the Category 3 Plan and states she is following her eating plan approximately 95% of the time. Lisa Bolton states she is walking for 30 minutes 2-3 times per week. ? ?Today's visit was #: 2 ?Starting weight: 294 lbs ?Starting date: 01/02/2022 ?Today's weight: 286 lbs ?Today's date: 01/16/2022 ?Total lbs lost to date: 8 lbs ?Total lbs lost since last in-office visit: 8 lbs ? ?Interim History: Lisa Bolton is down 8 lbs since her initial visit. She notes having a few challenges.  ? ?Subjective:  ? ?1. Visceral obesity ?Lisa Bolton's initial reading was 18. His normal visceral fat rating was greater than 13. ? ?2. Elevated cholesterol ?Lisa Bolton is not on medications currently.  ? ?3. Vitamin D insufficiency ?Lisa Bolton is currently taking multivitamins.  ? ?Assessment/Plan:  ? ?1. Visceral obesity ?Lisa Bolton will follow the plan closely.  ? ?2. Elevated cholesterol ?Cardiovascular risk and specific lipid/LDL goals reviewed.  Lisa Bolton will work on the plan and exercise. We discussed several lifestyle modifications today and Lisa Bolton will continue to work on diet, exercise and weight loss efforts. Orders and follow up as documented in patient record.  ? ?Counseling ?Intensive lifestyle modifications are the first line treatment for this issue. ?Dietary changes: Increase soluble fiber. Decrease simple carbohydrates. ?Exercise changes: Moderate to vigorous-intensity aerobic activity 150 minutes per week if tolerated. ?Lipid-lowering medications: see documented in medical record. ? ?3. Vitamin D insufficiency ?Low Vitamin D level contributes to fatigue and are associated with obesity, breast, and colon cancer. We will refill prescription Vitamin D 50,000 IU every week for 1 month with no refills and Lisa Bolton will follow-up for routine testing of Vitamin D, at least 2-3 times per  year to avoid over-replacement. ? ?- Vitamin D, Ergocalciferol, (DRISDOL) 1.25 MG (50000 UNIT) CAPS capsule; Take 1 capsule (50,000 Units total) by mouth every 7 (seven) days.  Dispense: 5 capsule; Refill: 0 ? ?4. Obesity, current BMI 49.2 ?Lisa Bolton is currently in the action stage of change. As such, her goal is to continue with weight loss efforts. She has agreed to the Category 3 Plan.  ? ?Lisa Bolton will continue meal planning and she will continue intentional eating. We reviewed labs from 01/02/2022 CMP, Lipid, iron, anemia panel, A1C, insulin and thyroid panel.  ? ?Exercise goals:  As is.  ? ?Behavioral modification strategies: increasing lean protein intake, decreasing simple carbohydrates, increasing vegetables, increasing water intake, decreasing eating out, no skipping meals, meal planning and cooking strategies, keeping healthy foods in the home, and planning for success. ? ?Lisa Bolton has agreed to follow-up with our clinic in 2-3 weeks with nurse practitioner and 5-6 weeks with myself. She was informed of the importance of frequent follow-up visits to maximize her success with intensive lifestyle modifications for her multiple health conditions.  ? ?Objective:  ? ?Blood pressure 136/85, pulse 68, temperature 98 ?F (36.7 ?C), height '5\' 4"'$  (1.626 m), weight 286 lb (129.7 kg), last menstrual period 01/13/2022, SpO2 97 %. ?Body mass index is 49.09 kg/m?. ? ?General: Cooperative, alert, well developed, in no acute distress. ?HEENT: Conjunctivae and lids unremarkable. ?Cardiovascular: Regular rhythm.  ?Lungs: Normal work of breathing. ?Neurologic: No focal deficits.  ? ?Lab Results  ?Component Value Date  ? CREATININE 0.65 01/02/2022  ? BUN 13 01/02/2022  ? NA 138 01/02/2022  ? K 4.4 01/02/2022  ? CL 103 01/02/2022  ?  CO2 22 01/02/2022  ? ?Lab Results  ?Component Value Date  ? ALT 29 01/02/2022  ? AST 20 01/02/2022  ? ALKPHOS 87 01/02/2022  ? BILITOT 0.2 01/02/2022  ? ?Lab Results  ?Component Value Date  ? HGBA1C 5.3  01/02/2022  ? HGBA1C 5.7 06/16/2018  ? ?Lab Results  ?Component Value Date  ? INSULIN 9.3 01/02/2022  ? ?Lab Results  ?Component Value Date  ? TSH 1.370 01/02/2022  ? ?Lab Results  ?Component Value Date  ? CHOL 201 (H) 01/02/2022  ? HDL 44 01/02/2022  ? LDLCALC 139 (H) 01/02/2022  ? TRIG 100 01/02/2022  ? CHOLHDL 4 01/22/2021  ? ?Lab Results  ?Component Value Date  ? VD25OH 30.6 01/02/2022  ? VD25OH 39.71 01/22/2021  ? VD25OH 31.97 11/28/2019  ? ?Lab Results  ?Component Value Date  ? WBC 6.8 01/22/2021  ? HGB 11.5 (L) 01/22/2021  ? HCT 42.3 01/02/2022  ? MCV 84.0 01/22/2021  ? PLT 274.0 01/22/2021  ? ?Lab Results  ?Component Value Date  ? IRON 67 01/02/2022  ? TIBC 418 01/02/2022  ? FERRITIN 24 01/02/2022  ? ?Attestation Statements:  ? ?Reviewed by clinician on day of visit: allergies, medications, problem list, medical history, surgical history, family history, social history, and previous encounter notes. ? ?I, Lizbeth Bark, RMA, am acting as transcriptionist for [employer], DO. ? ?I have reviewed the above documentation for accuracy and completeness, and I agree with the above. Jearld Lesch, DO ? ?

## 2022-01-29 ENCOUNTER — Encounter (INDEPENDENT_AMBULATORY_CARE_PROVIDER_SITE_OTHER): Payer: Self-pay | Admitting: Bariatrics

## 2022-01-29 ENCOUNTER — Other Ambulatory Visit (HOSPITAL_COMMUNITY): Payer: Self-pay

## 2022-02-13 ENCOUNTER — Encounter (INDEPENDENT_AMBULATORY_CARE_PROVIDER_SITE_OTHER): Payer: Self-pay | Admitting: Nurse Practitioner

## 2022-02-13 ENCOUNTER — Ambulatory Visit (INDEPENDENT_AMBULATORY_CARE_PROVIDER_SITE_OTHER): Payer: No Typology Code available for payment source | Admitting: Nurse Practitioner

## 2022-02-13 ENCOUNTER — Other Ambulatory Visit (HOSPITAL_COMMUNITY): Payer: Self-pay

## 2022-02-13 VITALS — BP 112/75 | HR 80 | Temp 97.8°F | Ht 64.0 in | Wt 285.0 lb

## 2022-02-13 DIAGNOSIS — E559 Vitamin D deficiency, unspecified: Secondary | ICD-10-CM | POA: Diagnosis not present

## 2022-02-13 DIAGNOSIS — E669 Obesity, unspecified: Secondary | ICD-10-CM

## 2022-02-13 DIAGNOSIS — E039 Hypothyroidism, unspecified: Secondary | ICD-10-CM

## 2022-02-13 DIAGNOSIS — E65 Localized adiposity: Secondary | ICD-10-CM

## 2022-02-13 DIAGNOSIS — Z6841 Body Mass Index (BMI) 40.0 and over, adult: Secondary | ICD-10-CM

## 2022-02-13 MED ORDER — LEVOTHYROXINE SODIUM 150 MCG PO TABS
150.0000 ug | ORAL_TABLET | ORAL | 3 refills | Status: DC
  Filled 2022-02-13: qty 90, 90d supply, fill #0

## 2022-02-13 MED ORDER — VITAMIN D (ERGOCALCIFEROL) 1.25 MG (50000 UNIT) PO CAPS
50000.0000 [IU] | ORAL_CAPSULE | ORAL | 0 refills | Status: DC
  Filled 2022-02-13: qty 4, 28d supply, fill #0

## 2022-02-17 ENCOUNTER — Telehealth: Payer: Self-pay | Admitting: Physician Assistant

## 2022-02-17 ENCOUNTER — Other Ambulatory Visit (HOSPITAL_COMMUNITY): Payer: Self-pay

## 2022-02-17 DIAGNOSIS — F411 Generalized anxiety disorder: Secondary | ICD-10-CM

## 2022-02-17 DIAGNOSIS — E65 Localized adiposity: Secondary | ICD-10-CM | POA: Insufficient documentation

## 2022-02-17 DIAGNOSIS — E559 Vitamin D deficiency, unspecified: Secondary | ICD-10-CM | POA: Insufficient documentation

## 2022-02-17 DIAGNOSIS — E039 Hypothyroidism, unspecified: Secondary | ICD-10-CM | POA: Insufficient documentation

## 2022-02-17 MED ORDER — LEVOTHYROXINE SODIUM 150 MCG PO TABS: 150.0000 ug | ORAL_TABLET | ORAL | 3 refills | Status: DC

## 2022-02-17 MED ORDER — ESCITALOPRAM OXALATE 10 MG PO TABS
10.0000 mg | ORAL_TABLET | Freq: Every day | ORAL | 1 refills | Status: DC
  Filled 2022-02-17: qty 90, 90d supply, fill #0
  Filled 2022-05-16: qty 90, 90d supply, fill #1

## 2022-02-17 NOTE — Telephone Encounter (Signed)
Pt states she was expecting more refills to be in the previous prescription, which is why she ran out. ?... ?Encourage patient to contact the pharmacy for refills or they can request refills through [facility] ? ?LAST APPOINTMENT DATE:   ?01/27/22 ? ?NEXT APPOINTMENT DATE: ?01/29/22 ? ?MEDICATION: ?Rx #: [id number]  ?escitalopram (LEXAPRO) 10 MG tablet [[id number]]  ? ?Is the patient out of medication?  ?Yes ? ?PHARMACY: ?[facility]  ?[address], [place] [address]  ?Phone:  [phone]  Fax:  [phone]  ?DEA #:  [id number] ? ?Let patient know to contact pharmacy at the end of the day to make sure medication is ready. ? ?Please notify patient to allow 48-72 hours to process  ?

## 2022-02-17 NOTE — Telephone Encounter (Signed)
Spoke to pt asked her if she wanted a 90 day supply? Pt said yes. Told her Rx for Lexapro sent to pharmacy now. Pt verbalized understanding. ?

## 2022-02-17 NOTE — Progress Notes (Signed)
? ? ? ?Chief Complaint:  ? ?OBESITY ?Lisa Bolton is here to discuss her progress with her obesity treatment plan along with follow-up of her obesity related diagnoses. Lisa Bolton is on the Category 3 Plan and states she is following her eating plan approximately 75% of the time. Lisa Bolton states she is walking 30 minutes 3-4 times per week. ? ?Today's visit was #: 3 ?Starting weight: 294 lbs ?Starting date: 01/02/2022 ?Today's weight: 285 lbs ?Today's date: 02/13/2022 ?Total lbs lost to date: 9 lbs ?Total lbs lost since last in-office visit: 1 lb ? ?Interim History: Lisa Bolton went on vacation since her last visit and did not follow her plan while on vacation.  She denies cravings or hunger.  For breakfast she has 2 eggs, bread, cheese, and fairlife milk.  She is drinking water and coffee daily.  For lunch she will have a sandwich with cheese, mustard, cottage cheese or greek yogurt and fruit.  For dinner:  protein not getting in enough vegetables.  ? ?Subjective:  ? ?1. Acquired hypothyroidism ?Lisa Bolton recently saw Endocrinology and Synthroid was increased to 150 mcg.  She plans to start that dose tomorrow.  She has a history of follicular thyroid cancer.  She did not have Medullary thyroid cancer.  Did discuss if she was a candidate for a GLP-1.  Endocrinology had no problem with her starting a GLP-1. ? ?2. Vitamin D insufficiency ?Lisa Bolton is taking prescription Vitamin D, 50,000 IU weekly.  Lisa Bolton denies any side effects of nausea, vomiting, or muscle weakness.   ? ? ?3. Visceral fat ?Lisa Bolton's visceral fat is improving.  Her initial reading was 18, today it was 14. ? ?Assessment/Plan:  ? ?1. Acquired hypothyroidism ?Lisa Bolton has agreed to start Synthroid 150 mcg, see below.  Also, she is to continue to follow up with Endocrinology. ? ?- levothyroxine (SYNTHROID) 150 MCG tablet; Take 1 tablet (150 mcg total) by mouth every morning on an empty stomach  Dispense: 90 tablet; Refill: 3 ? ?2. Vitamin D insufficiency ?Lisa Bolton has agreed to  continue prescription Vitamin D 50,000 IU weekly.  We will refill today.  See below.  ? ?Lab Results  ?Component Value Date  ? VD25OH 30.6 01/02/2022  ? VD25OH 39.71 01/22/2021  ? VD25OH 31.97 11/28/2019  ?  ? ?- Vitamin D, Ergocalciferol, (DRISDOL) 1.25 MG (50000 UNIT) CAPS capsule; Take 1 capsule (50,000 Units total) by mouth every 7 (seven) days.  Dispense: 4 capsule; Refill: 0 ? ?3. Visceral fat ?Continue working on dietary change, exercise and weight loss ? ?4. Obesity, current BMI 49.0 ?Lisa Bolton is currently in the action stage of change. As such, her goal is to continue with weight loss efforts. She has agreed to the Category 3 Plan.  ? ?Exercise goals:  As is, but has agreed to add water aerobics. ? ?Behavioral modification strategies: increasing water intake and meal planning and cooking strategies. ? ?Gaynor has agreed to follow-up with our clinic in 2 weeks. She was informed of the importance of frequent follow-up visits to maximize her success with intensive lifestyle modifications for her multiple health conditions.  ? ?Objective:  ? ?Blood pressure 112/75, pulse 80, temperature 97.8 ?F (36.6 ?C), height '5\' 4"'$  (1.626 m), weight 285 lb (129.3 kg), SpO2 97 %. ?Body mass index is 48.92 kg/m?. ? ?General: Cooperative, alert, well developed, in no acute distress. ?HEENT: Conjunctivae and lids unremarkable. ?Cardiovascular: Regular rhythm.  ?Lungs: Normal work of breathing. ?Neurologic: No focal deficits.  ? ?Lab Results  ?Component Value Date  ? CREATININE  0.65 01/02/2022  ? BUN 13 01/02/2022  ? NA 138 01/02/2022  ? K 4.4 01/02/2022  ? CL 103 01/02/2022  ? CO2 22 01/02/2022  ? ?Lab Results  ?Component Value Date  ? ALT 29 01/02/2022  ? AST 20 01/02/2022  ? ALKPHOS 87 01/02/2022  ? BILITOT 0.2 01/02/2022  ? ?Lab Results  ?Component Value Date  ? HGBA1C 5.3 01/02/2022  ? HGBA1C 5.7 06/16/2018  ? ?Lab Results  ?Component Value Date  ? INSULIN 9.3 01/02/2022  ? ?Lab Results  ?Component Value Date  ? TSH 1.370  01/02/2022  ? ?Lab Results  ?Component Value Date  ? CHOL 201 (H) 01/02/2022  ? HDL 44 01/02/2022  ? LDLCALC 139 (H) 01/02/2022  ? TRIG 100 01/02/2022  ? CHOLHDL 4 01/22/2021  ? ?Lab Results  ?Component Value Date  ? VD25OH 30.6 01/02/2022  ? VD25OH 39.71 01/22/2021  ? VD25OH 31.97 11/28/2019  ? ?Lab Results  ?Component Value Date  ? WBC 6.8 01/22/2021  ? HGB 11.5 (L) 01/22/2021  ? HCT 42.3 01/02/2022  ? MCV 84.0 01/22/2021  ? PLT 274.0 01/22/2021  ? ?Lab Results  ?Component Value Date  ? IRON 67 01/02/2022  ? TIBC 418 01/02/2022  ? FERRITIN 24 01/02/2022  ? ?Attestation Statements:  ? ?Reviewed by clinician on day of visit: allergies, medications, problem list, medical history, surgical history, family history, social history, and previous encounter notes. ? ?I, Davy Pique, RMA, am acting as Location manager for [employer], FNP-C. ? ?I have reviewed the above documentation for accuracy and completeness, and I agree with the above. Everardo Pacific, FNP  ?

## 2022-03-04 ENCOUNTER — Encounter (INDEPENDENT_AMBULATORY_CARE_PROVIDER_SITE_OTHER): Payer: Self-pay | Admitting: Bariatrics

## 2022-03-04 ENCOUNTER — Other Ambulatory Visit (HOSPITAL_COMMUNITY): Payer: Self-pay

## 2022-03-04 ENCOUNTER — Ambulatory Visit (INDEPENDENT_AMBULATORY_CARE_PROVIDER_SITE_OTHER): Payer: No Typology Code available for payment source | Admitting: Bariatrics

## 2022-03-04 VITALS — BP 135/95 | HR 69 | Temp 98.0°F | Ht 64.0 in | Wt 288.0 lb

## 2022-03-04 DIAGNOSIS — Z6841 Body Mass Index (BMI) 40.0 and over, adult: Secondary | ICD-10-CM

## 2022-03-04 DIAGNOSIS — E559 Vitamin D deficiency, unspecified: Secondary | ICD-10-CM | POA: Diagnosis not present

## 2022-03-04 DIAGNOSIS — E039 Hypothyroidism, unspecified: Secondary | ICD-10-CM | POA: Diagnosis not present

## 2022-03-04 DIAGNOSIS — E669 Obesity, unspecified: Secondary | ICD-10-CM | POA: Diagnosis not present

## 2022-03-04 MED ORDER — VITAMIN D (ERGOCALCIFEROL) 1.25 MG (50000 UNIT) PO CAPS
50000.0000 [IU] | ORAL_CAPSULE | ORAL | 0 refills | Status: DC
  Filled 2022-03-04: qty 4, 28d supply, fill #0

## 2022-03-06 NOTE — Progress Notes (Signed)
Chief Complaint:   OBESITY Lisa Bolton is here to discuss her progress with her obesity treatment plan along with follow-up of her obesity related diagnoses. Lisa Bolton is on the Category 3 Plan and states she is following her eating plan approximately 50% of the time. Lisa Bolton states she is doing 0 minutes 0 times per week.  Today's visit was #: 4 Starting weight: 294 lbs Starting date: 01/02/2022 Today's weight: 288 lbs Today's date: 03/04/2022 Total lbs lost to date: 6 lbs Total lbs lost since last in-office visit: 0  Interim History: Lisa Bolton is up 3 lbs since her last visit. She has been out of town and attended some celebrations.   Subjective:   1. Vitamin D insufficiency Lisa Bolton is taking Vitamin D currently.   2. Acquired hypothyroidism Lisa Bolton is currently taking Synthroid. She states her energy is good.   Assessment/Plan:   1. Vitamin D insufficiency Low Vitamin D level contributes to fatigue and are associated with obesity, breast, and colon cancer. Lisa Bolton agrees to continue to take prescription Vitamin D 50,000 IU every week for 1 month with no refills and Lisa Bolton will follow-up for routine testing of Vitamin D, at least 2-3 times per year to avoid over-replacement.  - Vitamin D, Ergocalciferol, (DRISDOL) 1.25 MG (50000 UNIT) CAPS capsule; Take 1 capsule (50,000 Units total) by mouth every 7 (seven) days.  Dispense: 4 capsule; Refill: 0  2. Acquired hypothyroidism Lisa Bolton will continue taking Synthroid. Orders and follow up as documented in patient record.  Counseling Good thyroid control is important for overall health. Supratherapeutic thyroid levels are dangerous and will not improve weight loss results. Counseling: The correct way to take levothyroxine is fasting, with water, separated by at least 30 minutes from breakfast, and separated by more than 4 hours from calcium, iron, multivitamins, acid reflux medications (PPIs).    3. Obesity, Current BMI 49.5 Lisa Bolton is  currently in the action stage of change. As such, her goal is to continue with weight loss efforts. She has agreed to the Category 3 Plan.   Lisa Bolton will continue meal planning and she will be mindful eating. She will keep water and protein high.   Exercise goals:  Lisa Bolton is walking the dog 30 minutes.   Behavioral modification strategies: increasing lean protein intake, decreasing simple carbohydrates, increasing vegetables, increasing water intake, decreasing eating out, no skipping meals, meal planning and cooking strategies, keeping healthy foods in the home, and planning for success.  Lisa Bolton has agreed to follow-up with our clinic in 2-3 weeks. She was informed of the importance of frequent follow-up visits to maximize her success with intensive lifestyle modifications for her multiple health conditions.   Objective:   Blood pressure (!) 135/95, pulse 69, temperature 98 F (36.7 C), height '5\' 4"'$  (1.626 m), weight 288 lb (130.6 kg), SpO2 97 %. Body mass index is 49.44 kg/m.  General: Cooperative, alert, well developed, in no acute distress. HEENT: Conjunctivae and lids unremarkable. Cardiovascular: Regular rhythm.  Lungs: Normal work of breathing. Neurologic: No focal deficits.   Lab Results  Component Value Date   CREATININE 0.65 01/02/2022   BUN 13 01/02/2022   NA 138 01/02/2022   K 4.4 01/02/2022   CL 103 01/02/2022   CO2 22 01/02/2022   Lab Results  Component Value Date   ALT 29 01/02/2022   AST 20 01/02/2022   ALKPHOS 87 01/02/2022   BILITOT 0.2 01/02/2022   Lab Results  Component Value Date   HGBA1C 5.3 01/02/2022  HGBA1C 5.7 06/16/2018   Lab Results  Component Value Date   INSULIN 9.3 01/02/2022   Lab Results  Component Value Date   TSH 1.370 01/02/2022   Lab Results  Component Value Date   CHOL 201 (H) 01/02/2022   HDL 44 01/02/2022   LDLCALC 139 (H) 01/02/2022   TRIG 100 01/02/2022   CHOLHDL 4 01/22/2021   Lab Results  Component Value Date    VD25OH 30.6 01/02/2022   VD25OH 39.71 01/22/2021   VD25OH 31.97 11/28/2019   Lab Results  Component Value Date   WBC 6.8 01/22/2021   HGB 11.5 (L) 01/22/2021   HCT 42.3 01/02/2022   MCV 84.0 01/22/2021   PLT 274.0 01/22/2021   Lab Results  Component Value Date   IRON 67 01/02/2022   TIBC 418 01/02/2022   FERRITIN 24 01/02/2022   Attestation Statements:   Reviewed by clinician on day of visit: allergies, medications, problem list, medical history, surgical history, family history, social history, and previous encounter notes.  I, Lizbeth Bark, RMA, am acting as Location manager for [employer], DO.  I have reviewed the above documentation for accuracy and completeness, and I agree with the above. Jearld Lesch, DO

## 2022-03-11 ENCOUNTER — Encounter (INDEPENDENT_AMBULATORY_CARE_PROVIDER_SITE_OTHER): Payer: Self-pay | Admitting: Bariatrics

## 2022-03-25 ENCOUNTER — Encounter (INDEPENDENT_AMBULATORY_CARE_PROVIDER_SITE_OTHER): Payer: Self-pay | Admitting: Bariatrics

## 2022-03-25 ENCOUNTER — Ambulatory Visit (INDEPENDENT_AMBULATORY_CARE_PROVIDER_SITE_OTHER): Payer: No Typology Code available for payment source | Admitting: Bariatrics

## 2022-03-25 ENCOUNTER — Other Ambulatory Visit (HOSPITAL_COMMUNITY): Payer: Self-pay

## 2022-03-25 VITALS — BP 132/84 | HR 80 | Temp 98.0°F | Ht 64.0 in | Wt 287.0 lb

## 2022-03-25 DIAGNOSIS — E668 Other obesity: Secondary | ICD-10-CM | POA: Diagnosis not present

## 2022-03-25 DIAGNOSIS — Z6841 Body Mass Index (BMI) 40.0 and over, adult: Secondary | ICD-10-CM | POA: Diagnosis not present

## 2022-03-25 DIAGNOSIS — E65 Localized adiposity: Secondary | ICD-10-CM

## 2022-03-25 DIAGNOSIS — E559 Vitamin D deficiency, unspecified: Secondary | ICD-10-CM

## 2022-03-25 MED ORDER — VITAMIN D (ERGOCALCIFEROL) 1.25 MG (50000 UNIT) PO CAPS
50000.0000 [IU] | ORAL_CAPSULE | ORAL | 0 refills | Status: DC
  Filled 2022-03-25 – 2022-04-03 (×2): qty 4, 28d supply, fill #0
  Filled 2022-06-05: qty 4, 28d supply, fill #1
  Filled 2022-06-27: qty 1, 7d supply, fill #1

## 2022-03-26 ENCOUNTER — Other Ambulatory Visit (HOSPITAL_COMMUNITY): Payer: Self-pay

## 2022-03-26 NOTE — Progress Notes (Unsigned)
Chief Complaint:   OBESITY Lisa Bolton is here to discuss her progress with her obesity treatment plan along with follow-up of her obesity related diagnoses. Lisa Bolton is on the Category 3 Plan and states she is following her eating plan approximately 25% of the time. Lisa Bolton states she is doing 0 minutes 0 times per week.  Today's visit was #: 5 Starting weight: 294 lbs Starting date: 01/02/2022 Today's weight: 287 lbs Today's date: 03/25/2022 Total lbs lost to date: 7 Total lbs lost since last in-office visit: 1  Interim History: Lisa Bolton is down 1 pound since her last visit.  She goes on vacation to the beach.  Subjective:   1. Vitamin D insufficiency Lisa Bolton is taking vitamin D prescription.  2. Visceral fat Lisa Bolton visceral fat rating is 17.  Assessment/Plan:   1. Vitamin D insufficiency Jasmon will continue prescription vitamin D, and we will refill for 1 month.  - Vitamin D, Ergocalciferol, (DRISDOL) 1.25 MG (50000 UNIT) CAPS capsule; Take 1 capsule (50,000 Units total) by mouth every 7 (seven) days.  Dispense: 5 capsule; Refill: 0  2. Visceral fat Lisa Bolton will continue to stay on her meal plan.  3. Obesity, Current BMI 49.4 Lisa Bolton is currently in the action stage of change. As such, her goal is to continue with weight loss efforts. She has agreed to the Category 3 Plan.   Meal planning and intentional eating were discussed.  Medication options for foods.  Increase fruits and vegetables.  Exercise goals: Will begin water aerobics.   Behavioral modification strategies: increasing lean protein intake, decreasing simple carbohydrates, increasing vegetables, increasing water intake, decreasing eating out, no skipping meals, meal planning and cooking strategies, keeping healthy foods in the home, and planning for success.  Lisa Bolton has agreed to follow-up with our clinic in 2 to 3 weeks. She was informed of the importance of frequent follow-up visits to maximize her success with  intensive lifestyle modifications for her multiple health conditions.   Objective:   Blood pressure 132/84, pulse 80, temperature 98 F (36.7 C), height '5\' 4"'$  (1.626 m), weight 287 lb (130.2 kg), SpO2 97 %. Body mass index is 49.26 kg/m.  General: Cooperative, alert, well developed, in no acute distress. HEENT: Conjunctivae and lids unremarkable. Cardiovascular: Regular rhythm.  Lungs: Normal work of breathing. Neurologic: No focal deficits.   Lab Results  Component Value Date   CREATININE 0.65 01/02/2022   BUN 13 01/02/2022   NA 138 01/02/2022   K 4.4 01/02/2022   CL 103 01/02/2022   CO2 22 01/02/2022   Lab Results  Component Value Date   ALT 29 01/02/2022   AST 20 01/02/2022   ALKPHOS 87 01/02/2022   BILITOT 0.2 01/02/2022   Lab Results  Component Value Date   HGBA1C 5.3 01/02/2022   HGBA1C 5.7 06/16/2018   Lab Results  Component Value Date   INSULIN 9.3 01/02/2022   Lab Results  Component Value Date   TSH 1.370 01/02/2022   Lab Results  Component Value Date   CHOL 201 (H) 01/02/2022   HDL 44 01/02/2022   LDLCALC 139 (H) 01/02/2022   TRIG 100 01/02/2022   CHOLHDL 4 01/22/2021   Lab Results  Component Value Date   VD25OH 30.6 01/02/2022   VD25OH 39.71 01/22/2021   VD25OH 31.97 11/28/2019   Lab Results  Component Value Date   WBC 6.8 01/22/2021   HGB 11.5 (L) 01/22/2021   HCT 42.3 01/02/2022   MCV 84.0 01/22/2021   PLT 274.0  01/22/2021   Lab Results  Component Value Date   IRON 67 01/02/2022   TIBC 418 01/02/2022   FERRITIN 24 01/02/2022   Attestation Statements:   Reviewed by clinician on day of visit: allergies, medications, problem list, medical history, surgical history, family history, social history, and previous encounter notes.   Wilhemena Durie, am acting as Location manager for [employer], DO.  I have reviewed the above documentation for accuracy and completeness, and I agree with the above. -  ***

## 2022-03-29 ENCOUNTER — Encounter (INDEPENDENT_AMBULATORY_CARE_PROVIDER_SITE_OTHER): Payer: Self-pay | Admitting: Bariatrics

## 2022-03-29 DIAGNOSIS — R748 Abnormal levels of other serum enzymes: Secondary | ICD-10-CM | POA: Insufficient documentation

## 2022-03-29 DIAGNOSIS — K601 Chronic anal fissure: Secondary | ICD-10-CM | POA: Insufficient documentation

## 2022-03-29 DIAGNOSIS — K219 Gastro-esophageal reflux disease without esophagitis: Secondary | ICD-10-CM | POA: Insufficient documentation

## 2022-04-03 ENCOUNTER — Other Ambulatory Visit (HOSPITAL_COMMUNITY): Payer: Self-pay

## 2022-04-22 ENCOUNTER — Ambulatory Visit (INDEPENDENT_AMBULATORY_CARE_PROVIDER_SITE_OTHER): Payer: No Typology Code available for payment source | Admitting: Bariatrics

## 2022-05-14 ENCOUNTER — Encounter (INDEPENDENT_AMBULATORY_CARE_PROVIDER_SITE_OTHER): Payer: Self-pay

## 2022-05-16 ENCOUNTER — Other Ambulatory Visit (HOSPITAL_COMMUNITY): Payer: Self-pay

## 2022-05-16 MED ORDER — LEVOTHYROXINE SODIUM 137 MCG PO TABS
137.0000 ug | ORAL_TABLET | Freq: Every day | ORAL | 3 refills | Status: DC
  Filled 2022-05-16: qty 90, 90d supply, fill #0

## 2022-05-19 ENCOUNTER — Other Ambulatory Visit (HOSPITAL_COMMUNITY): Payer: Self-pay

## 2022-05-20 ENCOUNTER — Other Ambulatory Visit (HOSPITAL_COMMUNITY): Payer: Self-pay

## 2022-05-21 ENCOUNTER — Other Ambulatory Visit (HOSPITAL_COMMUNITY): Payer: Self-pay

## 2022-05-22 ENCOUNTER — Other Ambulatory Visit (HOSPITAL_COMMUNITY): Payer: Self-pay

## 2022-05-22 MED ORDER — LEVOTHYROXINE SODIUM 150 MCG PO TABS
150.0000 ug | ORAL_TABLET | Freq: Every morning | ORAL | 3 refills | Status: DC
  Filled 2022-05-22: qty 90, 90d supply, fill #0
  Filled 2022-08-25: qty 90, 90d supply, fill #1
  Filled 2022-11-19: qty 90, 90d supply, fill #2
  Filled 2023-02-06: qty 90, 90d supply, fill #3

## 2022-05-27 ENCOUNTER — Other Ambulatory Visit (HOSPITAL_COMMUNITY): Payer: Self-pay

## 2022-05-27 MED ORDER — WEGOVY 0.25 MG/0.5ML ~~LOC~~ SOAJ
0.2500 mg | SUBCUTANEOUS | 1 refills | Status: DC
  Filled 2022-05-27 – 2022-06-30 (×3): qty 2, 28d supply, fill #0
  Filled 2022-07-27: qty 2, 28d supply, fill #1

## 2022-05-28 ENCOUNTER — Other Ambulatory Visit (HOSPITAL_COMMUNITY): Payer: Self-pay

## 2022-06-05 ENCOUNTER — Encounter: Payer: Self-pay | Admitting: Family

## 2022-06-05 ENCOUNTER — Telehealth (INDEPENDENT_AMBULATORY_CARE_PROVIDER_SITE_OTHER): Payer: No Typology Code available for payment source | Admitting: Family

## 2022-06-05 ENCOUNTER — Other Ambulatory Visit (HOSPITAL_COMMUNITY): Payer: Self-pay

## 2022-06-05 VITALS — Temp 101.0°F | Ht 64.0 in | Wt 292.0 lb

## 2022-06-05 DIAGNOSIS — U071 COVID-19: Secondary | ICD-10-CM | POA: Diagnosis not present

## 2022-06-05 MED ORDER — NIRMATRELVIR/RITONAVIR (PAXLOVID)TABLET
3.0000 | ORAL_TABLET | Freq: Two times a day (BID) | ORAL | 0 refills | Status: AC
  Filled 2022-06-05: qty 30, 5d supply, fill #0

## 2022-06-05 NOTE — Progress Notes (Signed)
MyChart Video Visit    Virtual Visit via Video Note   This format is felt to be most appropriate for this patient at this time. Physical exam was limited by quality of the video and audio technology used for the visit. CMA was able to get the patient set up on a video visit.  Patient location: Home. Patient and provider in visit Provider location: Office  I discussed the limitations of evaluation and management by telemedicine and the availability of in person appointments. The patient expressed understanding and agreed to proceed.  Visit Date: 06/05/2022  Today's healthcare provider: Jeanie Sewer, NP     Subjective:   Patient ID: Lisa Bolton, female    DOB: August 29, 1986, 36 y.o.   MRN: [id number]  Chief Complaint  Patient presents with   Covid Positive    Covid positive this morning, Headache, sore throat, body aches, fever 101.0, nasal congestion and drainage to back of throat. Has tried tylenol and and ibuprofen and Claritin which did help some with the headache.     HPI: Upper Respiratory Infection: Symptoms include  headache, fever, body aches, sore throat, nasal drainage & congestion .  Onset of symptoms was 1 day ago, gradually worsening since that time. She is drinking moderate amounts of fluids. Evaluation to date: none.  Treatment to date:  Claritin, Tylenol .   Assessment & Plan:   Problem List Items Addressed This Visit   None Visit Diagnoses     COVID-19    -  Primary Sending Paxlovid, pt advised of FDA label for emergency use, how to take, & SE. Advised of CDC guidelines for masking if out in public. OK to continue taking OTC sinus or pain meds. Encouraged to monitor & notify office of any worsening symptoms: increased shortness of breath, weakness, and signs of dehydration. Instructed to rest and hydrate well.     Relevant Medications   nirmatrelvir/ritonavir EUA (PAXLOVID) 20 x 150 MG & 10 x '100MG'$  TABS      Past Medical History:   Diagnosis Date   Anemia    Anxiety    Back pain    Chronic vaginitis    Constipation    Family history of anesthesia complication    UNCLE WITH  PSUEDOCHOLINESTEROSE  DEFIENCY   Food allergy    GERD (gastroesophageal reflux disease)    History of thyroid cancer    Malignant neoplasm of thyroid gland ([place]) 10/11/2012   Palpitations    Panic attacks    Recurrent boils, vulva    Vitamin D deficiency     Past Surgical History:  Procedure Laterality Date   ANTERIOR CRUCIATE LIGAMENT REPAIR Left 2001 - approximate   LYMPH NODE DISSECTION  10/29/2012   Procedure: LYMPH NODE DISSECTION;  Surgeon: Adin Hector, MD;  Location: [facility];  Service: General;  Laterality: N/A;  central compartment lymph node dissection    THYROID LOBECTOMY  09/21/2012   Procedure: THYROID LOBECTOMY;  Surgeon: Adin Hector, MD;  Location: [facility];  Service: General;  Laterality: Right;  Right Thyroid Lobectomy   THYROID LOBECTOMY  10/29/2012   Procedure: THYROID LOBECTOMY;  Surgeon: Adin Hector, MD;  Location: [facility];  Service: General;  Laterality: Left;  left thyroid lobectomy,central compartmental lymph node dissection    Outpatient Medications Prior to Visit  Medication Sig Dispense Refill   clonazePAM (KLONOPIN) 0.5 MG tablet Take 1 tablet (0.5 mg total) by mouth 2 (two) times daily as needed. for anxiety 30 tablet  1   EPINEPHrine 0.3 mg/0.3 mL IJ SOAJ injection INJECT AS DIRECTED FOR SEVERE ALLERGIC REACTION 2 each 1   escitalopram (LEXAPRO) 10 MG tablet Take 1 tablet (10 mg total) by mouth daily. 90 tablet 1   levothyroxine (SYNTHROID) 150 MCG tablet Take 1 tablet (150 mcg total) by mouth every morning on an empty stomach 90 tablet 3   levothyroxine (SYNTHROID) 150 MCG tablet Take 1 tablet (150 mcg total) by mouth in the morning on an empty stomach 90 tablet 3   levothyroxine (SYNTHROID) 150 MCG tablet 1 tablet in the morning on an empty stomach Orally Once a day for 90 days     loratadine  (CLARITIN) 10 MG tablet Take 10 mg by mouth daily.     Multiple Vitamin (MULTI-VITAMIN DAILY PO) Take 1 tablet by mouth daily in the afternoon.     Probiotic Product (PROBIOTIC-10) CAPS Take by mouth.     Vitamin D, Ergocalciferol, (DRISDOL) 1.25 MG (50000 UNIT) CAPS capsule Take 1 capsule (50,000 Units total) by mouth every 7 (seven) days. 5 capsule 0   levothyroxine (SYNTHROID) 137 MCG tablet Take 1 tablet (137 mcg total) by mouth daily. (Patient not taking: Reported on 06/05/2022) 90 tablet 3   Semaglutide-Weight Management (WEGOVY) 0.25 MG/0.5ML SOAJ Inject 0.25 mg into the skin once a week. (Patient not taking: Reported on 06/05/2022) 2 mL 1   No facility-administered medications prior to visit.    Allergies  Allergen Reactions   Shellfish Allergy    Other Other (See Comments)    Anethesia   Pertussis Vaccines        Objective:   Physical Exam Vitals and nursing note reviewed.  Constitutional:      General: She is not in acute distress.    Appearance: Normal appearance.  HENT:     Head: Normocephalic.  Pulmonary:     Effort: No respiratory distress.  Musculoskeletal:     Cervical back: Normal range of motion.  Skin:    General: Skin is dry.     Coloration: Skin is not pale.  Neurological:     Mental Status: She is alert and oriented to person, place, and time.  Psychiatric:        Mood and Affect: Mood normal.   Temp (!) 101 F (38.3 C) (Oral)   Ht '5\' 4"'$  (1.626 m)   Wt 292 lb (132.5 kg)   LMP 05/21/2022 (Approximate)   BMI 50.12 kg/m   Wt Readings from Last 3 Encounters:  06/05/22 292 lb (132.5 kg)  03/25/22 287 lb (130.2 kg)  03/04/22 288 lb (130.6 kg)       I discussed the assessment and treatment plan with the patient. The patient was provided an opportunity to ask questions and all were answered. The patient agreed with the plan and demonstrated an understanding of the instructions.   The patient was advised to call back or seek an in-person evaluation  if the symptoms worsen or if the condition fails to improve as anticipated.  Jeanie Sewer, NP [facility] [facility] [phone] (phone) [phone] (fax)  [place]

## 2022-06-27 ENCOUNTER — Other Ambulatory Visit (HOSPITAL_COMMUNITY): Payer: Self-pay

## 2022-06-30 ENCOUNTER — Other Ambulatory Visit (HOSPITAL_COMMUNITY): Payer: Self-pay

## 2022-06-30 ENCOUNTER — Other Ambulatory Visit (HOSPITAL_BASED_OUTPATIENT_CLINIC_OR_DEPARTMENT_OTHER): Payer: Self-pay

## 2022-06-30 ENCOUNTER — Encounter: Payer: Self-pay | Admitting: *Deleted

## 2022-06-30 MED ORDER — ERGOCALCIFEROL 1.25 MG (50000 UT) PO CAPS
1.2500 ug | ORAL_CAPSULE | ORAL | 0 refills | Status: DC
Start: 2022-06-29 — End: 2024-02-22
  Filled 2022-06-30 – 2022-07-27 (×2): qty 12, 84d supply, fill #0

## 2022-07-08 ENCOUNTER — Other Ambulatory Visit (HOSPITAL_COMMUNITY): Payer: Self-pay

## 2022-07-28 ENCOUNTER — Other Ambulatory Visit (HOSPITAL_BASED_OUTPATIENT_CLINIC_OR_DEPARTMENT_OTHER): Payer: Self-pay

## 2022-08-11 ENCOUNTER — Other Ambulatory Visit (HOSPITAL_COMMUNITY): Payer: Self-pay

## 2022-08-11 MED ORDER — WEGOVY 0.5 MG/0.5ML ~~LOC~~ SOAJ
0.5000 mg | SUBCUTANEOUS | 0 refills | Status: DC
  Filled 2022-08-11: qty 6, 84d supply, fill #0
  Filled 2022-08-26 – 2022-08-27 (×2): qty 2, 28d supply, fill #0

## 2022-08-13 ENCOUNTER — Other Ambulatory Visit (HOSPITAL_COMMUNITY): Payer: Self-pay

## 2022-08-13 ENCOUNTER — Other Ambulatory Visit: Payer: Self-pay | Admitting: Physician Assistant

## 2022-08-13 DIAGNOSIS — F411 Generalized anxiety disorder: Secondary | ICD-10-CM

## 2022-08-13 MED ORDER — ESCITALOPRAM OXALATE 10 MG PO TABS
10.0000 mg | ORAL_TABLET | Freq: Every day | ORAL | 1 refills | Status: DC
  Filled 2022-08-13: qty 90, 90d supply, fill #0
  Filled 2022-11-10: qty 90, 90d supply, fill #1

## 2022-08-26 ENCOUNTER — Other Ambulatory Visit (HOSPITAL_BASED_OUTPATIENT_CLINIC_OR_DEPARTMENT_OTHER): Payer: Self-pay

## 2022-08-27 ENCOUNTER — Other Ambulatory Visit (HOSPITAL_COMMUNITY): Payer: Self-pay

## 2022-08-27 ENCOUNTER — Other Ambulatory Visit (HOSPITAL_BASED_OUTPATIENT_CLINIC_OR_DEPARTMENT_OTHER): Payer: Self-pay

## 2022-09-18 ENCOUNTER — Encounter: Payer: Self-pay | Admitting: *Deleted

## 2022-09-23 ENCOUNTER — Other Ambulatory Visit (HOSPITAL_BASED_OUTPATIENT_CLINIC_OR_DEPARTMENT_OTHER): Payer: Self-pay

## 2022-09-23 ENCOUNTER — Telehealth: Payer: No Typology Code available for payment source | Admitting: Physician Assistant

## 2022-09-23 DIAGNOSIS — J069 Acute upper respiratory infection, unspecified: Secondary | ICD-10-CM

## 2022-09-23 MED ORDER — BENZONATATE 100 MG PO CAPS
100.0000 mg | ORAL_CAPSULE | Freq: Three times a day (TID) | ORAL | 0 refills | Status: DC | PRN
  Filled 2022-09-23: qty 30, 10d supply, fill #0

## 2022-09-23 NOTE — Progress Notes (Signed)
E-Visit for Upper Respiratory Infection   We are sorry you are not feeling well.  Here is how we plan to help!  Based on what you have shared with me, it looks like you may have a viral upper respiratory infection.  Upper respiratory infections are caused by a large number of viruses; however, rhinovirus is the most common cause.   Symptoms vary from person to person, with common symptoms including sore throat, cough, fatigue or lack of energy and feeling of general discomfort.  A low-grade fever of up to 100.4 may present, but is often uncommon.  Symptoms vary however, and are closely related to a person's age or underlying illnesses.  The most common symptoms associated with an upper respiratory infection are nasal discharge or congestion, cough, sneezing, headache and pressure in the ears and face.  These symptoms usually persist for about 3 to 10 days, but can last up to 2 weeks.  It is important to know that upper respiratory infections do not cause serious illness or complications in most cases.    Upper respiratory infections can be transmitted from person to person, with the most common method of transmission being a person's hands.  The virus is able to live on the skin and can infect other persons for up to 2 hours after direct contact.  Also, these can be transmitted when someone coughs or sneezes; thus, it is important to cover the mouth to reduce this risk.  To keep the spread of the illness at [place], good hand hygiene is very important.  This is an infection that is most likely caused by a virus. There are no specific treatments other than to help you with the symptoms until the infection runs its course.  We are sorry you are not feeling well.  Here is how we plan to help!   For nasal congestion, you may use plain Mucinex.  Saline nasal spray or nasal drops can help and can safely be used as often as needed for congestion.   If you do not have a history of heart disease, hypertension,  diabetes or thyroid disease, prostate/bladder issues or glaucoma, you may also use Sudafed to treat nasal congestion.  It is highly recommended that you consult with a pharmacist or your primary care physician to ensure this medication is safe for you to take.      If you have glaucoma or high blood pressure, you can also use Coricidin HBP.   For cough I have prescribed for you A prescription cough medication called Tessalon Perles 100 mg. You may take 1-2 capsules every 8 hours as needed for cough  If you have a sore or scratchy throat, use a saltwater gargle-  to  teaspoon of salt dissolved in a 4-ounce to 8-ounce glass of warm water.  Gargle the solution for approximately 15-30 seconds and then spit.  It is important not to swallow the solution.  You can also use throat lozenges/cough drops and Chloraseptic spray to help with throat pain or discomfort.  Warm or cold liquids can also be helpful in relieving throat pain.  For headache, pain or general discomfort, you can use Ibuprofen or Tylenol as directed.   Some authorities believe that zinc sprays or the use of Echinacea may shorten the course of your symptoms.   HOME CARE Only take medications as instructed by your medical team. Be sure to drink plenty of fluids. Water is fine as well as fruit juices, sodas and electrolyte beverages. You may  want to stay away from caffeine or alcohol. If you are nauseated, try taking small sips of liquids. How do you know if you are getting enough fluid? Your urine should be a pale yellow or almost colorless. Get rest. Taking a steamy shower or using a humidifier may help nasal congestion and ease sore throat pain. You can place a towel over your head and breathe in the steam from hot water coming from a faucet. Using a saline nasal spray works much the same way. Cough drops, hard candies and sore throat lozenges may ease your cough. Avoid close contacts especially the very young and the elderly Cover your  mouth if you cough or sneeze Always remember to wash your hands.   GET HELP RIGHT AWAY IF: You develop worsening fever. If your symptoms do not improve within 10 days You develop yellow or green discharge from your nose over 3 days. You have coughing fits You develop a severe head ache or visual changes. You develop shortness of breath, difficulty breathing or start having chest pain Your symptoms persist after you have completed your treatment plan  MAKE SURE YOU  Understand these instructions. Will watch your condition. Will get help right away if you are not doing well or get worse.  Thank you for choosing an e-visit.  Your e-visit answers were reviewed by a board certified advanced clinical practitioner to complete your personal care plan. Depending upon the condition, your plan could have included both over the counter or prescription medications.  Please review your pharmacy choice. Make sure the pharmacy is open so you can pick up prescription now. If there is a problem, you may contact your provider through [employer] and have the prescription routed to another pharmacy.  Your safety is important to [place]. If you have drug allergies check your prescription carefully.   For the next 24 hours you can use MyChart to ask questions about today's visit, request a non-urgent call back, or ask for a work or school excuse. You will get an email in the next two days asking about your experience. I hope that your e-visit has been valuable and will speed your recovery.

## 2022-09-23 NOTE — Progress Notes (Signed)
I have spent 5 minutes in review of e-visit questionnaire, review and updating patient chart, medical decision making and response to patient.   Jaimeson Gopal Cody Jaelon Gatley, PA-C    

## 2022-09-30 ENCOUNTER — Other Ambulatory Visit (HOSPITAL_BASED_OUTPATIENT_CLINIC_OR_DEPARTMENT_OTHER): Payer: Self-pay

## 2022-10-05 ENCOUNTER — Other Ambulatory Visit: Payer: Self-pay

## 2022-10-05 ENCOUNTER — Ambulatory Visit: Admit: 2022-10-05 | Payer: No Typology Code available for payment source

## 2022-10-05 ENCOUNTER — Encounter: Payer: Self-pay | Admitting: Physician Assistant

## 2022-10-05 ENCOUNTER — Telehealth: Payer: Self-pay | Admitting: Physician Assistant

## 2022-10-05 ENCOUNTER — Ambulatory Visit
Admission: EM | Admit: 2022-10-05 | Discharge: 2022-10-05 | Disposition: A | Discharge status: Home / Self Care | Payer: No Typology Code available for payment source | Attending: Physician Assistant | Admitting: Physician Assistant

## 2022-10-05 ENCOUNTER — Telehealth: Payer: Self-pay

## 2022-10-05 DIAGNOSIS — J40 Bronchitis, not specified as acute or chronic: Secondary | ICD-10-CM

## 2022-10-05 DIAGNOSIS — J329 Chronic sinusitis, unspecified: Secondary | ICD-10-CM | POA: Diagnosis not present

## 2022-10-05 LAB — POCT INFLUENZA A/B
Influenza A, POC: NEGATIVE
Influenza B, POC: NEGATIVE

## 2022-10-05 MED ORDER — DOXYCYCLINE HYCLATE 100 MG PO CAPS: 100.0000 mg | ORAL_CAPSULE | Freq: Two times a day (BID) | ORAL | 0 refills | Status: AC

## 2022-10-05 MED ORDER — PREDNISONE 20 MG PO TABS: 40.0000 mg | ORAL_TABLET | Freq: Every day | ORAL | 0 refills | Status: AC

## 2022-10-05 MED ORDER — AMOXICILLIN-POT CLAVULANATE 875-125 MG PO TABS: 1.0000 | ORAL_TABLET | Freq: Two times a day (BID) | ORAL | 0 refills | Status: DC

## 2022-10-05 NOTE — Telephone Encounter (Signed)
Pt called [facility] several hours ago to report that both her parents are allergic to Augmentin, and she would like her Rx from today's visit to be changed to Doxycycline if possible. Discussed w/ E. Raspet, PA at that time and she stated she would call in Doxycycline as requested; however she was just able to do this approx 20 minutes ago. TC to pt to inform of new Rx, but no answer. Left VM to inform.

## 2022-10-05 NOTE — ED Triage Notes (Signed)
Pt presents to Urgent Care with c/o cough, fever, fatigue, and body aches since yesterday. Reports testing positive for COVID on 09/23/22, but fatigue, fever, and body aches had resolved until new onset yesterday.

## 2022-10-05 NOTE — ED Provider Notes (Signed)
[facility]    CSN: [id number] Arrival date & time: 10/05/22  1237      History   Chief Complaint Chief Complaint  Patient presents with   Fever   Generalized Body Aches   Cough    HPI Lisa Bolton is a 36 y.o. female.   Patient presents today with a 48-hour history of worsening URI symptoms.  Reports that on 09/23/2022 she tested positive for COVID.  She did have some improvement of symptoms but they never completely resolved.  Over the past several days she has had significant worsening of symptoms and is now experiencing body ache, fever, congestion, cough, shortness of breath, generalized malaise.  Denies any nausea, vomiting, diarrhea.  She has been taking over-the-counter medication including Tylenol and ibuprofen without improvement of symptoms as well as cetirizine.  She denies history of allergies but does report a remote history of exercise-induced asthma.  She has not required albuterol inhaler anytime recently.  She has taken 2 COVID tests in the past few days that have been negative.  She has had COVID-19 and influenza vaccines.  Denies history of diabetes.  Denies any recent antibiotics or steroids.  She is confident that she is not pregnant.    Past Medical History:  Diagnosis Date   Anemia    Anxiety    Back pain    Chronic vaginitis    Constipation    Family history of anesthesia complication    UNCLE WITH  PSUEDOCHOLINESTEROSE  DEFIENCY   Food allergy    GERD (gastroesophageal reflux disease)    History of thyroid cancer    Malignant neoplasm of thyroid gland ([place]) 10/11/2012   Palpitations    Panic attacks    Recurrent boils, vulva    Vitamin D deficiency     Patient Active Problem List   Diagnosis Date Noted   Abnormal levels of other serum enzymes 03/29/2022   Chronic anal fissure 03/29/2022   Gastroesophageal reflux disease 03/29/2022   Vitamin D insufficiency 02/17/2022   Acquired hypothyroidism 02/17/2022   Visceral fat  02/17/2022   Piriformis syndrome of left side 10/24/2020   Nonallopathic lesion of sacral region 07/23/2020   Nonallopathic lesion of lumbar region 07/23/2020   Nonallopathic lesion of thoracic region 07/23/2020   Hip flexor tightness, right 06/13/2020   Non-seasonal allergic rhinitis 08/20/2018   Papular urticaria 08/20/2018   GAD (generalized anxiety disorder) 06/20/2018   Morbid obesity ([place]) 06/20/2018    Past Surgical History:  Procedure Laterality Date   ANTERIOR CRUCIATE LIGAMENT REPAIR Left 2001 - approximate   LYMPH NODE DISSECTION  10/29/2012   Procedure: LYMPH NODE DISSECTION;  Surgeon: Adin Hector, MD;  Location: [facility];  Service: General;  Laterality: N/A;  central compartment lymph node dissection    THYROID LOBECTOMY  09/21/2012   Procedure: THYROID LOBECTOMY;  Surgeon: Adin Hector, MD;  Location[facility];  Service: General;  Laterality: Right;  Right Thyroid Lobectomy   THYROID LOBECTOMY  10/29/2012   Procedure: THYROID LOBECTOMY;  Surgeon: Adin Hector, MD;  Location: [facility];  Service: General;  Laterality: Left;  left thyroid lobectomy,central compartmental lymph node dissection    OB History     Gravida  0   Para  0   Term  0   Preterm  0   AB  0   Living  0      SAB  0   IAB  0   Ectopic  0   Multiple  0   Live Births  0            Home Medications    Prior to Admission medications   Medication Sig Start Date End Date Taking? Authorizing Provider  amoxicillin-clavulanate (AUGMENTIN) 875-125 MG tablet Take 1 tablet by mouth every 12 (twelve) hours. 10/05/22  Yes Trung Wenzl K, PA-C  predniSONE (DELTASONE) 20 MG tablet Take 2 tablets (40 mg total) by mouth daily for 4 days. 10/05/22 10/09/22 Yes Eneida Evers K, PA-C  clonazePAM (KLONOPIN) 0.5 MG tablet Take 1 tablet (0.5 mg total) by mouth 2 (two) times daily as needed. for anxiety 01/27/22   Inda Coke, PA  EPINEPHrine 0.3 mg/0.3 mL IJ SOAJ injection INJECT AS DIRECTED FOR  SEVERE ALLERGIC REACTION 01/27/22   Inda Coke, PA  ergocalciferol (VITAMIN D2) 1.25 MG (50000 UT) capsule Take 1 capsule by mouth once a week. 06/29/22     escitalopram (LEXAPRO) 10 MG tablet Take 1 tablet (10 mg total) by mouth daily. 08/13/22   Inda Coke, PA  levothyroxine (SYNTHROID) 137 MCG tablet Take 1 tablet (137 mcg total) by mouth daily. Patient not taking: Reported on 06/05/2022 05/16/22     levothyroxine (SYNTHROID) 150 MCG tablet Take 1 tablet (150 mcg total) by mouth every morning on an empty stomach 02/17/22   Everardo Pacific, FNP  levothyroxine (SYNTHROID) 150 MCG tablet Take 1 tablet (150 mcg total) by mouth in the morning on an empty stomach 05/22/22     levothyroxine (SYNTHROID) 150 MCG tablet 1 tablet in the morning on an empty stomach Orally Once a day for 90 days 02/13/22   [provider]  loratadine (CLARITIN) 10 MG tablet Take 10 mg by mouth daily.    [provider]  Multiple Vitamin (MULTI-VITAMIN DAILY PO) Take 1 tablet by mouth daily in the afternoon.    [provider]  Probiotic Product (PROBIOTIC-10) CAPS Take by mouth.    [provider]    Family History Family History  Problem Relation Age of Onset   Squamous cell carcinoma Mother        skin   Hypertension Mother    Heart attack Mother    Multiple sclerosis Mother    Thyroid disease Mother    High Cholesterol Mother    Anxiety disorder Mother    Obesity Mother    Obesity Father    Anxiety disorder Brother    Diabetes type I Brother    Diabetes Mellitus I Brother    Arthritis Maternal Grandmother    Heart disease Paternal Grandmother    Parkinson's disease Paternal Grandmother    Heart attack Paternal Grandfather    Allergic rhinitis Neg Hx    Angioedema Neg Hx    Asthma Neg Hx    Eczema Neg Hx    Colon cancer Neg Hx    Breast cancer Neg Hx     Social History Social History   Tobacco Use   Smoking status: Never   Smokeless tobacco: Never   Vaping Use   Vaping Use: Never used  Substance Use Topics   Alcohol use: Not Currently    Comment: socially   Drug use: No     Allergies   Shellfish allergy, Other, and Pertussis vaccines   Review of Systems Review of Systems  Constitutional:  Positive for activity change, fatigue and fever. Negative for appetite change.  HENT:  Positive for congestion, sinus pressure and sore throat. Negative for sneezing.   Respiratory:  Positive for cough and  shortness of breath.   Cardiovascular:  Negative for chest pain.  Gastrointestinal:  Negative for abdominal pain, diarrhea, nausea and vomiting.  Musculoskeletal:  Positive for arthralgias and myalgias.  Neurological:  Positive for headaches. Negative for dizziness and light-headedness.     Physical Exam Triage Vital Signs ED Triage Vitals  Enc Vitals Group     BP 10/05/22 1323 (!) 125/96     Pulse Rate 10/05/22 1324 96     Resp 10/05/22 1324 20     Temp 10/05/22 1324 99 F (37.2 C)     Temp Source 10/05/22 1324 Oral     SpO2 10/05/22 1324 98 %     Weight --      Height --      Head Circumference --      Peak Flow --      Pain Score 10/05/22 1321 1     Pain Loc --      Pain Edu? --      Excl. in [place]? --    No data found.  Updated Vital Signs BP 129/80 (BP Location: Left Arm)   Pulse 96   Temp 99 F (37.2 C) (Oral)   Resp 20   LMP 09/13/2022   SpO2 98%   Visual Acuity Right Eye Distance:   Left Eye Distance:   Bilateral Distance:    Right Eye Near:   Left Eye Near:    Bilateral Near:     Physical Exam Vitals reviewed.  Constitutional:      General: She is awake. She is not in acute distress.    Appearance: Normal appearance. She is well-developed. She is not ill-appearing.     Comments: Very pleasant female appears stated age in no acute distress sitting comfortably in exam room  HENT:     Head: Normocephalic and atraumatic.     Right Ear: Tympanic membrane, ear canal and external ear normal. Tympanic  membrane is not erythematous or bulging.     Left Ear: Ear canal and external ear normal. A middle ear effusion is present. Tympanic membrane is not erythematous or bulging.     Mouth/Throat:     Pharynx: Uvula midline. Posterior oropharyngeal erythema present. No oropharyngeal exudate.  Cardiovascular:     Rate and Rhythm: Normal rate and regular rhythm.     Heart sounds: Normal heart sounds, S1 normal and S2 normal. No murmur heard. Pulmonary:     Effort: Pulmonary effort is normal.     Breath sounds: Normal breath sounds. No wheezing, rhonchi or rales.     Comments: Clear to auscultation bilaterally Psychiatric:        Behavior: Behavior is cooperative.      UC Treatments / Results  Labs (all labs ordered are listed, but only abnormal results are displayed) Labs Reviewed  POCT INFLUENZA A/B    EKG   Radiology No results found.  Procedures Procedures (including critical care time)  Medications Ordered in UC Medications - No data to display  Initial Impression / Assessment and Plan / UC Course  I have reviewed the triage vital signs and the nursing notes.  Pertinent labs & imaging results that were available during my care of the patient were reviewed by me and considered in my medical decision making (see chart for details).     Patient is well-appearing, afebrile, nontoxic, nontachycardic.  No indication for repeat COVID testing as she has had 2 negative at-home test and has been positive and recovered within the past 90  days.  Flu testing was obtained today and was negative.  Given her symptoms never resolved following COVID and she has had a recent double worsening will cover for secondary bacterial infection.  She was started on Augmentin twice daily for 7 days.  She was started on prednisone burst of 40 mg for 4 days with instruction not to take NSAIDs with this medication and risk of GI bleeding.  Can use Tylenol, Mucinex, Flonase for additional symptom relief.  She  is to rest and drink plenty of fluid.  Discussed that if her symptoms are not improving within 3 to 5 days she should return for reevaluation.  If anything worsens and she has chest pain, shortness of breath, fever, nausea, vomiting, weakness that she needs to be seen immediately.  Strict return precautions given.  Work excuse note provided.  Final Clinical Impressions(s) / UC Diagnoses   Final diagnoses:  Sinobronchitis     Discharge Instructions      Your flu testing was negative.  I am concerned that you might have a sinus infection from your recent COVID illness.  Start Augmentin twice daily.  Take prednisone burst of 40 mg for 4 days.  Do not take NSAIDs with this medication including aspirin, ibuprofen/Advil, naproxen/Aleve.  You can take acetaminophen/Tylenol.  Make sure that you rest and drink plenty of fluid.  Use over-the-counter medications for symptom management.  If your symptoms do not improving within 3 to 5 days or if anything worsens you need to be seen immediately.     ED Prescriptions     Medication Sig Dispense Auth. Provider   amoxicillin-clavulanate (AUGMENTIN) 875-125 MG tablet Take 1 tablet by mouth every 12 (twelve) hours. 14 tablet Rochanda Harpham K, PA-C   predniSONE (DELTASONE) 20 MG tablet Take 2 tablets (40 mg total) by mouth daily for 4 days. 8 tablet Rebel Willcutt, Derry Skill, PA-C      PDMP not reviewed this encounter.   Terrilee Croak, PA-C 10/05/22 1404

## 2022-10-05 NOTE — Telephone Encounter (Signed)
Patient called requesting a different antibiotic as she has had problems with Augmentin in the past though this is not listed on her allergy list.  Doxycycline was sent to pharmacy.

## 2022-10-05 NOTE — Discharge Instructions (Signed)
Your flu testing was negative.  I am concerned that you might have a sinus infection from your recent COVID illness.  Start Augmentin twice daily.  Take prednisone burst of 40 mg for 4 days.  Do not take NSAIDs with this medication including aspirin, ibuprofen/Advil, naproxen/Aleve.  You can take acetaminophen/Tylenol.  Make sure that you rest and drink plenty of fluid.  Use over-the-counter medications for symptom management.  If your symptoms do not improving within 3 to 5 days or if anything worsens you need to be seen immediately.

## 2022-10-07 ENCOUNTER — Other Ambulatory Visit: Payer: Self-pay | Admitting: Physician Assistant

## 2022-10-10 ENCOUNTER — Encounter: Payer: Self-pay | Admitting: Physician Assistant

## 2022-10-10 ENCOUNTER — Ambulatory Visit (INDEPENDENT_AMBULATORY_CARE_PROVIDER_SITE_OTHER): Payer: 59 | Admitting: Physician Assistant

## 2022-10-10 ENCOUNTER — Other Ambulatory Visit (HOSPITAL_BASED_OUTPATIENT_CLINIC_OR_DEPARTMENT_OTHER): Payer: Self-pay

## 2022-10-10 VITALS — BP 122/80 | HR 86 | Temp 97.1°F | Ht 64.0 in | Wt 289.4 lb

## 2022-10-10 DIAGNOSIS — R052 Subacute cough: Secondary | ICD-10-CM | POA: Diagnosis not present

## 2022-10-10 MED ORDER — MONTELUKAST SODIUM 10 MG PO TABS
10.0000 mg | ORAL_TABLET | Freq: Every day | ORAL | 1 refills | Status: DC
  Filled 2022-10-10: qty 30, 30d supply, fill #0

## 2022-10-10 MED ORDER — PREDNISONE 20 MG PO TABS
20.0000 mg | ORAL_TABLET | Freq: Every day | ORAL | 0 refills | Status: DC
  Filled 2022-10-10: qty 5, 5d supply, fill #0

## 2022-10-10 MED ORDER — PREDNISONE 20 MG PO TABS
40.0000 mg | ORAL_TABLET | Freq: Every day | ORAL | 0 refills | Status: DC
  Filled 2022-10-10: qty 5, 2d supply, fill #0

## 2022-10-10 MED ORDER — ALBUTEROL SULFATE HFA 108 (90 BASE) MCG/ACT IN AERS
2.0000 | INHALATION_SPRAY | Freq: Four times a day (QID) | RESPIRATORY_TRACT | 0 refills | Status: DC | PRN
  Filled 2022-10-10: qty 6.7, 25d supply, fill #0

## 2022-10-10 NOTE — Patient Instructions (Addendum)
It was great to see you!  I have sent in 3 things for you to the pharmacy --> prednisone, albuterol inhaler and singulair  Continue regular use of over the counter antihistamines such as Zyrtec (cetirizine), Claritin (loratadine), Allegra (fexofenadine), or Xyzal (levocetirizine) daily as needed. May take twice a day if needed as long as it does not cause drowsiness. Add Singulair '10mg'$  daily at night.  Start Nasal saline spray (i.e., Simply Saline) or nasal saline lavage (i.e., NeilMed) is recommended as needed and prior to medicated nasal sprays. Start Flonase 1 spray twice a day. Also trial 12-hour delsym over the counter cough syrup (generic is fine!)  Use as needed albuterol inhaler for chest tightness or severe coughing fit If all else fails, trial oral prednisone 20 mg daily  Call/message if concerns  Take care,  Inda Coke PA-C

## 2022-10-10 NOTE — Progress Notes (Signed)
Lisa Bolton is a 37 y.o. female here for a new problem.  History of Present Illness:   Chief Complaint  Patient presents with   Follow-up    Pt is here for f/u from ED visit on 10/05/2022, was given antibiotic and has now developed a dry hacking cough.    HPI  Cough Symptoms started 09/20/22. She did e-visit on 12/19 for viral URI and was given tessalon perles. She tested positive for COVID on 09/23/22 and had some improvement of sx. She ended up going to urgent care on 10/05/22 for worsening sx and was given oral doxycycline. She has taken doxycycline as prescribed. Has two more days of doxycycline to complete. She did not start prednisone due to concerns for worsening anxiety.  Does have hx of exercise-induced asthma but has not required inhaler use anytime recently.  Has barking, dry cough. Chest feels tight.   OTC medications: Afrin x 3 days, claritin daily, cough drops  Past Medical History:  Diagnosis Date   Anemia    Anxiety    Back pain    Chronic vaginitis    Constipation    Family history of anesthesia complication    UNCLE WITH  PSUEDOCHOLINESTEROSE  DEFIENCY   Food allergy    GERD (gastroesophageal reflux disease)    History of thyroid cancer    Malignant neoplasm of thyroid gland ([place]) 10/11/2012   Palpitations    Panic attacks    Recurrent boils, vulva    Vitamin D deficiency      Social History   Tobacco Use   Smoking status: Never   Smokeless tobacco: Never  Vaping Use   Vaping Use: Never used  Substance Use Topics   Alcohol use: Not Currently    Comment: socially   Drug use: No    Past Surgical History:  Procedure Laterality Date   ANTERIOR CRUCIATE LIGAMENT REPAIR Left 2001 - approximate   LYMPH NODE DISSECTION  10/29/2012   Procedure: LYMPH NODE DISSECTION;  Surgeon: Adin Hector, MD;  Location: [facility];  Service: General;  Laterality: N/A;  central compartment lymph node dissection    THYROID LOBECTOMY  09/21/2012   Procedure:  THYROID LOBECTOMY;  Surgeon: Adin Hector, MD;  Location: [facility];  Service: General;  Laterality: Right;  Right Thyroid Lobectomy   THYROID LOBECTOMY  10/29/2012   Procedure: THYROID LOBECTOMY;  Surgeon: Adin Hector, MD;  Location: [facility];  Service: General;  Laterality: Left;  left thyroid lobectomy,central compartmental lymph node dissection    Family History  Problem Relation Age of Onset   Squamous cell carcinoma Mother        skin   Hypertension Mother    Heart attack Mother    Multiple sclerosis Mother    Thyroid disease Mother    High Cholesterol Mother    Anxiety disorder Mother    Obesity Mother    Obesity Father    Anxiety disorder Brother    Diabetes type I Brother    Diabetes Mellitus I Brother    Arthritis Maternal Grandmother    Heart disease Paternal Grandmother    Parkinson's disease Paternal Grandmother    Heart attack Paternal Grandfather    Allergic rhinitis Neg Hx    Angioedema Neg Hx    Asthma Neg Hx    Eczema Neg Hx    Colon cancer Neg Hx    Breast cancer Neg Hx     Allergies  Allergen Reactions   Shellfish Allergy  Other Other (See Comments)    Anethesia   Pertussis Vaccines     Current Medications:   Current Outpatient Medications:    albuterol (VENTOLIN HFA) 108 (90 Base) MCG/ACT inhaler, Inhale 2 puffs into the lungs every 6 (six) hours as needed for wheezing or shortness of breath., Disp: 8.5 g, Rfl: 0   clonazePAM (KLONOPIN) 0.5 MG tablet, Take 1 tablet (0.5 mg total) by mouth 2 (two) times daily as needed. for anxiety, Disp: 30 tablet, Rfl: 1   doxycycline (VIBRAMYCIN) 100 MG capsule, Take 1 capsule (100 mg total) by mouth 2 (two) times daily for 14 doses., Disp: 14 capsule, Rfl: 0   EPINEPHrine 0.3 mg/0.3 mL IJ SOAJ injection, INJECT AS DIRECTED FOR SEVERE ALLERGIC REACTION, Disp: 2 each, Rfl: 1   ergocalciferol (VITAMIN D2) 1.25 MG (50000 UT) capsule, Take 1 capsule by mouth once a week., Disp: 12 capsule, Rfl: 0   escitalopram  (LEXAPRO) 10 MG tablet, Take 1 tablet (10 mg total) by mouth daily., Disp: 90 tablet, Rfl: 1   levothyroxine (SYNTHROID) 150 MCG tablet, Take 1 tablet (150 mcg total) by mouth every morning on an empty stomach, Disp: 90 tablet, Rfl: 3   levothyroxine (SYNTHROID) 150 MCG tablet, Take 1 tablet (150 mcg total) by mouth in the morning on an empty stomach, Disp: 90 tablet, Rfl: 3   loratadine (CLARITIN) 10 MG tablet, Take 10 mg by mouth daily., Disp: , Rfl:    montelukast (SINGULAIR) 10 MG tablet, Take 1 tablet (10 mg total) by mouth at bedtime., Disp: 30 tablet, Rfl: 1   Multiple Vitamin (MULTI-VITAMIN DAILY PO), Take 1 tablet by mouth daily in the afternoon., Disp: , Rfl:    Probiotic Product (PROBIOTIC-10) CAPS, Take by mouth., Disp: , Rfl:    predniSONE (DELTASONE) 20 MG tablet, Take 1 tablet (20 mg total) by mouth daily., Disp: 5 tablet, Rfl: 0   Review of Systems:   ROS Negative unless otherwise specified per HPI.  Vitals:   Vitals:   10/10/22 1550  BP: 122/80  Pulse: 86  Temp: (!) 97.1 F (36.2 C)  TempSrc: Temporal  SpO2: 97%  Weight: 289 lb 6.1 oz (131.3 kg)  Height: '5\' 4"'$  (1.626 m)     Body mass index is 49.67 kg/m.  Physical Exam:   Physical Exam Vitals and nursing note reviewed.  Constitutional:      General: She is not in acute distress.    Appearance: She is well-developed. She is not ill-appearing or toxic-appearing.  HENT:     Head: Normocephalic and atraumatic.     Right Ear: Tympanic membrane, ear canal and external ear normal. Tympanic membrane is not erythematous, retracted or bulging.     Left Ear: Tympanic membrane, ear canal and external ear normal. Tympanic membrane is not erythematous, retracted or bulging.     Nose: Nose normal.     Right Sinus: No maxillary sinus tenderness or frontal sinus tenderness.     Left Sinus: No maxillary sinus tenderness or frontal sinus tenderness.     Mouth/Throat:     Pharynx: Uvula midline. No posterior oropharyngeal  erythema.  Eyes:     General: Lids are normal.     Conjunctiva/sclera: Conjunctivae normal.  Neck:     Trachea: Trachea normal.  Cardiovascular:     Rate and Rhythm: Normal rate and regular rhythm.     Heart sounds: Normal heart sounds, S1 normal and S2 normal.  Pulmonary:     Effort: Pulmonary effort is normal.  Breath sounds: Normal breath sounds. No decreased breath sounds, wheezing, rhonchi or rales.  Lymphadenopathy:     Cervical: No cervical adenopathy.  Skin:    General: Skin is warm and dry.  Neurological:     Mental Status: She is alert.  Psychiatric:        Speech: Speech normal.        Behavior: Behavior normal. Behavior is cooperative.     Assessment and Plan:   Subacute cough No red flags Post-viral cough Advised as follows: Continue regular use of over the counter antihistamines such as Zyrtec (cetirizine), Claritin (loratadine), Allegra (fexofenadine), or Xyzal (levocetirizine) daily as needed. May take twice a day if needed as long as it does not cause drowsiness. Add Singulair '10mg'$  daily at night.  Start Nasal saline spray (i.e., Simply Saline) or nasal saline lavage (i.e., NeilMed) is recommended as needed and prior to medicated nasal sprays. Start Flonase 1 spray twice a day. Also trial 12-hour delsym over the counter cough syrup (generic is fine!)  Use as needed albuterol inhaler for chest tightness or severe coughing fit If all else fails, trial oral prednisone 20 mg daily  Follow-up if new/worsening  Inda Coke, PA-C

## 2022-11-12 DIAGNOSIS — F411 Generalized anxiety disorder: Secondary | ICD-10-CM | POA: Diagnosis not present

## 2023-01-22 ENCOUNTER — Encounter: Payer: Self-pay | Admitting: Physician Assistant

## 2023-01-23 ENCOUNTER — Other Ambulatory Visit: Payer: Self-pay | Admitting: Physician Assistant

## 2023-01-23 ENCOUNTER — Other Ambulatory Visit (HOSPITAL_BASED_OUTPATIENT_CLINIC_OR_DEPARTMENT_OTHER): Payer: Self-pay

## 2023-01-23 MED ORDER — PREDNISONE 20 MG PO TABS
20.0000 mg | ORAL_TABLET | Freq: Every day | ORAL | 0 refills | Status: DC
  Filled 2023-01-23: qty 5, 5d supply, fill #0

## 2023-01-23 MED ORDER — METHOCARBAMOL 500 MG PO TABS
500.0000 mg | ORAL_TABLET | Freq: Three times a day (TID) | ORAL | 0 refills | Status: AC | PRN
  Filled 2023-01-23: qty 30, 10d supply, fill #0

## 2023-01-30 ENCOUNTER — Encounter: Payer: No Typology Code available for payment source | Admitting: Physician Assistant

## 2023-02-06 ENCOUNTER — Other Ambulatory Visit (HOSPITAL_BASED_OUTPATIENT_CLINIC_OR_DEPARTMENT_OTHER): Payer: Self-pay

## 2023-02-06 ENCOUNTER — Other Ambulatory Visit: Payer: Self-pay

## 2023-02-06 ENCOUNTER — Other Ambulatory Visit: Payer: Self-pay | Admitting: Physician Assistant

## 2023-02-06 DIAGNOSIS — F411 Generalized anxiety disorder: Secondary | ICD-10-CM

## 2023-02-06 MED ORDER — ESCITALOPRAM OXALATE 10 MG PO TABS
10.0000 mg | ORAL_TABLET | Freq: Every day | ORAL | 1 refills | Status: DC
  Filled 2023-02-06: qty 90, 90d supply, fill #0

## 2023-02-11 DIAGNOSIS — D509 Iron deficiency anemia, unspecified: Secondary | ICD-10-CM | POA: Diagnosis not present

## 2023-02-11 DIAGNOSIS — E890 Postprocedural hypothyroidism: Secondary | ICD-10-CM | POA: Diagnosis not present

## 2023-02-11 DIAGNOSIS — C73 Malignant neoplasm of thyroid gland: Secondary | ICD-10-CM | POA: Diagnosis not present

## 2023-02-11 DIAGNOSIS — E559 Vitamin D deficiency, unspecified: Secondary | ICD-10-CM | POA: Diagnosis not present

## 2023-02-11 NOTE — Progress Notes (Signed)
Subjective:    Lisa Bolton is a 37 y.o. female and is here for a comprehensive physical exam.  HPI  There are no preventive care reminders to display for this patient.  Acute Concerns: None.   Chronic Issues: Anxiety: Her mood is overall stable.  She has been compliant with Lexapro 10 mg daily She has not been needing to regularly take Klonopin.   Weight management: Previously took [place] which did not help much, though she notes at the time she was not exercising and maintaining a healthy diet.  Has done Weight Watchers in the past. She is currently trying to work on better diet and exercise.  She believes group classes will benefit her.  She previously enjoyed water exercises and running, which she is unable to do due to joint pain.  Health Maintenance: Immunizations -- UTD Colonoscopy -- N/a Mammogram -- N/a PAP -- Last 01/22/2021. Results were normal.  Bone Density -- N/a Diet -- Trying to eat healthier.  Exercise -- not regularly exercising. Previously enjoyed water exercising.   Sleep habits -- No new concerns.  Mood -- Stable - see HPI  UTD with dentist? - UTD UTD with eye doctor? - No. No new changes. Plans to find eye doctor soon.   Weight history: Wt Readings from Last 10 Encounters:  02/12/23 293 lb (132.9 kg)  10/10/22 289 lb 6.1 oz (131.3 kg)  06/05/22 292 lb (132.5 kg)  03/25/22 287 lb (130.2 kg)  03/04/22 288 lb (130.6 kg)  02/13/22 285 lb (129.3 kg)  01/27/22 288 lb 4 oz (130.7 kg)  01/16/22 286 lb (129.7 kg)  01/02/22 294 lb (133.4 kg)  01/22/21 282 lb 6.1 oz (128.1 kg)   Body mass index is 50.29 kg/m. No LMP recorded.  Alcohol use:  reports that she does not currently use alcohol.  Tobacco use:  Tobacco Use: Low Risk  (02/12/2023)   Patient History    Smoking Tobacco Use: Never    Smokeless Tobacco Use: Never    Passive Exposure: Not on file   Eligible for lung cancer screening? No     02/12/2023    9:31 AM   Depression screen PHQ 2/9  Decreased Interest 0  Down, Depressed, Hopeless 0  PHQ - 2 Score 0  Altered sleeping 0  Tired, decreased energy 0  Change in appetite 0  Feeling bad or failure about yourself  0  Trouble concentrating 0  Moving slowly or fidgety/restless 0  Suicidal thoughts 0  PHQ-9 Score 0  Difficult doing work/chores Not difficult at all     Other providers/specialists: Patient Care Team: Jarold Motto, [place] as PCP - General (Physician Assistant) Adrian Prince, MD as Consulting Physician (Endocrinology)    PMHx, SurgHx, SocialHx, Medications, and Allergies were reviewed in the Visit Navigator and updated as appropriate.   Past Medical History:  Diagnosis Date   Allergy    Anemia    Anxiety    Back pain    Chronic vaginitis    Constipation    Family history of anesthesia complication    UNCLE WITH  PSUEDOCHOLINESTEROSE  DEFIENCY   Food allergy    GERD (gastroesophageal reflux disease)    History of thyroid cancer    Malignant neoplasm of thyroid gland ([facility]) 10/11/2012   Palpitations    Panic attacks    Recurrent boils, vulva    Vitamin D deficiency      Past Surgical History:  Procedure Laterality Date   ANTERIOR CRUCIATE LIGAMENT  REPAIR Left 2001 - approximate   LYMPH NODE DISSECTION  10/29/2012   Procedure: LYMPH NODE DISSECTION;  Surgeon: [name], MD;  Location: [facility];  Service: General;  Laterality: N/A;  central compartment lymph node dissection    THYROID LOBECTOMY  09/21/2012   Procedure: THYROID LOBECTOMY;  Surgeon: [name], MD;  Location: [facility];  Service: General;  Laterality: Right;  Right Thyroid Lobectomy   THYROID LOBECTOMY  10/29/2012   Procedure: THYROID LOBECTOMY;  Surgeon: [name], MD;  Location: [facility];  Service: General;  Laterality: Left;  left thyroid lobectomy,central compartmental lymph node dissection     Family History  Problem Relation Age of Onset   Squamous cell carcinoma Mother         skin   Hypertension Mother    Heart attack Mother    Multiple sclerosis Mother    Thyroid disease Mother    High Cholesterol Mother    Anxiety disorder Mother    Obesity Mother    Cancer Mother    Heart disease Mother    Obesity Father    Anxiety disorder Brother    Diabetes type I Brother        Juvenile   Diabetes Brother    Hypertension Brother    Diabetes Brother    Obesity Brother    Arthritis Maternal Grandmother    Heart disease Paternal Grandmother    Parkinson's disease Paternal Grandmother    Heart attack Paternal Grandfather    Allergic rhinitis Neg Hx    Angioedema Neg Hx    Asthma Neg Hx    Eczema Neg Hx    Colon cancer Neg Hx    Breast cancer Neg Hx     Social History   Tobacco Use   Smoking status: Never   Smokeless tobacco: Never  Vaping Use   Vaping Use: Never used  Substance Use Topics   Alcohol use: Not Currently    Comment: socially   Drug use: No    Review of Systems:   Review of Systems  Constitutional:  Negative for chills, fever, malaise/fatigue and weight loss.  HENT:  Negative for hearing loss, sinus pain and sore throat.   Respiratory:  Negative for cough and hemoptysis.   Cardiovascular:  Negative for chest pain, palpitations, leg swelling and PND.  Gastrointestinal:  Negative for abdominal pain, constipation, diarrhea, heartburn, nausea and vomiting.  Genitourinary:  Negative for dysuria, frequency and urgency.  Musculoskeletal:  Negative for back pain, myalgias and neck pain.  Skin:  Negative for itching and rash.  Neurological:  Negative for dizziness, tingling, seizures and headaches.  Endo/Heme/Allergies:  Negative for polydipsia.  Psychiatric/Behavioral:  Negative for depression. The patient is not nervous/anxious.     Objective:   BP 130/78   Pulse 97   Temp 98.6 F (37 C)   Ht 5\' 4"  (1.626 m)   Wt 293 lb (132.9 kg)   SpO2 97%   BMI 50.29 kg/m  Body mass index is 50.29 kg/m.   General Appearance:    Alert,  cooperative, no distress, appears stated age  Head:    Normocephalic, without obvious abnormality, atraumatic  Eyes:    PERRL, conjunctiva/corneas clear, EOM's intact, fundi    benign, both eyes  Ears:    Normal TM's and external ear canals, both ears  Nose:   Nares normal, septum midline, mucosa normal, no drainage    or sinus tenderness  Throat:   Lips, mucosa, and tongue normal;  teeth and gums normal  Neck:   Supple, symmetrical, trachea midline, no adenopathy;    thyroid:  no enlargement/tenderness/nodules; no carotid   bruit or JVD  Back:     Symmetric, no curvature, ROM normal, no CVA tenderness  Lungs:     Clear to auscultation bilaterally, respirations unlabored  Chest Wall:    No tenderness or deformity   Heart:    Regular rate and rhythm, S1 and S2 normal, no murmur, rub or gallop  Breast Exam:    Deferred   Abdomen:     Soft, non-tender, bowel sounds active all four quadrants,    no masses, no organomegaly  Genitalia:    Deferred   Extremities:   Extremities normal, atraumatic, no cyanosis or edema  Pulses:   2+ and symmetric all extremities  Skin:   Skin color, texture, turgor normal, no rashes or lesions  Lymph nodes:   Cervical, supraclavicular, and axillary nodes normal  Neurologic:   CNII-XII intact, normal strength, sensation and reflexes    throughout    Assessment/Plan:   Routine physical examination Today patient counseled on age appropriate routine health concerns for screening and prevention, each reviewed and up to date or declined. Immunizations reviewed and up to date or declined. Labs ordered and reviewed. Risk factors for depression reviewed and negative. Hearing function and visual acuity are intact. ADLs screened and addressed as needed. Functional ability and level of safety reviewed and appropriate. Education, counseling and referrals performed based on assessed risks today. Patient provided with a copy of personalized plan for preventive  services.  Vitamin D insufficiency Update Vitamin D and provide recommendations accordingly  Obesity, unspecified classification, unspecified obesity type, unspecified whether serious comorbidity present Continue healthy lifestyle efforts  GAD (generalized anxiety disorder) Well controlled Continue Lexapro 10 mg daily Denies SI/HI Refill klonopin Follow-up in 1 year, sooner if concerns    I,Rachel Rivera,acting as a scribe for [employer], PA.,have documented all relevant documentation on the behalf of Jarold Motto, PA,as directed by  Jarold Motto, PA while in the presence of Jarold Motto, [place].  I, Jarold Motto, [place], have reviewed all documentation for this visit. The documentation on 02/12/23 for the exam, diagnosis, procedures, and orders are all accurate and complete.   Jarold Motto, PA-C [facility]

## 2023-02-12 ENCOUNTER — Ambulatory Visit (INDEPENDENT_AMBULATORY_CARE_PROVIDER_SITE_OTHER): Payer: 59 | Admitting: Physician Assistant

## 2023-02-12 ENCOUNTER — Other Ambulatory Visit (HOSPITAL_BASED_OUTPATIENT_CLINIC_OR_DEPARTMENT_OTHER): Payer: Self-pay

## 2023-02-12 ENCOUNTER — Encounter: Payer: Self-pay | Admitting: Physician Assistant

## 2023-02-12 VITALS — BP 130/78 | HR 97 | Temp 98.6°F | Ht 64.0 in | Wt 293.0 lb

## 2023-02-12 DIAGNOSIS — F411 Generalized anxiety disorder: Secondary | ICD-10-CM | POA: Diagnosis not present

## 2023-02-12 DIAGNOSIS — E669 Obesity, unspecified: Secondary | ICD-10-CM

## 2023-02-12 DIAGNOSIS — E559 Vitamin D deficiency, unspecified: Secondary | ICD-10-CM

## 2023-02-12 DIAGNOSIS — Z0000 Encounter for general adult medical examination without abnormal findings: Secondary | ICD-10-CM | POA: Diagnosis not present

## 2023-02-12 LAB — CBC WITH DIFFERENTIAL/PLATELET
Basophils Absolute: 0.1 10*3/uL (ref 0.0–0.1)
Basophils Relative: 1.2 % (ref 0.0–3.0)
Eosinophils Absolute: 0.1 10*3/uL (ref 0.0–0.7)
Eosinophils Relative: 2.4 % (ref 0.0–5.0)
HCT: 34.9 % — ABNORMAL LOW (ref 36.0–46.0)
Hemoglobin: 11.4 g/dL — ABNORMAL LOW (ref 12.0–15.0)
Lymphocytes Relative: 27.2 % (ref 12.0–46.0)
Lymphs Abs: 1.6 10*3/uL (ref 0.7–4.0)
MCHC: 32.8 g/dL (ref 30.0–36.0)
MCV: 79.7 fl (ref 78.0–100.0)
Monocytes Absolute: 0.4 10*3/uL (ref 0.1–1.0)
Monocytes Relative: 7.3 % (ref 3.0–12.0)
Neutro Abs: 3.6 10*3/uL (ref 1.4–7.7)
Neutrophils Relative %: 61.9 % (ref 43.0–77.0)
Platelets: 340 10*3/uL (ref 150.0–400.0)
RBC: 4.38 Mil/uL (ref 3.87–5.11)
RDW: 16.4 % — ABNORMAL HIGH (ref 11.5–15.5)
WBC: 5.8 10*3/uL (ref 4.0–10.5)

## 2023-02-12 LAB — COMPREHENSIVE METABOLIC PANEL
ALT: 27 U/L (ref 0–35)
AST: 18 U/L (ref 0–37)
Albumin: 3.8 g/dL (ref 3.5–5.2)
Alkaline Phosphatase: 110 U/L (ref 39–117)
BUN: 12 mg/dL (ref 6–23)
CO2: 24 mEq/L (ref 19–32)
Calcium: 8.6 mg/dL (ref 8.4–10.5)
Chloride: 107 mEq/L (ref 96–112)
Creatinine, Ser: 0.61 mg/dL (ref 0.40–1.20)
GFR: 113.63 mL/min (ref >60.00)
Glucose, Bld: 107 mg/dL — ABNORMAL HIGH (ref 70–99)
Potassium: 4.2 mEq/L (ref 3.5–5.1)
Sodium: 138 mEq/L (ref 135–145)
Total Bilirubin: 0.3 mg/dL (ref 0.2–1.2)
Total Protein: 6.6 g/dL (ref 6.0–8.3)

## 2023-02-12 LAB — LIPID PANEL
Cholesterol: 186 mg/dL (ref 0–200)
HDL: 46.9 mg/dL (ref >39.00)
LDL Cholesterol: 119 mg/dL — ABNORMAL HIGH (ref 0–99)
NonHDL: 138.92
Total CHOL/HDL Ratio: 4
Triglycerides: 98 mg/dL (ref 0.0–149.0)
VLDL: 19.6 mg/dL (ref 0.0–40.0)

## 2023-02-12 LAB — VITAMIN D 25 HYDROXY (VIT D DEFICIENCY, FRACTURES): VITD: 25.88 ng/mL — ABNORMAL LOW (ref 30.00–100.00)

## 2023-02-12 LAB — HEMOGLOBIN A1C: Hgb A1c MFr Bld: 6 % (ref 4.6–6.5)

## 2023-02-12 MED ORDER — CLONAZEPAM 0.5 MG PO TABS
0.5000 mg | ORAL_TABLET | Freq: Two times a day (BID) | ORAL | 1 refills | Status: DC | PRN
  Filled 2023-02-12 – 2023-05-14 (×3): qty 30, 15d supply, fill #0

## 2023-02-12 MED ORDER — ESCITALOPRAM OXALATE 10 MG PO TABS
10.0000 mg | ORAL_TABLET | Freq: Every day | ORAL | 3 refills | Status: DC
Start: 2023-02-12 — End: 2024-02-16
  Filled 2023-02-12: qty 90, 90d supply, fill #0
  Filled 2023-05-14: qty 30, 30d supply, fill #0
  Filled 2023-06-08: qty 30, 30d supply, fill #1
  Filled 2023-07-08: qty 30, 30d supply, fill #2
  Filled 2023-08-14: qty 30, 30d supply, fill #3
  Filled 2023-09-08: qty 30, 30d supply, fill #4
  Filled 2023-10-14: qty 30, 30d supply, fill #5
  Filled 2023-11-09: qty 30, 30d supply, fill #6
  Filled 2023-12-09: qty 30, 30d supply, fill #7
  Filled 2024-01-08: qty 30, 30d supply, fill #8

## 2023-02-12 NOTE — Patient Instructions (Signed)
It was great to see you! ? ?Please go to the lab for blood work.  ? ?Our office will call you with your results unless you have chosen to receive results via MyChart. ? ?If your blood work is normal we will follow-up each year for physicals and as scheduled for chronic medical problems. ? ?If anything is abnormal we will treat accordingly and get you in for a follow-up. ? ?Take care, ? ?Lisa Bolton ?  ? ? ?

## 2023-02-21 ENCOUNTER — Other Ambulatory Visit (HOSPITAL_BASED_OUTPATIENT_CLINIC_OR_DEPARTMENT_OTHER): Payer: Self-pay

## 2023-05-14 ENCOUNTER — Other Ambulatory Visit (HOSPITAL_BASED_OUTPATIENT_CLINIC_OR_DEPARTMENT_OTHER): Payer: Self-pay

## 2023-05-14 MED ORDER — LEVOTHYROXINE SODIUM 150 MCG PO TABS
150.0000 ug | ORAL_TABLET | Freq: Every morning | ORAL | 3 refills | Status: AC
  Filled 2023-05-14: qty 30, 30d supply, fill #0
  Filled 2023-06-08: qty 30, 30d supply, fill #1
  Filled 2023-07-08: qty 30, 30d supply, fill #2
  Filled 2023-08-14: qty 30, 30d supply, fill #3
  Filled 2023-09-08: qty 30, 30d supply, fill #4
  Filled 2023-10-14: qty 30, 30d supply, fill #5
  Filled 2023-11-09: qty 30, 30d supply, fill #6
  Filled 2023-12-09: qty 30, 30d supply, fill #7
  Filled 2024-01-08: qty 30, 30d supply, fill #8

## 2023-06-08 ENCOUNTER — Other Ambulatory Visit (HOSPITAL_BASED_OUTPATIENT_CLINIC_OR_DEPARTMENT_OTHER): Payer: Self-pay

## 2023-06-12 ENCOUNTER — Other Ambulatory Visit (HOSPITAL_BASED_OUTPATIENT_CLINIC_OR_DEPARTMENT_OTHER): Payer: Self-pay

## 2023-07-08 ENCOUNTER — Other Ambulatory Visit (HOSPITAL_BASED_OUTPATIENT_CLINIC_OR_DEPARTMENT_OTHER): Payer: Self-pay

## 2023-07-08 ENCOUNTER — Other Ambulatory Visit: Payer: Self-pay

## 2023-07-08 ENCOUNTER — Other Ambulatory Visit: Payer: Self-pay | Admitting: Physician Assistant

## 2023-07-08 DIAGNOSIS — L282 Other prurigo: Secondary | ICD-10-CM

## 2023-07-08 MED ORDER — EPINEPHRINE 0.3 MG/0.3ML IJ SOAJ
INTRAMUSCULAR | 1 refills | Status: AC
Start: 2023-07-08 — End: ?
  Filled 2023-07-08: qty 2, 30d supply, fill #0

## 2023-11-25 ENCOUNTER — Telehealth: Payer: Managed Care, Other (non HMO) | Admitting: Physician Assistant

## 2023-11-25 ENCOUNTER — Encounter: Payer: Self-pay | Admitting: Physician Assistant

## 2023-11-25 ENCOUNTER — Other Ambulatory Visit (HOSPITAL_BASED_OUTPATIENT_CLINIC_OR_DEPARTMENT_OTHER): Payer: Self-pay

## 2023-11-25 ENCOUNTER — Ambulatory Visit: Payer: Self-pay | Admitting: Physician Assistant

## 2023-11-25 DIAGNOSIS — U071 COVID-19: Secondary | ICD-10-CM | POA: Diagnosis not present

## 2023-11-25 MED ORDER — NIRMATRELVIR/RITONAVIR (PAXLOVID)TABLET
3.0000 | ORAL_TABLET | Freq: Two times a day (BID) | ORAL | 0 refills | Status: AC
Start: 2023-11-25 — End: 2023-11-30
  Filled 2023-11-25: qty 30, 5d supply, fill #0

## 2023-11-25 NOTE — Telephone Encounter (Signed)
Chief Complaint: COVID positive Symptoms: cough, sore throat, headache, nausea, muscle pain/weakness Frequency: x 2 days Pertinent Negatives: Patient denies loss of taste or smell, fever, difficulty breathing, chest pain Disposition: [] ED /[] Urgent Care (no appt availability in office) / [x] Appointment(In office/virtual)/ []  [facility]/ [] Home Care/ [] Refused Recommended Disposition /[] [facility]/ []  Follow-up with PCP Additional Notes: Patient's original incoming call states she is requesting Paxlovid, patient states she has a work meeting next week and would like to decrease the length of her symptoms. Patient scheduled for acute virtual visit today with provider and given COVID home care advice.   Copied from CRM [id number]. Topic: Clinical - Medical Advice >> Nov 25, 2023  1:28 PM Desma Mcgregor wrote: Reason for CRM: Patient has been tested positive today for covid and asking if the med PAXLOVID can be prescribed and sent to local pharmacy. Also asking what's the best OTC med for a sore throat. Please follow up Reason for Disposition  [1] COVID-19 diagnosed by positive lab test (e.g., PCR, rapid self-test kit) AND [2] mild symptoms (e.g., cough, fever, others) AND [3] no complications or SOB  Answer Assessment - Initial Assessment Questions 1. COVID-19 DIAGNOSIS: "How do you know that you have COVID?" (e.g., positive lab test or self-test, diagnosed by doctor or NP/PA, symptoms after exposure).     Positive COVID home test.  2. COVID-19 EXPOSURE: "Was there any known exposure to COVID before the symptoms began?" CDC Definition of close contact: within 6 feet (2 meters) for a total of 15 minutes or more over a 24-hour period.      She states she was at a party on Saturday and states multiple people now have symptoms.  3. ONSET: "When did the COVID-19 symptoms start?"      Monday afternoon, started with nausea and vomiting. Cough started yesterday.  4. WORST SYMPTOM:  "What is your worst symptom?" (e.g., cough, fever, shortness of breath, muscle aches)     Cough and sore throat.  5. COUGH: "Do you have a cough?" If Yes, ask: "How bad is the cough?"       Yes, dry, chesty whooping cough. 5/10.  6. FEVER: "Do you have a fever?" If Yes, ask: "What is your temperature, how was it measured, and when did it start?"     Denies.  7. RESPIRATORY STATUS: "Describe your breathing?" (e.g., normal; shortness of breath, wheezing, unable to speak)      She states she can hear congestion in her chest this morning "almost like a rattle or wheeze" and improved after coughing.  8. BETTER-SAME-WORSE: "Are you getting better, staying the same or getting worse compared to yesterday?"  If getting worse, ask, "In what way?"     Staying the same; felt worse this morning but has improved throughout the day.  9. OTHER SYMPTOMS: "Do you have any other symptoms?"  (e.g., chills, fatigue, headache, loss of smell or taste, muscle pain, sore throat)     Headache, nausea, muscle pain/weakness.  10. HIGH RISK DISEASE: "Do you have any chronic medical problems?" (e.g., asthma, heart or lung disease, weak immune system, obesity, etc.)       Thyroid removed and is on medication.  11. VACCINE: "Have you had the COVID-19 vaccine?" If Yes, ask: "Which one, how many shots, when did you get it?"       Yes, 4 doses and last booster was 2022 or 2023.  12. PREGNANCY: "Is there any chance you are pregnant?" "When was  your last menstrual period?"       Denies, LMP: last week.  13. O2 SATURATION MONITOR:  "Do you use an oxygen saturation monitor (pulse oximeter) at home?" If Yes, ask "What is your reading (oxygen level) today?" "What is your usual oxygen saturation reading?" (e.g., 95%)       Denies having one at home.  Protocols used: Coronavirus (COVID-19) Diagnosed or Suspected-A-AH

## 2023-11-25 NOTE — Telephone Encounter (Signed)
 Noted

## 2023-11-25 NOTE — Progress Notes (Signed)
Virtual Visit via Video Note  I connected with Lisa Bolton on 11/25/23 at  2:00 PM EST by a video enabled telemedicine application and verified that I am speaking with the correct person using two identifiers.  Patient Location: Home Provider Location: Home Office  I discussed the limitations, risks, security, and privacy concerns of performing an evaluation and management service by video and the availability of in person appointments. I also discussed with the patient that there may be a patient responsible charge related to this service. The patient expressed understanding and agreed to proceed.  Subjective: PCP: Jarold Motto, PA  No chief complaint on file.  Patient presents today for positive home covid test. She states symptoms started Monday with nausea and progressed to cough and headache yesterday. She reports family gathering this weekend with multiple other family members now sick. She denies fevers, shortness of breath, or chest pain. She has been taking tylenol, ibuprofen, and Delsum cough at home. She reports eating and drinking as usual.     ROS: Per HPI  Current Outpatient Medications:    nirmatrelvir/ritonavir (PAXLOVID) 20 x 150 MG & 10 x 100MG  TABS, Take 3 tablets by mouth 2 (two) times daily for 5 days. (Take nirmatrelvir 150 mg two tablets twice daily for 5 days and ritonavir 100 mg one tablet twice daily for 5 days) Patient GFR is 113, Disp: 30 tablet, Rfl: 0   albuterol (VENTOLIN HFA) 108 (90 Base) MCG/ACT inhaler, Inhale 2 puffs into the lungs every 6 (six) hours as needed for wheezing or shortness of breath. (Patient not taking: Reported on 11/25/2023), Disp: 6.7 g, Rfl: 0   clonazePAM (KLONOPIN) 0.5 MG tablet, Take 1 tablet (0.5 mg total) by mouth 2 (two) times daily as needed. for anxiety, Disp: 30 tablet, Rfl: 1   EPINEPHrine 0.3 mg/0.3 mL IJ SOAJ injection, INJECT AS DIRECTED FOR SEVERE ALLERGIC REACTION, Disp: 2 each, Rfl: 1   ergocalciferol  (VITAMIN D2) 1.25 MG (50000 UT) capsule, Take 1 capsule by mouth once a week., Disp: 12 capsule, Rfl: 0   escitalopram (LEXAPRO) 10 MG tablet, Take 1 tablet (10 mg total) by mouth daily., Disp: 90 tablet, Rfl: 3   levothyroxine (SYNTHROID) 150 MCG tablet, Take 1 tablet (150 mcg total) by mouth in the morning on an empty stomach, Disp: 90 tablet, Rfl: 3   loratadine (CLARITIN) 10 MG tablet, Take 10 mg by mouth daily., Disp: , Rfl:    methocarbamol (ROBAXIN) 500 MG tablet, Take 1 tablet (500 mg total) by mouth every 8 (eight) hours as needed for muscle spasms., Disp: 30 tablet, Rfl: 0   montelukast (SINGULAIR) 10 MG tablet, Take 1 tablet (10 mg total) by mouth at bedtime. (Patient not taking: Reported on 11/25/2023), Disp: 30 tablet, Rfl: 1   Multiple Vitamin (MULTI-VITAMIN DAILY PO), Take 1 tablet by mouth daily in the afternoon., Disp: , Rfl:    Probiotic Product (PROBIOTIC-10) CAPS, Take by mouth., Disp: , Rfl:   Observations/Objective: There were no vitals filed for this visit. Physical Exam Constitutional:      Appearance: Normal appearance.  HENT:     Head: Normocephalic and atraumatic.  Pulmonary:     Effort: Pulmonary effort is normal.  Musculoskeletal:     Cervical back: Normal range of motion.  Neurological:     Mental Status: She is alert and oriented to person, place, and time.  Psychiatric:        Mood and Affect: Mood normal.     Assessment  and Plan: COVID-19 virus infection -     nirmatrelvir/ritonavir; Take 3 tablets by mouth 2 (two) times daily for 5 days. (Take nirmatrelvir 150 mg two tablets twice daily for 5 days and ritonavir 100 mg one tablet twice daily for 5 days) Patient GFR is 113  Dispense: 30 tablet; Refill: 0  Patient appears stable today. Normal work of breathing. Supportive care reviewed with patient. Paxlovid ordered today.Tylenol or ibuprofen for pain or fever as needed. May continue with OTC cold medications. She was advised isolation precautions x 5  days, and can return to work if fever free for 24 hours without fever reducing medications. Patient instructed to return to clinic if worsening shortness of breath, chest pain, hypoxia, or other concerns. Patient agreeable to plan.    Follow Up Instructions: Return if symptoms worsen or fail to improve.   I discussed the assessment and treatment plan with the patient. The patient was provided an opportunity to ask questions, and all were answered. The patient agreed with the plan and demonstrated an understanding of the instructions.   The patient was advised to call back or seek an in-person evaluation if the symptoms worsen or if the condition fails to improve as anticipated.  The above assessment and management plan was discussed with the patient. The patient verbalized understanding of and has agreed to the management plan.   Toni Amend Broden Holt, PA-C

## 2023-12-10 ENCOUNTER — Other Ambulatory Visit (HOSPITAL_BASED_OUTPATIENT_CLINIC_OR_DEPARTMENT_OTHER): Payer: Self-pay

## 2023-12-10 ENCOUNTER — Other Ambulatory Visit: Payer: Self-pay

## 2023-12-10 MED ORDER — METRONIDAZOLE 1 % EX GEL
1.0000 | Freq: Every day | CUTANEOUS | 3 refills | Status: AC
  Filled 2023-12-10: qty 60, 30d supply, fill #0

## 2023-12-10 MED ORDER — IVERMECTIN 1 % EX CREA
1.0000 | TOPICAL_CREAM | Freq: Every day | CUTANEOUS | 0 refills | Status: AC
  Filled 2023-12-10: qty 45, 30d supply, fill #0

## 2023-12-11 ENCOUNTER — Other Ambulatory Visit (HOSPITAL_BASED_OUTPATIENT_CLINIC_OR_DEPARTMENT_OTHER): Payer: Self-pay

## 2023-12-11 ENCOUNTER — Other Ambulatory Visit: Payer: Self-pay

## 2023-12-14 ENCOUNTER — Other Ambulatory Visit: Payer: Self-pay

## 2024-01-01 ENCOUNTER — Telehealth: Payer: Self-pay | Admitting: *Deleted

## 2024-01-01 NOTE — Telephone Encounter (Signed)
 Copied from CRM [id number]. Topic: Appointments - Appointment Cancel/Reschedule >> Jan 01, 2024  9:04 AM Truddie Crumble wrote: Patient/patient representative is calling to cancel or reschedule an appointment. Refer to attachments for appointment information.   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 Patient would like the letter sent to [facility]

## 2024-01-04 ENCOUNTER — Encounter: Payer: Self-pay | Admitting: Physician Assistant

## 2024-01-04 NOTE — Telephone Encounter (Signed)
 Pt notified letter completed and is in her My Chart. Pt verbalized understanding.

## 2024-02-08 ENCOUNTER — Other Ambulatory Visit: Payer: Self-pay | Admitting: Physician Assistant

## 2024-02-08 DIAGNOSIS — F411 Generalized anxiety disorder: Secondary | ICD-10-CM

## 2024-02-09 ENCOUNTER — Other Ambulatory Visit (HOSPITAL_BASED_OUTPATIENT_CLINIC_OR_DEPARTMENT_OTHER): Payer: Self-pay

## 2024-02-09 MED ORDER — CLONAZEPAM 0.5 MG PO TABS
0.5000 mg | ORAL_TABLET | Freq: Two times a day (BID) | ORAL | 1 refills | Status: AC | PRN
Start: 2024-02-09 — End: ?
  Filled 2024-02-09: qty 30, 15d supply, fill #0
  Filled 2024-05-31: qty 30, 15d supply, fill #1

## 2024-02-09 NOTE — Telephone Encounter (Signed)
 Pt requesting refill for Clonazepam  0.5 mg tablet. Last OV 02/12/2023, appt scheduled for 5/19.

## 2024-02-11 ENCOUNTER — Other Ambulatory Visit (HOSPITAL_BASED_OUTPATIENT_CLINIC_OR_DEPARTMENT_OTHER): Payer: Self-pay

## 2024-02-15 ENCOUNTER — Encounter: Payer: 59 | Admitting: Physician Assistant

## 2024-02-16 ENCOUNTER — Other Ambulatory Visit: Payer: Self-pay | Admitting: Physician Assistant

## 2024-02-16 DIAGNOSIS — F411 Generalized anxiety disorder: Secondary | ICD-10-CM

## 2024-02-17 ENCOUNTER — Other Ambulatory Visit (HOSPITAL_BASED_OUTPATIENT_CLINIC_OR_DEPARTMENT_OTHER): Payer: Self-pay

## 2024-02-22 ENCOUNTER — Ambulatory Visit (INDEPENDENT_AMBULATORY_CARE_PROVIDER_SITE_OTHER): Admitting: Physician Assistant

## 2024-02-22 ENCOUNTER — Encounter: Payer: Self-pay | Admitting: Physician Assistant

## 2024-02-22 VITALS — BP 130/80 | HR 73 | Temp 97.4°F | Ht 64.0 in | Wt 303.2 lb

## 2024-02-22 DIAGNOSIS — E669 Obesity, unspecified: Secondary | ICD-10-CM | POA: Diagnosis not present

## 2024-02-22 DIAGNOSIS — E039 Hypothyroidism, unspecified: Secondary | ICD-10-CM | POA: Diagnosis not present

## 2024-02-22 DIAGNOSIS — R7303 Prediabetes: Secondary | ICD-10-CM | POA: Insufficient documentation

## 2024-02-22 DIAGNOSIS — Z0000 Encounter for general adult medical examination without abnormal findings: Secondary | ICD-10-CM

## 2024-02-22 DIAGNOSIS — E559 Vitamin D deficiency, unspecified: Secondary | ICD-10-CM

## 2024-02-22 LAB — COMPREHENSIVE METABOLIC PANEL WITH GFR
ALT: 20 U/L (ref 0–35)
AST: 17 U/L (ref 0–37)
Albumin: 4.1 g/dL (ref 3.5–5.2)
Alkaline Phosphatase: 95 U/L (ref 39–117)
BUN: 13 mg/dL (ref 6–23)
CO2: 28 meq/L (ref 19–32)
Calcium: 9 mg/dL (ref 8.4–10.5)
Chloride: 104 meq/L (ref 96–112)
Creatinine, Ser: 0.68 mg/dL (ref 0.40–1.20)
GFR: 109.9 mL/min (ref >60.00)
Glucose, Bld: 95 mg/dL (ref 70–99)
Potassium: 4.4 meq/L (ref 3.5–5.1)
Sodium: 139 meq/L (ref 135–145)
Total Bilirubin: 0.3 mg/dL (ref 0.2–1.2)
Total Protein: 7.3 g/dL (ref 6.0–8.3)

## 2024-02-22 LAB — CBC WITH DIFFERENTIAL/PLATELET
Basophils Absolute: 0.1 10*3/uL (ref 0.0–0.1)
Basophils Relative: 0.9 % (ref 0.0–3.0)
Eosinophils Absolute: 0.1 10*3/uL (ref 0.0–0.7)
Eosinophils Relative: 2.5 % (ref 0.0–5.0)
HCT: 33.7 % — ABNORMAL LOW (ref 36.0–46.0)
Hemoglobin: 11.1 g/dL — ABNORMAL LOW (ref 12.0–15.0)
Lymphocytes Relative: 31.1 % (ref 12.0–46.0)
Lymphs Abs: 1.8 10*3/uL (ref 0.7–4.0)
MCHC: 32.8 g/dL (ref 30.0–36.0)
MCV: 76.7 fl — ABNORMAL LOW (ref 78.0–100.0)
Monocytes Absolute: 0.4 10*3/uL (ref 0.1–1.0)
Monocytes Relative: 7.2 % (ref 3.0–12.0)
Neutro Abs: 3.4 10*3/uL (ref 1.4–7.7)
Neutrophils Relative %: 58.3 % (ref 43.0–77.0)
Platelets: 318 10*3/uL (ref 150.0–400.0)
RBC: 4.39 Mil/uL (ref 3.87–5.11)
RDW: 16.9 % — ABNORMAL HIGH (ref 11.5–15.5)
WBC: 5.8 10*3/uL (ref 4.0–10.5)

## 2024-02-22 LAB — LIPID PANEL
Cholesterol: 180 mg/dL (ref 0–200)
HDL: 42.5 mg/dL (ref >39.00)
LDL Cholesterol: 112 mg/dL — ABNORMAL HIGH (ref 0–99)
NonHDL: 137
Total CHOL/HDL Ratio: 4
Triglycerides: 126 mg/dL (ref 0.0–149.0)
VLDL: 25.2 mg/dL (ref 0.0–40.0)

## 2024-02-22 MED ORDER — ZEPBOUND 2.5 MG/0.5ML ~~LOC~~ SOAJ: 2.5000 mg | SUBCUTANEOUS | 0 refills | Status: DC

## 2024-02-22 NOTE — Progress Notes (Signed)
 Subjective:    Lisa Bolton is a 38 y.o. female and is here for a comprehensive physical exam.  HPI  There are no preventive care reminders to display for this patient.   Acute Concerns: None.  Chronic Issues: Morbid obesity Pt states she would like to go back on injectables. She states she would like to try Zepbound . Pt notes she has a new partner, beginning 7 months ago. She reports uncertainty about their future together and feels like their relationship has influenced her weight. She is agreeable to seeing Dr. Lajuana Pilar again at [facility]. Endorses walking 30 mins daily.  Muscle spasms Pt is on Robaxin  500 mg every 8 hours as needed. She reports she has not used it recently but may need it due to resumed exercise. Pt would like a refill.  GAD Pt is on Lexapro  10 mg once daily. Good compliance and tolerance. No acute concerns reported today.  Health Maintenance: Immunizations -- None. Colonoscopy -- N/a Mammogram -- Last done 03/19/2017, results showed no mammographic or sonographic evidence of malignancy in either breast. Recommended to consult dermatology and repeat screening at age 88. PAP -- Last done 01/22/2021, results were NILM. Bone Density -- N/a Diet -- Overall healthy diet. Exercise -- Exercising daily.  Sleep habits -- No acute concerns reported today. Mood -- Stable.  UTD with dentist? - UTD UTD with eye doctor? - UTD UTD with dermatology? - Not yet.  Weight history: Wt Readings from Last 10 Encounters:  02/22/24 (!) 303 lb 4 oz (137.6 kg)  02/12/23 293 lb (132.9 kg)  10/10/22 289 lb 6.1 oz (131.3 kg)  06/05/22 292 lb (132.5 kg)  03/25/22 287 lb (130.2 kg)  03/04/22 288 lb (130.6 kg)  02/13/22 285 lb (129.3 kg)  01/27/22 288 lb 4 oz (130.7 kg)  01/16/22 286 lb (129.7 kg)  01/02/22 294 lb (133.4 kg)   Body mass index is 52.05 kg/m. Patient's last menstrual period was 02/08/2024 (approximate).  Alcohol use:  reports current  alcohol use of about 2.0 standard drinks of alcohol per week.  Tobacco use:  Tobacco Use: Low Risk  (02/22/2024)   Patient History    Smoking Tobacco Use: Never    Smokeless Tobacco Use: Never    Passive Exposure: Not on file   Eligible for lung cancer screening? no     02/22/2024   10:50 AM  Depression screen PHQ 2/9  Decreased Interest 0  Down, Depressed, Hopeless 0  PHQ - 2 Score 0     Other providers/specialists: Patient Care Team: Alexander Iba, [place] as PCP - General (Physician Assistant) Rosslyn Coons, MD as Consulting Physician (Endocrinology)    PMHx, SurgHx, SocialHx, Medications, and Allergies were reviewed in the Visit Navigator and updated as appropriate.   Past Medical History:  Diagnosis Date   Allergy     Anemia    Anxiety    Back pain    Chronic vaginitis    Constipation    Family history of anesthesia complication    UNCLE WITH  PSUEDOCHOLINESTEROSE  DEFIENCY   Food allergy     GERD (gastroesophageal reflux disease)    History of thyroid  cancer    Malignant neoplasm of thyroid  gland (HCC) 10/11/2012   Palpitations    Panic attacks    Recurrent boils, vulva    Vitamin D  deficiency      Past Surgical History:  Procedure Laterality Date   ANTERIOR CRUCIATE LIGAMENT REPAIR Left 2001 - approximate   LYMPH NODE DISSECTION  10/29/2012  Procedure: LYMPH NODE DISSECTION;  Surgeon: Levert Ready, MD;  Location: [facility] OR;  Service: General;  Laterality: N/A;  central compartment lymph node dissection    THYROID  LOBECTOMY  09/21/2012   Procedure: THYROID  LOBECTOMY;  Surgeon: Levert Ready, MD;  Location: [facility] ORS;  Service: General;  Laterality: Right;  Right Thyroid  Lobectomy   THYROID  LOBECTOMY  10/29/2012   Procedure: THYROID  LOBECTOMY;  Surgeon: Levert Ready, MD;  Location: [facility] OR;  Service: General;  Laterality: Left;  left thyroid  lobectomy,central compartmental lymph node dissection     Family History  Problem Relation Age of Onset    Squamous cell carcinoma Mother        skin   Hypertension Mother    Heart attack Mother    Multiple sclerosis Mother    Thyroid  disease Mother    High Cholesterol Mother    Anxiety disorder Mother    Obesity Mother    Cancer Mother    Heart disease Mother    Obesity Father    Anxiety disorder Brother    Diabetes type I Brother        Juvenile   Diabetes Brother    Hypertension Brother    Diabetes Brother    Obesity Brother    Arthritis Maternal Grandmother    Memory loss Maternal Grandfather    Heart disease Paternal Grandmother    Parkinson's disease Paternal Grandmother    Heart attack Paternal Grandfather    Allergic rhinitis Neg Hx    Angioedema Neg Hx    Asthma Neg Hx    Eczema Neg Hx    Colon cancer Neg Hx    Breast cancer Neg Hx     Social History   Tobacco Use   Smoking status: Never   Smokeless tobacco: Never  Vaping Use   Vaping status: Never Used  Substance Use Topics   Alcohol use: Yes    Alcohol/week: 2.0 standard drinks of alcohol    Types: 2 Standard drinks or equivalent per week    Comment: socially   Drug use: No    Review of Systems:   Review of Systems  Constitutional:  Negative for chills, fever, malaise/fatigue and weight loss.  HENT:  Negative for hearing loss, sinus pain and sore throat.   Respiratory:  Negative for cough and hemoptysis.   Cardiovascular:  Negative for chest pain, palpitations, leg swelling and PND.  Gastrointestinal:  Negative for abdominal pain, constipation, diarrhea, heartburn, nausea and vomiting.  Genitourinary:  Negative for dysuria, frequency and urgency.  Musculoskeletal:  Negative for back pain, myalgias and neck pain.  Skin:  Negative for itching and rash.  Neurological:  Negative for dizziness, tingling, seizures and headaches.  Endo/Heme/Allergies:  Negative for polydipsia.  Psychiatric/Behavioral:  Negative for depression. The patient is not nervous/anxious.      Objective:   BP 130/80 (BP Location:  Left Arm, Patient Position: Sitting, Cuff Size: Large)   Pulse 73   Temp (!) 97.4 F (36.3 C) (Temporal)   Ht 5\' 4"  (1.626 m)   Wt (!) 303 lb 4 oz (137.6 kg)   LMP 02/08/2024 (Approximate)   SpO2 97%   BMI 52.05 kg/m  Body mass index is 52.05 kg/m.   General Appearance:    Alert, cooperative, no distress, appears stated age  Head:    Normocephalic, without obvious abnormality, atraumatic  Eyes:    PERRL, conjunctiva/corneas clear, EOM's intact, fundi    benign, both eyes  Ears:    Normal TM's  and external ear canals, both ears  Nose:   Nares normal, septum midline, mucosa normal, no drainage    or sinus tenderness  Throat:   Lips, mucosa, and tongue normal; teeth and gums normal  Neck:   Supple, symmetrical, trachea midline, no adenopathy;    thyroid :  no enlargement/tenderness/nodules; no carotid   bruit or JVD  Back:     Symmetric, no curvature, ROM normal, no CVA tenderness  Lungs:     Clear to auscultation bilaterally, respirations unlabored  Chest Wall:    No tenderness or deformity   Heart:    Regular rate and rhythm, S1 and S2 normal, no murmur, rub or gallop  Breast Exam:    Deferred  Abdomen:     Soft, non-tender, bowel sounds active all four quadrants,    no masses, no organomegaly  Genitalia:    Deferred  Extremities:   Extremities normal, atraumatic, no cyanosis or edema  Pulses:   2+ and symmetric all extremities  Skin:   Skin color, texture, turgor normal, no rashes or lesions  Lymph nodes:   Cervical, supraclavicular, and axillary nodes normal  Neurologic:   CNII-XII intact, normal strength, sensation and reflexes    throughout    Assessment/Plan:   Routine physical examination Today patient counseled on age appropriate routine health concerns for screening and prevention, each reviewed and up to date or declined. Immunizations reviewed and up to date or declined. Labs ordered and reviewed. Risk factors for depression reviewed and negative. Hearing function  and visual acuity are intact. ADLs screened and addressed as needed. Functional ability and level of safety reviewed and appropriate. Education, counseling and referrals performed based on assessed risks today. Patient provided with a copy of personalized plan for preventive services.  Obesity, unspecified class, unspecified obesity type, unspecified whether serious comorbidity present Continue efforts at healthy lifestyle Will send in Zepbound  0.25 mg weekly for her to start Follow up in 3 month(s), after starting  Vitamin D  insufficiency Update vitamin D    Acquired hypothyroidism Continue close follow up with Dr Jesse Moritz Compliant with care   I, Timoteo Force, acting as a Neurosurgeon for [employer], [place]., have documented all relevant documentation on the behalf of Alexander Iba, [place], as directed by  Alexander Iba, PA while in the presence of Alexander Iba, [place].  I, Alexander Iba, [place], have reviewed all documentation for this visit. The documentation on 02/22/24 for the exam, diagnosis, procedures, and orders are all accurate and complete.  Alexander Iba, PA-C [facility]

## 2024-02-22 NOTE — Patient Instructions (Addendum)
 It was great to see you!  Zepbound  flyer Look into [facility]  Please go to the lab for blood work.   Our office will call you with your results unless you have chosen to receive results via MyChart.  If your blood work is normal we will follow-up each year for physicals and as scheduled for chronic medical problems.  If anything is abnormal we will treat accordingly and get you in for a follow-up.  Take care,  Jarvis Sawa

## 2024-02-23 LAB — VITAMIN D 25 HYDROXY (VIT D DEFICIENCY, FRACTURES): VITD: 18.02 ng/mL — ABNORMAL LOW (ref 30.00–100.00)

## 2024-02-24 ENCOUNTER — Other Ambulatory Visit (HOSPITAL_BASED_OUTPATIENT_CLINIC_OR_DEPARTMENT_OTHER): Payer: Self-pay

## 2024-02-24 ENCOUNTER — Ambulatory Visit: Payer: Self-pay | Admitting: Physician Assistant

## 2024-02-24 MED ORDER — VITAMIN D (ERGOCALCIFEROL) 1.25 MG (50000 UNIT) PO CAPS
50000.0000 [IU] | ORAL_CAPSULE | ORAL | 0 refills | Status: DC
  Filled 2024-02-24: qty 12, 84d supply, fill #0

## 2024-02-29 ENCOUNTER — Telehealth: Admitting: Physician Assistant

## 2024-02-29 DIAGNOSIS — J329 Chronic sinusitis, unspecified: Secondary | ICD-10-CM

## 2024-02-29 DIAGNOSIS — J40 Bronchitis, not specified as acute or chronic: Secondary | ICD-10-CM | POA: Diagnosis not present

## 2024-02-29 MED ORDER — DOXYCYCLINE HYCLATE 100 MG PO TABS: 100.0000 mg | ORAL_TABLET | Freq: Two times a day (BID) | ORAL | 0 refills | Status: DC

## 2024-02-29 MED ORDER — PREDNISONE 20 MG PO TABS: 40.0000 mg | ORAL_TABLET | Freq: Every day | ORAL | 0 refills | Status: DC

## 2024-02-29 NOTE — Patient Instructions (Signed)
 [name], thank you for joining Angelia Kelp, PA-C for today's virtual visit.  While this provider is not your primary care provider (PCP), if your PCP is located in our provider database this encounter information will be shared with them immediately following your visit.   A [facility] MyChart account gives you access to today's visit and all your visits, tests, and labs performed at [facility] " click here if you don't have a [facility] MyChart account or go to [email or web address]  Consent: (Patient) [name] provided verbal consent for this virtual visit at the beginning of the encounter.  Current Medications:  Current Outpatient Medications:    doxycycline  (VIBRA -TABS) 100 MG tablet, Take 1 tablet (100 mg total) by mouth 2 (two) times daily., Disp: 20 tablet, Rfl: 0   predniSONE  (DELTASONE ) 20 MG tablet, Take 2 tablets (40 mg total) by mouth daily with breakfast., Disp: 14 tablet, Rfl: 0   clonazePAM  (KLONOPIN ) 0.5 MG tablet, Take 1 tablet (0.5 mg total) by mouth 2 (two) times daily as needed. for anxiety, Disp: 30 tablet, Rfl: 1   EPINEPHrine  0.3 mg/0.3 mL IJ SOAJ injection, INJECT AS DIRECTED FOR SEVERE ALLERGIC REACTION, Disp: 2 each, Rfl: 1   escitalopram  (LEXAPRO ) 10 MG tablet, Take 1 tablet (10 mg total) by mouth daily., Disp: 90 tablet, Rfl: 3   Ivermectin  1 % CREA, Apply 1 Application topically to affected areas on face daily., Disp: 45 g, Rfl: 0   levothyroxine  (SYNTHROID ) 150 MCG tablet, Take 1 tablet (150 mcg total) by mouth in the morning on an empty stomach, Disp: 90 tablet, Rfl: 3   loratadine (CLARITIN) 10 MG tablet, Take 10 mg by mouth daily., Disp: , Rfl:    methocarbamol  (ROBAXIN ) 500 MG tablet, Take 1 tablet (500 mg total) by mouth every 8 (eight) hours as needed for muscle spasms., Disp: 30 tablet, Rfl: 0   metroNIDAZOLE  (METROGEL ) 1 % gel, Apply 1 Application topically to face daily., Disp: 60 g, Rfl: 3   Multiple Vitamin  (MULTI-VITAMIN DAILY PO), Take 1 tablet by mouth daily in the afternoon., Disp: , Rfl:    tirzepatide  (ZEPBOUND ) 2.5 MG/0.5ML Pen, Inject 2.5 mg into the skin once a week., Disp: 2 mL, Rfl: 0   Vitamin D , Ergocalciferol , (DRISDOL ) 1.25 MG (50000 UNIT) CAPS capsule, Take 1 capsule (50,000 Units total) by mouth every 7 (seven) days., Disp: 12 capsule, Rfl: 0   Medications ordered in this encounter:  Meds ordered this encounter  Medications   doxycycline  (VIBRA -TABS) 100 MG tablet    Sig: Take 1 tablet (100 mg total) by mouth 2 (two) times daily.    Dispense:  20 tablet    Refill:  0    Supervising Provider:   LAMPTEY, PHILIP O [[id number]]   predniSONE  (DELTASONE ) 20 MG tablet    Sig: Take 2 tablets (40 mg total) by mouth daily with breakfast.    Dispense:  14 tablet    Refill:  0    Supervising Provider:   Corine Dice [phone]     *If you need refills on other medications prior to your next appointment, please contact your pharmacy*  Follow-Up: Call back or seek an in-person evaluation if the symptoms worsen or if the condition fails to improve as anticipated.  [facility] [phone]  Other Instructions  Acute Bronchitis, Adult  Acute bronchitis is sudden inflammation of the main airways (bronchi) that come off the windpipe (trachea) in the lungs. The swelling  causes the airways to get smaller and make more mucus than normal. This can make it hard to breathe and can cause coughing or noisy breathing (wheezing). Acute bronchitis may last several weeks. The cough may last longer. Allergies, asthma, and exposure to smoke may make the condition worse. What are the causes? This condition can be caused by germs and by substances that irritate the lungs, including: Cold and flu viruses. The most common cause of this condition is the virus that causes the common cold. Bacteria. This is less common. Breathing in substances that irritate the lungs, including: Smoke from  cigarettes and other forms of tobacco. Dust and pollen. Fumes from household cleaning products, gases, or burned fuel. Indoor or outdoor air pollution. What increases the risk? The following factors may make you more likely to develop this condition: A weak body's defense system, also called the immune system. A condition that affects your lungs and breathing, such as asthma. What are the signs or symptoms? Common symptoms of this condition include: Coughing. This may bring up clear, yellow, or green mucus from your lungs (sputum). Wheezing. Runny or stuffy nose. Having too much mucus in your lungs (chest congestion). Shortness of breath. Aches and pains, including sore throat or chest. How is this diagnosed? This condition is usually diagnosed based on: Your symptoms and medical history. A physical exam. You may also have other tests, including tests to rule out other conditions, such as pneumonia. These tests include: A test of lung function. Test of a mucus sample to look for the presence of bacteria. Tests to check the oxygen level in your blood. Blood tests. Chest X-ray. How is this treated? Most cases of acute bronchitis clear up over time without treatment. Your health care provider may recommend: Drinking more fluids to help thin your mucus so it is easier to cough up. Taking inhaled medicine (inhaler) to improve air flow in and out of your lungs. Using a vaporizer or a humidifier. These are machines that add water to the air to help you breathe better. Taking a medicine that thins mucus and clears congestion (expectorant). Taking a medicine that prevents or stops coughing (cough suppressant). It is not common to take an antibiotic medicine for this condition. Follow these instructions at home:  Take over-the-counter and prescription medicines only as told by your health care provider. Use an inhaler, vaporizer, or humidifier as told by your health care provider. Take  two teaspoons (10 mL) of honey at bedtime to lessen coughing at night. Drink enough fluid to keep your urine pale yellow. Do not use any products that contain nicotine or tobacco. These products include cigarettes, chewing tobacco, and vaping devices, such as e-cigarettes. If you need help quitting, ask your health care provider. Get plenty of rest. Return to your normal activities as told by your health care provider. Ask your health care provider what activities are safe for you. Keep all follow-up visits. This is important. How is this prevented? To lower your risk of getting this condition again: Wash your hands often with soap and water for at least 20 seconds. If soap and water are not available, use hand sanitizer. Avoid contact with people who have cold symptoms. Try not to touch your mouth, nose, or eyes with your hands. Avoid breathing in smoke or chemical fumes. Breathing smoke or chemical fumes will make your condition worse. Get the flu shot every year. Contact a health care provider if: Your symptoms do not improve after 2 weeks. You  have trouble coughing up the mucus. Your cough keeps you awake at night. You have a fever. Get help right away if you: Cough up blood. Feel pain in your chest. Have severe shortness of breath. Faint or keep feeling like you are going to faint. Have a severe headache. Have a fever or chills that get worse. These symptoms may represent a serious problem that is an emergency. Do not wait to see if the symptoms will go away. Get medical help right away. Call your local emergency services (911 in the [place] Do not drive yourself to the hospital. Summary Acute bronchitis is inflammation of the main airways (bronchi) that come off the windpipe (trachea) in the lungs. The swelling causes the airways to get smaller and make more mucus than normal. Drinking more fluids can help thin your mucus so it is easier to cough up. Take over-the-counter and  prescription medicines only as told by your health care provider. Do not use any products that contain nicotine or tobacco. These products include cigarettes, chewing tobacco, and vaping devices, such as e-cigarettes. If you need help quitting, ask your health care provider. Contact a health care provider if your symptoms do not improve after 2 weeks. This information is not intended to replace advice given to you by your health care provider. Make sure you discuss any questions you have with your health care provider. Document Revised: 01/02/2022 Document Reviewed: 01/23/2021 Elsevier Patient Education  2024 Elsevier Inc.   Sinus Infection, Adult A sinus infection, also called sinusitis, is inflammation of your sinuses. Sinuses are hollow spaces in the bones around your face. Your sinuses are located: Around your eyes. In the middle of your forehead. Behind your nose. In your cheekbones. Mucus normally drains out of your sinuses. When your nasal tissues become inflamed or swollen, mucus can become trapped or blocked. This allows bacteria, viruses, and fungi to grow, which leads to infection. Most infections of the sinuses are caused by a virus. A sinus infection can develop quickly. It can last for up to 4 weeks (acute) or for more than 12 weeks (chronic). A sinus infection often develops after a cold. What are the causes? This condition is caused by anything that creates swelling in the sinuses or stops mucus from draining. This includes: Allergies. Asthma. Infection from bacteria or viruses. Deformities or blockages in your nose or sinuses. Abnormal growths in the nose (nasal polyps). Pollutants, such as chemicals or irritants in the air. Infection from fungi. This is rare. What increases the risk? You are more likely to develop this condition if you: Have a weak body defense system (immune system). Do a lot of swimming or diving. Overuse nasal sprays. Smoke. What are the signs or  symptoms? The main symptoms of this condition are pain and a feeling of pressure around the affected sinuses. Other symptoms include: Stuffy nose or congestion that makes it difficult to breathe through your nose. Thick yellow or greenish drainage from your nose. Tenderness, swelling, and warmth over the affected sinuses. A cough that may get worse at night. Decreased sense of smell and taste. Extra mucus that collects in the throat or the back of the nose (postnasal drip) causing a sore throat or bad breath. Tiredness (fatigue). Fever. How is this diagnosed? This condition is diagnosed based on: Your symptoms. Your medical history. A physical exam. Tests to find out if your condition is acute or chronic. This may include: Checking your nose for nasal polyps. Viewing your sinuses using a  device that has a light (endoscope). Testing for allergies or bacteria. Imaging tests, such as an MRI or CT scan. In rare cases, a bone biopsy may be done to rule out more serious types of fungal sinus disease. How is this treated? Treatment for a sinus infection depends on the cause and whether your condition is chronic or acute. If caused by a virus, your symptoms should go away on their own within 10 days. You may be given medicines to relieve symptoms. They include: Medicines that shrink swollen nasal passages (decongestants). A spray that eases inflammation of the nostrils (topical intranasal corticosteroids). Rinses that help get rid of thick mucus in your nose (nasal saline washes). Medicines that treat allergies (antihistamines). Over-the-counter pain relievers. If caused by bacteria, your health care provider may recommend waiting to see if your symptoms improve. Most bacterial infections will get better without antibiotic medicine. You may be given antibiotics if you have: A severe infection. A weak immune system. If caused by narrow nasal passages or nasal polyps, surgery may be  needed. Follow these instructions at home: Medicines Take, use, or apply over-the-counter and prescription medicines only as told by your health care provider. These may include nasal sprays. If you were prescribed an antibiotic medicine, take it as told by your health care provider. Do not stop taking the antibiotic even if you start to feel better. Hydrate and humidify  Drink enough fluid to keep your urine pale yellow. Staying hydrated will help to thin your mucus. Use a cool mist humidifier to keep the humidity level in your home above 50%. Inhale steam for 10-15 minutes, 3-4 times a day, or as told by your health care provider. You can do this in the bathroom while a hot shower is running. Limit your exposure to cool or dry air. Rest Rest as much as possible. Sleep with your head raised (elevated). Make sure you get enough sleep each night. General instructions  Apply a warm, moist washcloth to your face 3-4 times a day or as told by your health care provider. This will help with discomfort. Use nasal saline washes as often as told by your health care provider. Wash your hands often with soap and water to reduce your exposure to germs. If soap and water are not available, use hand sanitizer. Do not smoke. Avoid being around people who are smoking (secondhand smoke). Keep all follow-up visits. This is important. Contact a health care provider if: You have a fever. Your symptoms get worse. Your symptoms do not improve within 10 days. Get help right away if: You have a severe headache. You have persistent vomiting. You have severe pain or swelling around your face or eyes. You have vision problems. You develop confusion. Your neck is stiff. You have trouble breathing. These symptoms may be an emergency. Get help right away. Call 911. Do not wait to see if the symptoms will go away. Do not drive yourself to the hospital. Summary A sinus infection is soreness and inflammation of  your sinuses. Sinuses are hollow spaces in the bones around your face. This condition is caused by nasal tissues that become inflamed or swollen. The swelling traps or blocks the flow of mucus. This allows bacteria, viruses, and fungi to grow, which leads to infection. If you were prescribed an antibiotic medicine, take it as told by your health care provider. Do not stop taking the antibiotic even if you start to feel better. Keep all follow-up visits. This is important. This information  is not intended to replace advice given to you by your health care provider. Make sure you discuss any questions you have with your health care provider. Document Revised: 08/27/2021 Document Reviewed: 08/27/2021 Elsevier Patient Education  2024 Elsevier Inc.   If you have been instructed to have an in-person evaluation today at a local Urgent Care facility, please use the link below. It will take you to a list of all of our available [facility], including address, phone number and hours of operation. Please do not delay care.  [facility]  If you or a family member do not have a primary care provider, use the link below to schedule a visit and establish care. When you choose a [facility] primary care physician or advanced practice provider, you gain a long-term partner in health. Find a Primary Care Provider  Learn more about [facility]'s in-office and virtual care options: [facility] - Get Care Now

## 2024-02-29 NOTE — Progress Notes (Signed)
 Virtual Visit Consent   Lisa Bolton, you are scheduled for a virtual visit with a [facility] provider today. Just as with appointments in the office, your consent must be obtained to participate. Your consent will be active for this visit and any virtual visit you may have with one of our providers in the next 365 days. If you have a MyChart account, a copy of this consent can be sent to you electronically.  As this is a virtual visit, video technology does not allow for your provider to perform a traditional examination. This may limit your provider's ability to fully assess your condition. If your provider identifies any concerns that need to be evaluated in person or the need to arrange testing (such as labs, EKG, etc.), we will make arrangements to do so. Although advances in technology are sophisticated, we cannot ensure that it will always work on either your end or our end. If the connection with a video visit is poor, the visit may have to be switched to a telephone visit. With either a video or telephone visit, we are not always able to ensure that we have a secure connection.  By engaging in this virtual visit, you consent to the provision of healthcare and authorize for your insurance to be billed (if applicable) for the services provided during this visit. Depending on your insurance coverage, you may receive a charge related to this service.  I need to obtain your verbal consent now. Are you willing to proceed with your visit today? Lisa Bolton has provided verbal consent on 02/29/2024 for a virtual visit (video or telephone). Lisa Kelp, PA-C  Date: 02/29/2024 11:29 AM   Virtual Visit via Video Note   I, Lisa Bolton, connected with  Lisa Bolton  ([id number], 1986-06-29) on 02/29/24 at 11:15 AM EDT by a video-enabled telemedicine application and verified that I am speaking with the correct person using two identifiers.  Location: Patient: Virtual  Visit Location Patient: Home Provider: Virtual Visit Location Provider: Home Office   I discussed the limitations of evaluation and management by telemedicine and the availability of in person appointments. The patient expressed understanding and agreed to proceed.    History of Present Illness: Lisa Bolton is a 38 y.o. who identifies as a female who was assigned female at birth, and is being seen today for sinus congestion, chest congestion, cough.  HPI: URI  This is a new problem. The current episode started 1 to 4 weeks ago. The problem has been gradually worsening. There has been no fever. Associated symptoms include chest pain (tightness), congestion, coughing (productive), headaches, a plugged ear sensation (last week), rhinorrhea (and post nasal drainage), sinus pain, sneezing and a sore throat (in the morning with post nasal drainage). Pertinent negatives include no diarrhea, ear pain, nausea or vomiting. Associated symptoms comments: Had some chills this morning. She has tried antihistamine (Claritin, advil cold and sinus, flonase , afrin, delsym) for the symptoms. The treatment provided mild relief.  Has taken 3 at home Covid + Flu test were all negative   Problems:  Patient Active Problem List   Diagnosis Date Noted   Prediabetes 02/22/2024   Abnormal levels of other serum enzymes 03/29/2022   Chronic anal fissure 03/29/2022   Gastroesophageal reflux disease 03/29/2022   Vitamin D  insufficiency 02/17/2022   Acquired hypothyroidism 02/17/2022   Visceral fat 02/17/2022   Piriformis syndrome of left side 10/24/2020   Nonallopathic lesion of sacral region 07/23/2020  Nonallopathic lesion of lumbar region 07/23/2020   Nonallopathic lesion of thoracic region 07/23/2020   Hip flexor tightness, right 06/13/2020   Non-seasonal allergic rhinitis 08/20/2018   Papular urticaria 08/20/2018   GAD (generalized anxiety disorder) 06/20/2018   Morbid obesity (HCC) 06/20/2018     Allergies:  Allergies  Allergen Reactions   Shellfish Allergy     Other Other (See Comments)    Anethesia   Pertussis Vaccines    Medications:  Current Outpatient Medications:    doxycycline  (VIBRA -TABS) 100 MG tablet, Take 1 tablet (100 mg total) by mouth 2 (two) times daily., Disp: 20 tablet, Rfl: 0   predniSONE  (DELTASONE ) 20 MG tablet, Take 2 tablets (40 mg total) by mouth daily with breakfast., Disp: 14 tablet, Rfl: 0   clonazePAM  (KLONOPIN ) 0.5 MG tablet, Take 1 tablet (0.5 mg total) by mouth 2 (two) times daily as needed. for anxiety, Disp: 30 tablet, Rfl: 1   EPINEPHrine  0.3 mg/0.3 mL IJ SOAJ injection, INJECT AS DIRECTED FOR SEVERE ALLERGIC REACTION, Disp: 2 each, Rfl: 1   escitalopram  (LEXAPRO ) 10 MG tablet, Take 1 tablet (10 mg total) by mouth daily., Disp: 90 tablet, Rfl: 3   Ivermectin  1 % CREA, Apply 1 Application topically to affected areas on face daily., Disp: 45 g, Rfl: 0   levothyroxine  (SYNTHROID ) 150 MCG tablet, Take 1 tablet (150 mcg total) by mouth in the morning on an empty stomach, Disp: 90 tablet, Rfl: 3   loratadine (CLARITIN) 10 MG tablet, Take 10 mg by mouth daily., Disp: , Rfl:    methocarbamol  (ROBAXIN ) 500 MG tablet, Take 1 tablet (500 mg total) by mouth every 8 (eight) hours as needed for muscle spasms., Disp: 30 tablet, Rfl: 0   metroNIDAZOLE  (METROGEL ) 1 % gel, Apply 1 Application topically to face daily., Disp: 60 g, Rfl: 3   Multiple Vitamin (MULTI-VITAMIN DAILY PO), Take 1 tablet by mouth daily in the afternoon., Disp: , Rfl:    tirzepatide  (ZEPBOUND ) 2.5 MG/0.5ML Pen, Inject 2.5 mg into the skin once a week., Disp: 2 mL, Rfl: 0   Vitamin D , Ergocalciferol , (DRISDOL ) 1.25 MG (50000 UNIT) CAPS capsule, Take 1 capsule (50,000 Units total) by mouth every 7 (seven) days., Disp: 12 capsule, Rfl: 0  Observations/Objective: Patient is well-developed, well-nourished in no acute distress.  Resting comfortably at home.  Head is normocephalic, atraumatic.  No  labored breathing.  Speech is clear and coherent with logical content.  Patient is alert and oriented at baseline.    Assessment and Plan: 1. Sinobronchitis (Primary) - doxycycline  (VIBRA -TABS) 100 MG tablet; Take 1 tablet (100 mg total) by mouth 2 (two) times daily.  Dispense: 20 tablet; Refill: 0 - predniSONE  (DELTASONE ) 20 MG tablet; Take 2 tablets (40 mg total) by mouth daily with breakfast.  Dispense: 14 tablet; Refill: 0  - Worsening over a week despite OTC medications - Will treat with Doxycycline  and Prednisone  - Can continue OTC medications of choice for symptom management - Push fluids.  - Rest.  - Steam and humidifier can help - Seek in person evaluation if worsening or symptoms fail to improve    Follow Up Instructions: I discussed the assessment and treatment plan with the patient. The patient was provided an opportunity to ask questions and all were answered. The patient agreed with the plan and demonstrated an understanding of the instructions.  A copy of instructions were sent to the patient via MyChart unless otherwise noted below.    The patient was advised to call back  or seek an in-person evaluation if the symptoms worsen or if the condition fails to improve as anticipated.    Lisa Kelp, PA-C

## 2024-03-01 ENCOUNTER — Other Ambulatory Visit (HOSPITAL_COMMUNITY): Payer: Self-pay

## 2024-03-01 ENCOUNTER — Telehealth: Payer: Self-pay | Admitting: Physician Assistant

## 2024-03-01 NOTE — Telephone Encounter (Unsigned)
 Copied from CRM [id number]. Topic: Medical Record Request - Provider/Facility Request >> Mar 01, 2024 11:19 AM Magdalene [employer] wrote: Reason for CRM: Dani calling from [facility] requesting chart notes and relevant lab results related to the diagnosis and prescription of tirzepatide  (Zepbound ) 2.5 mg/0.5 mL pen for the patient.  Please provide the necessary documentation to support the prescription and ensure timely processing.  Fax: [phone] Call back number: [phone]

## 2024-04-13 ENCOUNTER — Other Ambulatory Visit: Payer: Self-pay | Admitting: Physician Assistant

## 2024-04-13 MED ORDER — ZEPBOUND 2.5 MG/0.5ML ~~LOC~~ SOAJ: 2.5000 mg | SUBCUTANEOUS | 0 refills | Status: DC

## 2024-04-13 MED ORDER — ZEPBOUND 5 MG/0.5ML ~~LOC~~ SOAJ: 5.0000 mg | SUBCUTANEOUS | 0 refills | Status: DC

## 2024-04-13 NOTE — Telephone Encounter (Signed)
 Spoke to pt asked her if she has been taking Zepbound  2.5 mg? Pt said yes started last month. Asked her if she paid out of pocket? Pt said no insurance covered. Asked her if PA was done? She said yes thru the [employer]. Told her okay. Asked her if she wanted to increase dose to 5 mg? Pt said she would like to stay 2.5 mg this month and increase next month, but go ahead and send in next Rx. Told her okay I will send 2.5 mg and 5 mg to [facility] and let me know when you want next dose. Pt verbalized understanding.

## 2024-04-13 NOTE — Telephone Encounter (Signed)
 Copied from CRM [id number]. Topic: Clinical - Medication Refill >> Apr 13, 2024 11:00 AM Martinique E wrote: Medication: tirzepatide  (ZEPBOUND ) 2.5 MG/0.5ML Pen  Has the patient contacted their pharmacy? Yes (Agent: If no, request that the patient contact the pharmacy for the refill. If patient does not wish to contact the pharmacy document the reason why and proceed with request.) (Agent: If yes, when and what did the pharmacy advise?)  This is the patient's preferred pharmacy:   [facility] [place], [place] - [address] [place] [address] Phone: [phone] Fax: [phone]   Is this the correct pharmacy for this prescription? Yes If no, delete pharmacy and type the correct one.   Has the prescription been filled recently? No  Is the patient out of the medication? Yes  Has the patient been seen for an appointment in the last year OR does the patient have an upcoming appointment? Yes  Can we respond through MyChart? Yes  Agent: Please be advised that Rx refills may take up to 3 business days. We ask that you follow-up with your pharmacy.

## 2024-05-11 ENCOUNTER — Other Ambulatory Visit: Payer: Self-pay | Admitting: Physician Assistant

## 2024-05-14 ENCOUNTER — Other Ambulatory Visit: Payer: Self-pay | Admitting: Physician Assistant

## 2024-05-17 ENCOUNTER — Encounter (HOSPITAL_BASED_OUTPATIENT_CLINIC_OR_DEPARTMENT_OTHER): Payer: Self-pay

## 2024-05-17 ENCOUNTER — Other Ambulatory Visit (HOSPITAL_BASED_OUTPATIENT_CLINIC_OR_DEPARTMENT_OTHER): Payer: Self-pay

## 2024-05-31 ENCOUNTER — Other Ambulatory Visit (HOSPITAL_BASED_OUTPATIENT_CLINIC_OR_DEPARTMENT_OTHER): Payer: Self-pay

## 2024-06-01 ENCOUNTER — Other Ambulatory Visit: Payer: Self-pay

## 2024-06-14 ENCOUNTER — Other Ambulatory Visit: Payer: Self-pay | Admitting: Physician Assistant

## 2024-06-14 MED ORDER — ZEPBOUND 7.5 MG/0.5ML ~~LOC~~ SOAJ: 7.5000 mg | SUBCUTANEOUS | 1 refills | Status: DC

## 2024-06-14 NOTE — Addendum Note (Signed)
 Addended by: THURMON ARLAND PARAS on: 06/14/2024 09:35 AM   Modules accepted: Orders

## 2024-06-18 ENCOUNTER — Other Ambulatory Visit (HOSPITAL_BASED_OUTPATIENT_CLINIC_OR_DEPARTMENT_OTHER): Payer: Self-pay

## 2024-06-20 ENCOUNTER — Other Ambulatory Visit (HOSPITAL_BASED_OUTPATIENT_CLINIC_OR_DEPARTMENT_OTHER): Payer: Self-pay

## 2024-06-21 ENCOUNTER — Other Ambulatory Visit (HOSPITAL_BASED_OUTPATIENT_CLINIC_OR_DEPARTMENT_OTHER): Payer: Self-pay

## 2024-06-21 ENCOUNTER — Encounter (HOSPITAL_BASED_OUTPATIENT_CLINIC_OR_DEPARTMENT_OTHER): Payer: Self-pay

## 2024-06-23 ENCOUNTER — Ambulatory Visit: Admitting: Physician Assistant

## 2024-07-08 ENCOUNTER — Encounter (HOSPITAL_BASED_OUTPATIENT_CLINIC_OR_DEPARTMENT_OTHER): Payer: Self-pay

## 2024-07-08 ENCOUNTER — Other Ambulatory Visit: Payer: Self-pay

## 2024-07-08 ENCOUNTER — Other Ambulatory Visit (HOSPITAL_BASED_OUTPATIENT_CLINIC_OR_DEPARTMENT_OTHER): Payer: Self-pay

## 2024-07-08 MED ORDER — PHEXXI 1.8-1-0.4 % VA GEL
1.0000 | Freq: Every day | VAGINAL | 4 refills | Status: AC
  Filled 2024-07-08 – 2024-07-22 (×2): qty 120, 24d supply, fill #0

## 2024-07-15 ENCOUNTER — Ambulatory Visit: Admitting: Physician Assistant

## 2024-07-19 ENCOUNTER — Ambulatory Visit: Admitting: Physician Assistant

## 2024-07-21 ENCOUNTER — Ambulatory Visit: Payer: Self-pay | Admitting: Physician Assistant

## 2024-07-21 ENCOUNTER — Other Ambulatory Visit (HOSPITAL_BASED_OUTPATIENT_CLINIC_OR_DEPARTMENT_OTHER): Payer: Self-pay

## 2024-07-21 ENCOUNTER — Other Ambulatory Visit: Payer: Self-pay | Admitting: Physician Assistant

## 2024-07-21 ENCOUNTER — Encounter: Payer: Self-pay | Admitting: Physician Assistant

## 2024-07-21 ENCOUNTER — Ambulatory Visit: Admitting: Physician Assistant

## 2024-07-21 VITALS — BP 122/80 | HR 94 | Temp 97.0°F | Ht 64.0 in | Wt 278.4 lb

## 2024-07-21 DIAGNOSIS — F411 Generalized anxiety disorder: Secondary | ICD-10-CM

## 2024-07-21 DIAGNOSIS — E559 Vitamin D deficiency, unspecified: Secondary | ICD-10-CM

## 2024-07-21 DIAGNOSIS — D509 Iron deficiency anemia, unspecified: Secondary | ICD-10-CM

## 2024-07-21 DIAGNOSIS — E669 Obesity, unspecified: Secondary | ICD-10-CM | POA: Diagnosis not present

## 2024-07-21 LAB — VITAMIN D 25 HYDROXY (VIT D DEFICIENCY, FRACTURES): VITD: 22.15 ng/mL — ABNORMAL LOW (ref 30.00–100.00)

## 2024-07-21 MED ORDER — VITAMIN D (ERGOCALCIFEROL) 1.25 MG (50000 UNIT) PO CAPS
50000.0000 [IU] | ORAL_CAPSULE | ORAL | 0 refills | Status: DC
  Filled 2024-07-21: qty 4, 28d supply, fill #0

## 2024-07-21 NOTE — Progress Notes (Signed)
 Lisa Bolton is a 38 y.o. female here for a follow up of a pre-existing problem.  History of Present Illness:   Chief Complaint  Patient presents with   Weight Management Screening    Pt here for f/u on Zepbound  5 mg weekly.     Discussed the use of AI scribe software for clinical note transcription with the patient, who gave verbal consent to proceed.  History of Present Illness   Lisa Bolton is a 38 year old female who presents for a follow-up on her weight loss journey and vitamin D  levels.  She has lost 28 pounds since July, attributing this to Zepbound , which she finds more effective than Wegovy  due to less nausea. Morning nausea occurs, especially when brushing her teeth, triggering a gag reflex. Zofran  is used to manage nausea but worsens constipation, which she counteracts with Miralax around her Zepbound  injections. She is on a 5 mg dose of Zepbound , having completed a month on 2.5 mg, and is in her third month on 5 mg. Weight loss continues despite a break from the medication during a trip to [place] and a birth control scare.  She monitors her vitamin D  levels, having completed a three-month course, and reports increased optimism and ambition. She describes the effect as a sustained feeling of health and capability.  Her iron levels were recently checked and found to be mildly low. She is considering taking a prenatal vitamin for its iron content and inquires about any contraindications. She discusses a new iron supplement, Ocrever, designed to minimize gastrointestinal side effects.  She is currently taking Lexapro  without issues and inquires about using omeprazole for indigestion while on Zepbound . In her social history, she uses condoms for birth control and had a recent incident requiring Plan B. She works from home and is considering counseling for anxiety, exploring options for a counselor who accepts insurance.        Past Medical History:  Diagnosis Date    Allergy     Anemia    Anxiety    Back pain    Chronic vaginitis    Constipation    Family history of anesthesia complication    UNCLE WITH  PSUEDOCHOLINESTEROSE  DEFIENCY   Food allergy     GERD (gastroesophageal reflux disease)    History of thyroid  cancer    Malignant neoplasm of thyroid  gland (HCC) 10/11/2012   Palpitations    Panic attacks    Recurrent boils, vulva    Vitamin D  deficiency      Social History   Tobacco Use   Smoking status: Never   Smokeless tobacco: Never  Vaping Use   Vaping status: Never Used  Substance Use Topics   Alcohol use: Yes    Alcohol/week: 2.0 standard drinks of alcohol    Types: 2 Standard drinks or equivalent per week    Comment: socially   Drug use: No    Past Surgical History:  Procedure Laterality Date   ANTERIOR CRUCIATE LIGAMENT REPAIR Left 2001 - approximate   LYMPH NODE DISSECTION  10/29/2012   Procedure: LYMPH NODE DISSECTION;  Surgeon: Elon CHRISTELLA Pacini, MD;  Location: [facility] OR;  Service: General;  Laterality: N/A;  central compartment lymph node dissection    THYROID  LOBECTOMY  09/21/2012   Procedure: THYROID  LOBECTOMY;  Surgeon: Elon CHRISTELLA Pacini, MD;  Location: [facility] ORS;  Service: General;  Laterality: Right;  Right Thyroid  Lobectomy   THYROID  LOBECTOMY  10/29/2012   Procedure: THYROID  LOBECTOMY;  Surgeon: Elon CHRISTELLA Pacini,  MD;  Location: [facility] OR;  Service: General;  Laterality: Left;  left thyroid  lobectomy,central compartmental lymph node dissection    Family History  Problem Relation Age of Onset   Squamous cell carcinoma Mother        skin   Hypertension Mother    Heart attack Mother    Multiple sclerosis Mother    Thyroid  disease Mother    High Cholesterol Mother    Anxiety disorder Mother    Obesity Mother    Cancer Mother    Heart disease Mother    Obesity Father    Anxiety disorder Brother    Diabetes type I Brother        Juvenile   Diabetes Brother    Hypertension Brother    Diabetes Brother    Obesity  Brother    Arthritis Maternal Grandmother    Memory loss Maternal Grandfather    Heart disease Paternal Grandmother    Parkinson's disease Paternal Grandmother    Heart attack Paternal Grandfather    Allergic rhinitis Neg Hx    Angioedema Neg Hx    Asthma Neg Hx    Eczema Neg Hx    Colon cancer Neg Hx    Breast cancer Neg Hx     Allergies  Allergen Reactions   Shellfish Allergy     Other Other (See Comments)    Anethesia   Pertussis Vaccines     Current Medications:   Current Outpatient Medications:    clonazePAM  (KLONOPIN ) 0.5 MG tablet, Take 1 tablet (0.5 mg total) by mouth 2 (two) times daily as needed. for anxiety, Disp: 30 tablet, Rfl: 1   EPINEPHrine  0.3 mg/0.3 mL IJ SOAJ injection, INJECT AS DIRECTED FOR SEVERE ALLERGIC REACTION, Disp: 2 each, Rfl: 1   escitalopram  (LEXAPRO ) 10 MG tablet, Take 1 tablet (10 mg total) by mouth daily., Disp: 90 tablet, Rfl: 3   Lactic Ac-Citric Ac-Pot Bitart (PHEXXI) 1.8-1-0.4 % GEL, Place 1 Applicatorful vaginally daily., Disp: 120 g, Rfl: 4   levothyroxine  (SYNTHROID ) 150 MCG tablet, Take 1 tablet (150 mcg total) by mouth in the morning on an empty stomach, Disp: 90 tablet, Rfl: 3   loratadine (CLARITIN) 10 MG tablet, Take 10 mg by mouth daily., Disp: , Rfl:    methocarbamol  (ROBAXIN ) 500 MG tablet, Take 1 tablet (500 mg total) by mouth every 8 (eight) hours as needed for muscle spasms., Disp: 30 tablet, Rfl: 0   metroNIDAZOLE  (METROGEL ) 1 % gel, Apply 1 Application topically to face daily. (Patient taking differently: Apply 1 Application topically as needed.), Disp: 60 g, Rfl: 3   Multiple Vitamin (MULTI-VITAMIN DAILY PO), Take 1 tablet by mouth daily in the afternoon., Disp: , Rfl:    ondansetron  (ZOFRAN -ODT) 4 MG disintegrating tablet, Take 4 mg by mouth every 8 (eight) hours as needed., Disp: , Rfl:    ZEPBOUND  5 MG/0.5ML Pen, Inject 5 mg into the skin once a week., Disp: 2 mL, Rfl: 0   Ivermectin  1 % CREA, Apply 1 Application topically  to affected areas on face daily. (Patient not taking: Reported on 07/21/2024), Disp: 45 g, Rfl: 0   tirzepatide  (ZEPBOUND ) 7.5 MG/0.5ML Pen, Inject 7.5 mg into the skin once a week. (Patient not taking: Reported on 07/21/2024), Disp: 2 mL, Rfl: 1   Review of Systems:   Negative unless otherwise specified per HPI.  Vitals:   Vitals:   07/21/24 0857  BP: 122/80  Pulse: 94  Temp: (!) 97 F (36.1 C)  TempSrc: Temporal  SpO2: 95%  Weight: 278 lb 6.1 oz (126.3 kg)  Height: 5' 4 (1.626 m)     Body mass index is 47.78 kg/m.  Physical Exam:   Physical Exam Constitutional:      Appearance: Normal appearance. She is well-developed.  HENT:     Head: Normocephalic and atraumatic.  Eyes:     General: Lids are normal.     Extraocular Movements: Extraocular movements intact.     Conjunctiva/sclera: Conjunctivae normal.  Pulmonary:     Effort: Pulmonary effort is normal.  Musculoskeletal:        General: Normal range of motion.     Cervical back: Normal range of motion and neck supple.  Skin:    General: Skin is warm and dry.  Neurological:     Mental Status: She is alert and oriented to person, place, and time.  Psychiatric:        Attention and Perception: Attention and perception normal.        Mood and Affect: Mood normal.        Behavior: Behavior normal.        Thought Content: Thought content normal.        Judgment: Judgment normal.     Assessment and Plan:   Assessment and Plan    Obesity Lost 28 pounds since July with Zepbound . Better tolerance compared to Wegovy . Considering dose increase but advised to maintain current dose due to ongoing weight loss and side effects. - Continue Zepbound  5 mg. - Use Zofran  for nausea as needed. - Take Miralax around Zepbound  injection to manage constipation. - Consider omeprazole for indigestion. - Encourage small, frequent meals and regular physical activity.  Iron deficiency anemia Ferritin levels remain low at 12.  Experiences low iron levels post-menstruation. Considering prenatal vitamins for supplementation. - Start prenatal vitamins for iron supplementation.  Vitamin D  deficiency Reports positive effects from vitamin D  supplementation with increased energy and optimism. - Check vitamin D  levels.  Generalized anxiety disorder  Currently on Lexapro , reports it is effective. Needs counseling for anxiety and mental health concerns. - Refer to Leita Hand at [facility] for therapy. - Consider [facility] as an alternative.   Lucie Buttner, PA-C

## 2024-07-22 ENCOUNTER — Other Ambulatory Visit (HOSPITAL_BASED_OUTPATIENT_CLINIC_OR_DEPARTMENT_OTHER): Payer: Self-pay

## 2024-08-20 ENCOUNTER — Other Ambulatory Visit (HOSPITAL_BASED_OUTPATIENT_CLINIC_OR_DEPARTMENT_OTHER): Payer: Self-pay

## 2024-09-23 ENCOUNTER — Telehealth: Payer: Self-pay | Admitting: *Deleted

## 2024-09-23 ENCOUNTER — Other Ambulatory Visit (HOSPITAL_COMMUNITY): Payer: Self-pay

## 2024-09-23 ENCOUNTER — Telehealth: Payer: Self-pay

## 2024-09-23 NOTE — Telephone Encounter (Signed)
 Please do PA for Zepbound  7.5 m g. PA expired in Nov. Do PA thru [facility].

## 2024-09-23 NOTE — Telephone Encounter (Signed)
 Spoke to pt clarified what dose she is on. Told her I will send message to PA Team to do another PA. Pt verbalized understanding.

## 2024-09-23 NOTE — Telephone Encounter (Signed)
 Pharmacy Patient Advocate Encounter   Received notification from Pt Calls Messages that prior authorization for Zepbound  7.5MG /0.5ML pen-injectors is required/requested.   Insurance verification completed.   The patient is insured through [facility] .   Per test claim: PA required; PA submitted to above mentioned insurance via Latent Key/confirmation #/[id number] Status is pending

## 2024-09-23 NOTE — Telephone Encounter (Signed)
 Copied from CRM #[id number]. Topic: Clinical - Medication Prior Auth >> Sep 23, 2024 12:09 PM Carlyon D wrote: Reason for CRM: pt is calling in regards to her script ZEPBOUND  7.5 MG/0.5ML Pen [facility] states they have sent the max amount of PA attempts. Pt is asking if this PA can get done so she can get her script. Any further questions please reach out to pt

## 2024-09-27 ENCOUNTER — Other Ambulatory Visit (HOSPITAL_COMMUNITY): Payer: Self-pay

## 2024-09-27 NOTE — Telephone Encounter (Signed)
 Pharmacy Patient Advocate Encounter  Received notification from [facility] that Prior Authorization for Zepbound  7.5MG /0.5ML pen-injectors  has been APPROVED from 09/20/24 to 03/21/25. Ran test claim, Copay is $250. This test claim was processed through [facility]- copay amounts may vary at other pharmacies due to pharmacy/plan contracts, or as the patient moves through the different stages of their insurance plan.   PA #/Case ID/Reference #: [id number]

## 2024-09-28 NOTE — Telephone Encounter (Signed)
 Patient notified of Zepbound   being approved via MyChart.

## 2024-10-04 NOTE — Telephone Encounter (Signed)
 Patient asked to confirm that [facility] [place], [place] - [address] [place] [address] Phone: [phone]  Fax: [phone] is her current pharmacy to send zepbound  to via MyChart.

## 2024-10-11 ENCOUNTER — Other Ambulatory Visit: Payer: Self-pay

## 2024-10-11 ENCOUNTER — Ambulatory Visit
Admission: EM | Admit: 2024-10-11 | Discharge: 2024-10-11 | Disposition: A | Discharge status: Home / Self Care | Attending: Physician Assistant | Admitting: Physician Assistant

## 2024-10-11 DIAGNOSIS — R6889 Other general symptoms and signs: Secondary | ICD-10-CM | POA: Diagnosis not present

## 2024-10-11 MED ORDER — ALBUTEROL SULFATE HFA 108 (90 BASE) MCG/ACT IN AERS: 1.0000 | INHALATION_SPRAY | Freq: Four times a day (QID) | RESPIRATORY_TRACT | 0 refills | Status: AC | PRN

## 2024-10-11 MED ORDER — OSELTAMIVIR PHOSPHATE 75 MG PO CAPS: 75.0000 mg | ORAL_CAPSULE | Freq: Two times a day (BID) | ORAL | 0 refills | Status: DC

## 2024-10-11 MED ORDER — AEROCHAMBER PLUS FLO-VU MEDIUM MISC
1.0000 | Freq: Once | Status: AC
  Administered 2024-10-11: 1

## 2024-10-11 NOTE — ED Provider Notes (Signed)
 " [facility]    CSN: [id number] Arrival date & time: 10/11/24  1847      History   Chief Complaint Chief Complaint  Patient presents with   Influenza    HPI Lisa Bolton is a 39 y.o. female.   HPI Pt is here today with concerns for flu like illness. She states she is having nasal congestion, deep coughing and would like something to help with her congestion and the coughing,. She reports testing positive for the flu with home test on 10/09/24.  She reports some improvement with drinking tea currently in clinic Interventions: She has been taking Claritin daily, Delsym, Mucinex was started today, Ibuprofen, Flonase  started last night. She tried Afrin on Sunday and used it yesterday as well but is not sure if she should continue to use it.    Past Medical History:  Diagnosis Date   Allergy     Anemia    Anxiety    Back pain    Chronic vaginitis    Constipation    Family history of anesthesia complication    UNCLE WITH  PSUEDOCHOLINESTEROSE  DEFIENCY   Food allergy     GERD (gastroesophageal reflux disease)    History of thyroid  cancer    Malignant neoplasm of thyroid  gland (HCC) 10/11/2012   Palpitations    Panic attacks    Recurrent boils, vulva    Vitamin D  deficiency     Patient Active Problem List   Diagnosis Date Noted   Prediabetes 02/22/2024   Abnormal levels of other serum enzymes 03/29/2022   Chronic anal fissure 03/29/2022   Gastroesophageal reflux disease 03/29/2022   Vitamin D  insufficiency 02/17/2022   Acquired hypothyroidism 02/17/2022   Visceral fat 02/17/2022   Piriformis syndrome of left side 10/24/2020   Nonallopathic lesion of sacral region 07/23/2020   Nonallopathic lesion of lumbar region 07/23/2020   Nonallopathic lesion of thoracic region 07/23/2020   Hip flexor tightness, right 06/13/2020   Non-seasonal allergic rhinitis 08/20/2018   Papular urticaria 08/20/2018   GAD (generalized anxiety disorder) 06/20/2018   Morbid  obesity (HCC) 06/20/2018    Past Surgical History:  Procedure Laterality Date   ANTERIOR CRUCIATE LIGAMENT REPAIR Left 2001 - approximate   LYMPH NODE DISSECTION  10/29/2012   Procedure: LYMPH NODE DISSECTION;  Surgeon: Elon CHRISTELLA Pacini, MD;  Location: [facility] OR;  Service: General;  Laterality: N/A;  central compartment lymph node dissection    THYROID  LOBECTOMY  09/21/2012   Procedure: THYROID  LOBECTOMY;  Surgeon: Elon CHRISTELLA Pacini, MD;  Location: [facility];  Service: General;  Laterality: Right;  Right Thyroid  Lobectomy   THYROID  LOBECTOMY  10/29/2012   Procedure: THYROID  LOBECTOMY;  Surgeon: Elon CHRISTELLA Pacini, MD;  Location: [facility] OR;  Service: General;  Laterality: Left;  left thyroid  lobectomy,central compartmental lymph node dissection    OB History     Gravida  0   Para  0   Term  0   Preterm  0   AB  0   Living  0      SAB  0   IAB  0   Ectopic  0   Multiple  0   Live Births  0            Home Medications    Prior to Admission medications  Medication Sig Start Date End Date Taking? Authorizing Provider  albuterol  (VENTOLIN  HFA) 108 (90 Base) MCG/ACT inhaler Inhale 1-2 puffs into the lungs every 6 (six) hours as needed for  wheezing or shortness of breath. 10/11/24  Yes Kamela Blansett E, PA-C  oseltamivir  (TAMIFLU ) 75 MG capsule Take 1 capsule (75 mg total) by mouth every 12 (twelve) hours. 10/11/24  Yes Karsten Vaughn E, PA-C  clonazePAM  (KLONOPIN ) 0.5 MG tablet Take 1 tablet (0.5 mg total) by mouth 2 (two) times daily as needed. for anxiety 02/09/24   Job Lukes, PA  EPINEPHrine  0.3 mg/0.3 mL IJ SOAJ injection INJECT AS DIRECTED FOR SEVERE ALLERGIC REACTION 07/08/23   Job Lukes, PA  escitalopram  (LEXAPRO ) 10 MG tablet Take 1 tablet (10 mg total) by mouth daily. 02/16/24   Job Lukes, PA  Ivermectin  1 % CREA Apply 1 Application topically to affected areas on face daily. Patient not taking: Reported on 07/21/2024 12/09/23     Lactic Ac-Citric Ac-Pot Bitart  (PHEXXI ) 1.8-1-0.4 % GEL Place 1 Applicatorful vaginally daily. 07/08/24     levothyroxine  (SYNTHROID ) 150 MCG tablet Take 1 tablet (150 mcg total) by mouth in the morning on an empty stomach 05/14/23     loratadine (CLARITIN) 10 MG tablet Take 10 mg by mouth daily.    [provider]  methocarbamol  (ROBAXIN ) 500 MG tablet Take 1 tablet (500 mg total) by mouth every 8 (eight) hours as needed for muscle spasms. 01/23/23   Job Lukes, PA  metroNIDAZOLE  (METROGEL ) 1 % gel Apply 1 Application topically to face daily. Patient taking differently: Apply 1 Application topically as needed. 12/09/23     Multiple Vitamin (MULTI-VITAMIN DAILY PO) Take 1 tablet by mouth daily in the afternoon.    [provider]  ondansetron  (ZOFRAN -ODT) 4 MG disintegrating tablet Take 4 mg by mouth every 8 (eight) hours as needed. 07/20/24   [provider]  tirzepatide  (ZEPBOUND ) 7.5 MG/0.5ML Pen Inject 7.5 mg into the skin once a week. Patient not taking: Reported on 07/21/2024 07/14/24   Job Lukes, PA  Vitamin D , Ergocalciferol , (DRISDOL ) 1.25 MG (50000 UNIT) CAPS capsule Take 1 capsule (50,000 Units total) by mouth every 7 (seven) days. 07/21/24   Job Lukes, PA  ZEPBOUND  5 MG/0.5ML Pen Inject 5 mg into the skin once a week. 06/14/24   Job Lukes, PA    Family History Family History  Problem Relation Age of Onset   Squamous cell carcinoma Mother        skin   Hypertension Mother    Heart attack Mother    Multiple sclerosis Mother    Thyroid  disease Mother    High Cholesterol Mother    Anxiety disorder Mother    Obesity Mother    Cancer Mother    Heart disease Mother    Obesity Father    Anxiety disorder Brother    Diabetes type I Brother        Juvenile   Diabetes Brother    Hypertension Brother    Diabetes Brother    Obesity Brother    Arthritis Maternal Grandmother    Memory loss Maternal Grandfather    Heart disease Paternal Grandmother    Parkinson's  disease Paternal Grandmother    Heart attack Paternal Grandfather    Allergic rhinitis Neg Hx    Angioedema Neg Hx    Asthma Neg Hx    Eczema Neg Hx    Colon cancer Neg Hx    Breast cancer Neg Hx     Social History Social History[1]   Allergies   Shellfish allergy , Other, and Pertussis vaccines   Review of Systems Review of Systems  Constitutional:  Negative for chills and  fever.  HENT:  Positive for congestion, postnasal drip, rhinorrhea and sinus pressure.   Respiratory:  Positive for cough. Negative for shortness of breath and wheezing.      Physical Exam Triage Vital Signs ED Triage Vitals  Encounter Vitals Group     BP 10/11/24 1945 131/89     Girls Systolic BP Percentile --      Girls Diastolic BP Percentile --      Boys Systolic BP Percentile --      Boys Diastolic BP Percentile --      Pulse Rate 10/11/24 1945 97     Resp 10/11/24 1945 18     Temp 10/11/24 1945 98.3 F (36.8 C)     Temp Source 10/11/24 1945 Oral     SpO2 10/11/24 1945 96 %     Weight --      Height --      Head Circumference --      Peak Flow --      Pain Score 10/11/24 1944 0     Pain Loc --      Pain Education --      Exclude from Growth Chart --    No data found.  Updated Vital Signs BP 131/89 (BP Location: Right Arm)   Pulse 97   Temp 98.3 F (36.8 C) (Oral)   Resp 18   LMP 10/10/2024 (Exact Date)   SpO2 96%   Visual Acuity Right Eye Distance:   Left Eye Distance:   Bilateral Distance:    Right Eye Near:   Left Eye Near:    Bilateral Near:     Physical Exam Vitals reviewed.  Constitutional:      General: She is awake. She is not in acute distress.    Appearance: Normal appearance. She is well-developed and well-groomed. She is not ill-appearing, toxic-appearing or diaphoretic.  HENT:     Head: Normocephalic and atraumatic.     Right Ear: Hearing, tympanic membrane and ear canal normal.     Left Ear: Hearing, tympanic membrane and ear canal normal.      Mouth/Throat:     Lips: Pink.     Mouth: Mucous membranes are moist.     Pharynx: Oropharynx is clear. Uvula midline. No pharyngeal swelling, oropharyngeal exudate, posterior oropharyngeal erythema, uvula swelling or postnasal drip.  Cardiovascular:     Rate and Rhythm: Normal rate and regular rhythm.     Pulses: Normal pulses.          Radial pulses are 2+ on the right side and 2+ on the left side.     Heart sounds: Normal heart sounds. No murmur heard.    No friction rub. No gallop.  Pulmonary:     Effort: Pulmonary effort is normal.     Breath sounds: Normal breath sounds. No decreased air movement. No decreased breath sounds, wheezing, rhonchi or rales.  Musculoskeletal:     Cervical back: Normal range of motion and neck supple.  Lymphadenopathy:     Head:     Right side of head: No submental, submandibular or preauricular adenopathy.     Left side of head: No submental, submandibular or preauricular adenopathy.     Cervical:     Right cervical: No superficial cervical adenopathy.    Left cervical: No superficial cervical adenopathy.     Upper Body:     Right upper body: No supraclavicular adenopathy.     Left upper body: No supraclavicular adenopathy.  Neurological:  Mental Status: She is alert.  Psychiatric:        Mood and Affect: Mood normal.        Behavior: Behavior normal. Behavior is cooperative.        Thought Content: Thought content normal.        Judgment: Judgment normal.      UC Treatments / Results  Labs (all labs ordered are listed, but only abnormal results are displayed) Labs Reviewed - No data to display   EKG   Radiology No results found.  Procedures Procedures (including critical care time)  Medications Ordered in UC Medications  AeroChamber Plus Flo-Vu Medium MISC 1 each (1 each Other Given 10/11/24 2027)    Initial Impression / Assessment and Plan / UC Course  I have reviewed the triage vital signs and the nursing  notes.  Pertinent labs & imaging results that were available during my care of the patient were reviewed by me and considered in my medical decision making (see chart for details).      Final Clinical Impressions(s) / UC Diagnoses   Final diagnoses:  Flu-like symptoms    Patient is here today with concerns for flulike symptoms stating that she is having nasal congestion, coughing.  She reports testing positive for the flu on 10/09/2024 with a home test and would like medication to help with her symptoms.  Patient is still within 48-hour window for Tamiflu  so we will send prescription for this send as well as albuterol  rescue inhaler and spacer.  Recommend continued use of OTC medications for further symptomatic relief.  ED and return cautions reviewed and provided in AVS.  Follow-up as needed.   Discharge Instructions      You are having symptoms consistent with the Flu. Your home testing was positive for the flu so we are treating you for this today with an antiviral medication called tamiflu  and an albuterol  rescue inhaler.    Symptoms can last for 3-10 days with lingering cough and intermittent symptoms potentially  lasting several  weeks after that.  The goal of treatment at this time is to reduce your symptoms and discomfort   You can use the following medications and measures to help yourself feel better until your body fights this off: DayQuil/NyQuil, TheraFlu, Alka-Seltzer  (these medications typically have the same active ingredients in them so you can choose whichever one you prefer and take consistently during the day and night according to the manufactures instructions.) Flonase  A daily antihistamine such as Zyrtec, Claritin, Allegra per your preference.  Please choose 1 and take consistently. Increased fluids.  It is recommended that you take in at least 64 ounces of water per day when you are not sick so it is important to increase this when you are sick and your body may be  running fever. Rest Cough drops Chloraseptic throat spray to help with sore throat Nasal saline spray or nasal flushes to help with congestion and runny nose  If your symptoms seem like they are getting worse over the next 5 to 7 days or not improving you can always follow-up here in urgent care or go to your primary care provider for further management. Go to the ER if you begin to have more serious symptoms such as shortness of breath, trouble breathing, loss of consciousness, swelling around the eyes, high fever, severe lasting headaches, vision changes or neck pain/stiffness.       ED Prescriptions     Medication Sig Dispense Auth. Provider  oseltamivir  (TAMIFLU ) 75 MG capsule Take 1 capsule (75 mg total) by mouth every 12 (twelve) hours. 10 capsule Neev Mcmains E, PA-C   albuterol  (VENTOLIN  HFA) 108 (90 Base) MCG/ACT inhaler Inhale 1-2 puffs into the lungs every 6 (six) hours as needed for wheezing or shortness of breath. 8 g Cenia Zaragosa E, PA-C      PDMP not reviewed this encounter.     [1]  Social History Tobacco Use   Smoking status: Never   Smokeless tobacco: Never  Vaping Use   Vaping status: Never Used  Substance Use Topics   Alcohol use: Yes    Alcohol/week: 2.0 standard drinks of alcohol    Types: 2 Standard drinks or equivalent per week    Comment: socially   Drug use: No     Angelyne Terwilliger, [place] BRAVO, PA-C 10/14/24 1135  "

## 2024-10-11 NOTE — ED Triage Notes (Signed)
 Pt presents to urgent care after testing positive for flu A on Sunday 1/4. States she felt very congested and nose was pulsating the morning of 1/4 which prompted her to test. Still experiencing runny nose and a cough. Would like to see if there is a prescription medication to open her nasal passages up. Took Flonase , Mucinex, Delsym, and Ibuprofen at home for sxs. Felt very flushed yesterday. Assumes she had a fever.

## 2024-10-11 NOTE — Discharge Instructions (Addendum)
 You are having symptoms consistent with the Flu. Your home testing was positive for the flu so we are treating you for this today with an antiviral medication called tamiflu  and an albuterol  rescue inhaler.    Symptoms can last for 3-10 days with lingering cough and intermittent symptoms potentially  lasting several  weeks after that.  The goal of treatment at this time is to reduce your symptoms and discomfort   You can use the following medications and measures to help yourself feel better until your body fights this off: DayQuil/NyQuil, TheraFlu, Alka-Seltzer  (these medications typically have the same active ingredients in them so you can choose whichever one you prefer and take consistently during the day and night according to the manufactures instructions.) Flonase  A daily antihistamine such as Zyrtec, Claritin, Allegra per your preference.  Please choose 1 and take consistently. Increased fluids.  It is recommended that you take in at least 64 ounces of water per day when you are not sick so it is important to increase this when you are sick and your body may be running fever. Rest Cough drops Chloraseptic throat spray to help with sore throat Nasal saline spray or nasal flushes to help with congestion and runny nose  If your symptoms seem like they are getting worse over the next 5 to 7 days or not improving you can always follow-up here in urgent care or go to your primary care provider for further management. Go to the ER if you begin to have more serious symptoms such as shortness of breath, trouble breathing, loss of consciousness, swelling around the eyes, high fever, severe lasting headaches, vision changes or neck pain/stiffness.

## 2024-10-20 ENCOUNTER — Encounter: Payer: Self-pay | Admitting: Physician Assistant

## 2024-10-21 ENCOUNTER — Ambulatory Visit: Payer: Self-pay

## 2024-10-21 ENCOUNTER — Ambulatory Visit: Admitting: Family

## 2024-10-21 ENCOUNTER — Other Ambulatory Visit (HOSPITAL_BASED_OUTPATIENT_CLINIC_OR_DEPARTMENT_OTHER): Payer: Self-pay

## 2024-10-21 MED ORDER — AZITHROMYCIN 250 MG PO TABS
ORAL_TABLET | ORAL | 0 refills | Status: DC
  Filled 2024-10-21: qty 6, 5d supply, fill #0

## 2024-10-21 MED ORDER — METHYLPREDNISOLONE 4 MG PO TBPK
ORAL_TABLET | ORAL | 0 refills | Status: DC
  Filled 2024-10-21: qty 21, 6d supply, fill #0

## 2024-10-21 NOTE — Telephone Encounter (Signed)
 FYI Only or Action Required?: FYI only for provider: appointment scheduled on today.  Patient was last seen in primary care on 07/21/2024 by Job Lukes, PA.  Called Nurse Triage reporting Influenza.  Symptoms began several weeks ago.  Interventions attempted: Prescription medications: albuterol  and Rest, hydration, or home remedies.  Symptoms are: gradually improving.  Triage Disposition: See HCP Within 4 Hours (Or PCP Triage)  Patient/caregiver understands and will follow disposition?: Yes, but will wait Reason for Disposition  [1] Nasal discharge AND [2] present > 10 days  [1] All other patients AND [2] now alert and feels fine  (Exception: SIMPLE FAINT due to stress, pain, prolonged standing, or suddenly standing)  Answer Assessment - Initial Assessment Questions Additional info: Patient reports she is recovering from the flu, symptoms for two weeks. She is calling today to schedule an appointment for persistent deep non productive cough. Patient reports this morning while in the shower she had a coughing fit and felt faint, she got herself out of the shower to sit on toilet and then lost consciousness, she denies all injuries, reports she felt normal after regaining consciousness. She reports history of simple faints when showering and rushing to get ready, she thinks anxiety may play a role in her symptoms but reports she is not feeling particularly anxious today.  Acute visit scheduled today. Discussed ED precautions.    1. ONSET: When did the cough begin?      2 weeks ago 2. SEVERITY: How bad is the cough today?      Strong and deep  3. SPUTUM: Describe the color of your sputum (e.g., none, dry cough; clear, white, yellow, green)     Occasional clear phlegm 4. HEMOPTYSIS: Are you coughing up any blood? If Yes, ask: How much? (e.g., flecks, streaks, tablespoons, etc.)     denies 5. DIFFICULTY BREATHING: Are you having difficulty breathing? If Yes, ask: How  bad is it? (e.g., mild, moderate, severe)      Only during cough fit-albuterol -not sure if it is helping 6. FEVER: Do you have a fever? If Yes, ask: What is your temperature, how was it measured, and when did it start?     denies 7. CARDIAC HISTORY: Do you have any history of heart disease? (e.g., heart attack, congestive heart failure)       8. LUNG HISTORY: Do you have any history of lung disease?  (e.g., pulmonary embolus, asthma, emphysema)      9. PE RISK FACTORS: Do you have a history of blood clots? (or: recent major surgery, recent prolonged travel, bedridden)      10. OTHER SYMPTOMS: Do you have any other symptoms? (e.g., runny nose, wheezing, chest pain)     Passed out this morning when she was in the shower during coughing fit, she felt faint, got out and sat on toilet she fainted, no injury. Felt fine when she got. She does have a history of simple fainting during shower. Denies chest pain.  Protocols used: Cough - Acute Non-Productive-A-AH, Fainting-A-AH    Copied from [id number]. Topic: Clinical - Red Word Triage >> Oct 21, 2024  8:07 AM Treva T wrote: Red Word that prompted transfer to Nurse Triage: Reason for Triage: Pt calling reporting she had the flu about 2 weeks ago, has a lingering, worsening deep cough, also reports she fainted this morning when getting out of the shower.   Please warm transfer Red Word call to Nurse Triage and then click Send Message and Close CRM.

## 2024-10-21 NOTE — Telephone Encounter (Signed)
 Reviewed

## 2024-10-31 ENCOUNTER — Telehealth: Admitting: Physician Assistant

## 2024-10-31 ENCOUNTER — Encounter: Payer: Self-pay | Admitting: Physician Assistant

## 2024-10-31 VITALS — Ht 64.0 in | Wt 270.0 lb

## 2024-10-31 DIAGNOSIS — R052 Subacute cough: Secondary | ICD-10-CM

## 2024-10-31 DIAGNOSIS — E669 Obesity, unspecified: Secondary | ICD-10-CM | POA: Diagnosis not present

## 2024-10-31 MED ORDER — SCOPOLAMINE 1 MG/3DAYS TD PT72: 1.0000 | MEDICATED_PATCH | TRANSDERMAL | 1 refills | Status: AC

## 2024-10-31 MED ORDER — ZEPBOUND 7.5 MG/0.5ML ~~LOC~~ SOAJ: 7.5000 mg | SUBCUTANEOUS | 1 refills | Status: AC

## 2024-10-31 MED ORDER — FLUCONAZOLE 150 MG PO TABS: ORAL_TABLET | ORAL | 0 refills | Status: AC

## 2024-10-31 MED ORDER — DOXYCYCLINE HYCLATE 100 MG PO TABS: 100.0000 mg | ORAL_TABLET | Freq: Two times a day (BID) | ORAL | 0 refills | Status: AC

## 2024-10-31 MED ORDER — FLUTICASONE PROPIONATE 50 MCG/ACT NA SUSP: 2.0000 | Freq: Every day | NASAL | 1 refills | Status: AC

## 2024-10-31 MED ORDER — PREDNISONE 20 MG PO TABS: 40.0000 mg | ORAL_TABLET | Freq: Every day | ORAL | 0 refills | Status: AC

## 2024-10-31 NOTE — Progress Notes (Signed)
 "   Virtual Visit via Video Note   I, Lucie Buttner, connected with  Lisa Bolton  ([id number], 1986-08-14) on 10/31/24 at  8:40 AM EST by a video-enabled telemedicine application and verified that I am speaking with the correct person using two identifiers.  Location: Patient: Home Provider: [facility] office   I discussed the limitations of evaluation and management by telemedicine and the availability of in person appointments. The patient expressed understanding and agreed to proceed.    Discussed the use of AI scribe software for clinical note transcription with the patient, who gave verbal consent to proceed.  History of Present Illness   Lisa Bolton is a 39 year old female who presents with persistent cough and nasal congestion following a recent flu infection.  She developed sore throat, cough, and nasal congestion before Christmas. While in [facility] her symptoms persisted, then on return she developed fever, body aches, and congestion and tested positive for flu A but did not take Tamiflu  because she was outside the treatment window.  After the flu she had ongoing cough and was seen at urgent care on January 6, where chest x-ray was done and she was given azithromycin , a Medrol  Dosepak, and an albuterol  inhaler. Symptoms improved on these but within 24 hours of completing them she developed recurrent deep cough and worsening nasal congestion.  She takes Claritin daily and has used Afrin and saline nasal sprays. She is coughing up clear to pale yellow sputum and is most concerned about increasing nasal congestion and sinus pressure that started yesterday.  She notes a past tendency to pass out with overheating or stress and had an episode the morning of the urgent care visit but has not described recurrent episodes since. She has tolerated Augmentin  previously despite a family history of penicillin allergy . She is planning travel to [place] soon and wants to  ensure her respiratory symptoms are controlled for the trip.      She is down at least 40 pounds since starting Zepbound . She is tolerating medication. She has reduced appetite and exercising regularly. She is currently on 7.5 mg weekly dosage and tolerating well. Wt Readings from Last 3 Encounters:  10/31/24 270 lb (122.5 kg)  07/21/24 278 lb 6.1 oz (126.3 kg)  02/22/24 (!) 303 lb 4 oz (137.6 kg)     Problems:  Patient Active Problem List   Diagnosis Date Noted   Prediabetes 02/22/2024   Abnormal levels of other serum enzymes 03/29/2022   Chronic anal fissure 03/29/2022   Gastroesophageal reflux disease 03/29/2022   Vitamin D  insufficiency 02/17/2022   Acquired hypothyroidism 02/17/2022   Visceral fat 02/17/2022   Piriformis syndrome of left side 10/24/2020   Nonallopathic lesion of sacral region 07/23/2020   Nonallopathic lesion of lumbar region 07/23/2020   Nonallopathic lesion of thoracic region 07/23/2020   Hip flexor tightness, right 06/13/2020   Non-seasonal allergic rhinitis 08/20/2018   Papular urticaria 08/20/2018   GAD (generalized anxiety disorder) 06/20/2018   Morbid obesity (HCC) 06/20/2018    Allergies: Allergies[1] Medications: Current Medications[2]  Observations/Objective: Patient is well-developed, well-nourished in no acute distress.  Resting comfortably  at home.  Head is normocephalic, atraumatic.  No labored breathing.  Speech is clear and coherent with logical content.  Patient is alert and oriented at baseline.   Assessment and Plan    Cough, subacute Persistent cough and nasal congestion post-flu-like illness. Azithromycin  and Medrol  Dosepak provided temporary relief. Symptoms recurred with increased congestion and  productive cough. Differential includes sinusitis and bronchitis. Doxycycline  considered due to penicillin allergy  concerns. - Prescribed doxycycline  for sinusitis and bronchitis. - Advised saline nasal spray for nasal passage  priming. - Recommended Afrin for short-term congestion relief, max three days. - Suggested Flonase  for ongoing congestion management. - Advised guaifenesin (Mucinex) for expectoration. - Discussed steroid use if symptoms worsen while traveling. - Prescribed scopolamine  patch for travel motion sickness.  Obesity Managed with Zepbound  7.5 mg. Significant weight loss of nearly 40 pounds with good medication tolerance. - Refilled Zepbound  7.5 mg prescription through [facility] specialty pharmacy.        Follow Up Instructions: I discussed the assessment and treatment plan with the patient. The patient was provided an opportunity to ask questions and all were answered. The patient agreed with the plan and demonstrated an understanding of the instructions.  A copy of instructions were sent to the patient via MyChart unless otherwise noted below.   The patient was advised to call back or seek an in-person evaluation if the symptoms worsen or if the condition fails to improve as anticipated.  Lucie Buttner, PA    [1]  Allergies Allergen Reactions   Shellfish Allergy     Other Other (See Comments)    Anethesia   Pertussis Vaccines   [2]  Current Outpatient Medications:    albuterol  (VENTOLIN  HFA) 108 (90 Base) MCG/ACT inhaler, Inhale 1-2 puffs into the lungs every 6 (six) hours as needed for wheezing or shortness of breath., Disp: 8 g, Rfl: 0   clonazePAM  (KLONOPIN ) 0.5 MG tablet, Take 1 tablet (0.5 mg total) by mouth 2 (two) times daily as needed. for anxiety, Disp: 30 tablet, Rfl: 1   doxycycline  (VIBRA -TABS) 100 MG tablet, Take 1 tablet (100 mg total) by mouth 2 (two) times daily., Disp: 20 tablet, Rfl: 0   EPINEPHrine  0.3 mg/0.3 mL IJ SOAJ injection, INJECT AS DIRECTED FOR SEVERE ALLERGIC REACTION, Disp: 2 each, Rfl: 1   escitalopram  (LEXAPRO ) 10 MG tablet, Take 1 tablet (10 mg total) by mouth daily., Disp: 90 tablet, Rfl: 3   fluconazole  (DIFLUCAN ) 150 MG tablet, Take 1 tablet by mouth. If  symptoms persist, may take an additional tablet in 3-5 days. May repeat for a total of 3 tablets., Disp: 3 tablet, Rfl: 0   fluticasone  (FLONASE ) 50 MCG/ACT nasal spray, Place 2 sprays into both nostrils daily., Disp: 16 g, Rfl: 1   Lactic Ac-Citric Ac-Pot Bitart (PHEXXI ) 1.8-1-0.4 % GEL, Place 1 Applicatorful vaginally daily., Disp: 120 g, Rfl: 4   levothyroxine  (SYNTHROID ) 150 MCG tablet, Take 1 tablet (150 mcg total) by mouth in the morning on an empty stomach, Disp: 90 tablet, Rfl: 3   loratadine (CLARITIN) 10 MG tablet, Take 10 mg by mouth daily., Disp: , Rfl:    methocarbamol  (ROBAXIN ) 500 MG tablet, Take 1 tablet (500 mg total) by mouth every 8 (eight) hours as needed for muscle spasms., Disp: 30 tablet, Rfl: 0   metroNIDAZOLE  (METROGEL ) 1 % gel, Apply 1 Application topically to face daily. (Patient taking differently: Apply 1 Application topically as needed.), Disp: 60 g, Rfl: 3   Multiple Vitamin (MULTI-VITAMIN DAILY PO), Take 1 tablet by mouth daily in the afternoon., Disp: , Rfl:    ondansetron  (ZOFRAN -ODT) 4 MG disintegrating tablet, Take 4 mg by mouth every 8 (eight) hours as needed., Disp: , Rfl:    predniSONE  (DELTASONE ) 20 MG tablet, Take 2 tablets (40 mg total) by mouth daily with breakfast., Disp: 10 tablet, Rfl: 0  scopolamine  (TRANSDERM-SCOP) 1 MG/3DAYS, Place 1 patch (1 mg total) onto the skin every 3 (three) days., Disp: 10 patch, Rfl: 1   Ivermectin  1 % CREA, Apply 1 Application topically to affected areas on face daily. (Patient not taking: Reported on 07/21/2024), Disp: 45 g, Rfl: 0   tirzepatide  (ZEPBOUND ) 7.5 MG/0.5ML Pen, Inject 7.5 mg into the skin once a week., Disp: 2 mL, Rfl: 1  "

## 2025-02-22 ENCOUNTER — Encounter: Admitting: Physician Assistant
